# Patient Record
Sex: Male | Born: 1945 | Race: White | Hispanic: No | Marital: Married | Smoking: Never smoker
Health system: Southern US, Community
[De-identification: ages and names within clinical notes are randomized; demographics above are authoritative.]

## PROBLEM LIST (undated history)

## (undated) DIAGNOSIS — Z85828 Personal history of other malignant neoplasm of skin: Secondary | ICD-10-CM

## (undated) DIAGNOSIS — H269 Unspecified cataract: Secondary | ICD-10-CM

## (undated) DIAGNOSIS — K5730 Diverticulosis of large intestine without perforation or abscess without bleeding: Secondary | ICD-10-CM

## (undated) DIAGNOSIS — M0520 Rheumatoid vasculitis with rheumatoid arthritis of unspecified site: Secondary | ICD-10-CM

## (undated) DIAGNOSIS — L719 Rosacea, unspecified: Secondary | ICD-10-CM

## (undated) DIAGNOSIS — C61 Malignant neoplasm of prostate: Secondary | ICD-10-CM

## (undated) DIAGNOSIS — F419 Anxiety disorder, unspecified: Secondary | ICD-10-CM

## (undated) DIAGNOSIS — Z9889 Other specified postprocedural states: Secondary | ICD-10-CM

## (undated) DIAGNOSIS — E559 Vitamin D deficiency, unspecified: Secondary | ICD-10-CM

## (undated) DIAGNOSIS — K219 Gastro-esophageal reflux disease without esophagitis: Secondary | ICD-10-CM

## (undated) DIAGNOSIS — Z859 Personal history of malignant neoplasm, unspecified: Secondary | ICD-10-CM

## (undated) DIAGNOSIS — M069 Rheumatoid arthritis, unspecified: Secondary | ICD-10-CM

## (undated) DIAGNOSIS — I10 Essential (primary) hypertension: Secondary | ICD-10-CM

## (undated) DIAGNOSIS — R55 Syncope and collapse: Secondary | ICD-10-CM

## (undated) DIAGNOSIS — R351 Nocturia: Secondary | ICD-10-CM

## (undated) DIAGNOSIS — M1990 Unspecified osteoarthritis, unspecified site: Secondary | ICD-10-CM

## (undated) HISTORY — DX: Syncope and collapse: R55

## (undated) HISTORY — DX: Rosacea, unspecified: L71.9

## (undated) HISTORY — PX: MOHS SURGERY: SUR867

## (undated) HISTORY — DX: Unspecified cataract: H26.9

## (undated) HISTORY — DX: Anxiety disorder, unspecified: F41.9

## (undated) HISTORY — DX: Essential (primary) hypertension: I10

## (undated) HISTORY — DX: Rheumatoid vasculitis with rheumatoid arthritis of unspecified site: M05.20

## (undated) HISTORY — PX: TONSILLECTOMY: SUR1361

---

## 1982-11-01 HISTORY — PX: ACHILLES TENDON SURGERY: SHX542

## 1998-11-01 HISTORY — PX: VARICOSE VEIN SURGERY: SHX832

## 2005-10-13 ENCOUNTER — Ambulatory Visit: Payer: Self-pay | Admitting: Unknown Physician Specialty

## 2006-03-30 ENCOUNTER — Other Ambulatory Visit: Payer: Self-pay

## 2006-03-30 ENCOUNTER — Emergency Department: Payer: Self-pay | Admitting: Emergency Medicine

## 2006-11-01 HISTORY — PX: COLONOSCOPY: SHX174

## 2011-07-30 ENCOUNTER — Telehealth: Payer: Self-pay | Admitting: Internal Medicine

## 2011-07-30 NOTE — Telephone Encounter (Signed)
Fine to refill his Doxycycline at [facility].May have 6 refills.

## 2011-07-30 NOTE — Telephone Encounter (Signed)
Pt would like to get a refill on his med for [facility] pharmacy

## 2011-08-02 MED ORDER — DOXYCYCLINE HYCLATE 50 MG PO CAPS: 50.0000 mg | ORAL_CAPSULE | Freq: Two times a day (BID) | ORAL | Status: AC

## 2011-08-02 NOTE — Telephone Encounter (Signed)
Rx sent to pharmacy   

## 2011-08-31 ENCOUNTER — Encounter: Payer: Self-pay | Admitting: Internal Medicine

## 2011-09-09 ENCOUNTER — Other Ambulatory Visit: Payer: Self-pay | Admitting: *Deleted

## 2011-09-09 MED ORDER — FLUTICASONE PROPIONATE 50 MCG/ACT NA SUSP: 2.0000 | Freq: Every day | NASAL | Status: DC

## 2012-01-25 ENCOUNTER — Telehealth: Payer: Self-pay | Admitting: *Deleted

## 2012-01-25 NOTE — Telephone Encounter (Signed)
Pt had labs at [facility] faxed to Dr Dan Humphreys. Pt needs OV (30 min b/c he has not been seen here) set up to review labs. Nex 30 min avail is fine. THANKS

## 2012-01-25 NOTE — Telephone Encounter (Signed)
Left message on home number for pt to call office.  The cell phone is an incorrect number

## 2012-01-26 NOTE — Telephone Encounter (Signed)
Appointment 4/17 pt aware

## 2012-02-16 ENCOUNTER — Encounter: Payer: Self-pay | Admitting: Internal Medicine

## 2012-02-16 ENCOUNTER — Ambulatory Visit (INDEPENDENT_AMBULATORY_CARE_PROVIDER_SITE_OTHER): Payer: PRIVATE HEALTH INSURANCE | Admitting: Internal Medicine

## 2012-02-16 VITALS — BP 132/70 | HR 78 | Temp 98.6°F | Ht 69.0 in | Wt 205.0 lb

## 2012-02-16 DIAGNOSIS — I10 Essential (primary) hypertension: Secondary | ICD-10-CM | POA: Insufficient documentation

## 2012-02-16 DIAGNOSIS — E559 Vitamin D deficiency, unspecified: Secondary | ICD-10-CM

## 2012-02-16 DIAGNOSIS — M069 Rheumatoid arthritis, unspecified: Secondary | ICD-10-CM

## 2012-02-16 MED ORDER — METHOTREXATE POWD: Status: DC

## 2012-02-16 MED ORDER — FOLIC ACID 1 MG PO TABS: 1.0000 mg | ORAL_TABLET | Freq: Every day | ORAL | Status: DC

## 2012-02-16 MED ORDER — PREDNISONE 5 MG PO TABS: 5.0000 mg | ORAL_TABLET | Freq: Every day | ORAL | Status: AC

## 2012-02-16 MED ORDER — LOSARTAN POTASSIUM 100 MG PO TABS: 100.0000 mg | ORAL_TABLET | Freq: Every day | ORAL | Status: DC

## 2012-02-16 MED ORDER — ERGOCALCIFEROL 1.25 MG (50000 UT) PO CAPS: 50000.0000 [IU] | ORAL_CAPSULE | ORAL | Status: DC

## 2012-02-16 NOTE — Patient Instructions (Signed)
Start Vit D supplement weekly.  Repeat Vit D level in 3 months.  Follow up here in 6 months.

## 2012-02-16 NOTE — Assessment & Plan Note (Signed)
Blood pressure well-controlled today, especially considering that patient is taking prednisone. We'll continue to monitor. Patient is unsure dose of losartan, so will call with the updated medication list. Recent renal function was normal. Followup in 6 months.

## 2012-02-16 NOTE — Progress Notes (Signed)
Subjective:    Patient ID: Julian Pineda, male    DOB: 07/18/1946, 66 y.o.   MRN: [id number]  HPI  66 year old male with history of hypertension and recent diagnosis of rheumatoid arthritis presents for followup. In regards to his hypertension, he reports full compliance with his losartan. He denies any chest pain, headache, or other complaints. In regards to his recent diagnosis of rheumatoid arthritis, he notes he first developed right wrist pain and swelling after golfing. He was seen by orthopedics who referred him to rheumatology. He was noted to have markedly elevated rheumatoid factor and markers of inflammation. He was started on prednisone and methotrexate with full passive. He reports significant improvement in his symptoms with the medication changes. He also notes that recent lab work was improved with normal markers of inflammation and a reduction in rheumatoid factor. He denies any noted side effects of the medication. He notes normal energy level. He denies any new complaints today.  Outpatient Encounter Prescriptions as of 02/16/2012  Medication Sig Dispense Refill  . doxycycline (VIBRAMYCIN) 50 MG capsule Take 50 mg by mouth daily.      . fluticasone (FLONASE) 50 MCG/ACT nasal spray Place 2 sprays into the nose daily.  16 g  6  . ergocalciferol (DRISDOL) 50000 UNITS capsule Take 1 capsule (50,000 Units total) by mouth once a week.  4 capsule  12  . folic acid (FOLVITE) 1 MG tablet Take 1 tablet (1 mg total) by mouth daily.  30 tablet  3  . losartan (COZAAR) 100 MG tablet Take 1 tablet (100 mg total) by mouth daily.  90 tablet  3  . Methotrexate POWD As directed by rheumatologist (pt will call with dose) 0.99ml approx  1 g  0  . predniSONE (DELTASONE) 5 MG tablet Take 1 tablet (5 mg total) by mouth daily.  30 tablet  3     Review of Systems  Constitutional: Negative for fever, chills, activity change, appetite change, fatigue and unexpected weight change.  Eyes: Negative for  visual disturbance.  Respiratory: Negative for cough and shortness of breath.   Cardiovascular: Negative for chest pain, palpitations and leg swelling.  Gastrointestinal: Negative for abdominal pain and abdominal distention.  Genitourinary: Negative for dysuria, urgency and difficulty urinating.  Musculoskeletal: Positive for myalgias, joint swelling and arthralgias. Negative for gait problem.  Skin: Negative for color change and rash.  Hematological: Negative for adenopathy.  Psychiatric/Behavioral: Negative for sleep disturbance and dysphoric mood. The patient is not nervous/anxious.    BP 132/70  Pulse 78  Temp(Src) 98.6 F (37 C) (Oral)  Ht 5\' 9"  (1.753 m)  Wt 205 lb (92.987 kg)  BMI 30.27 kg/m2  SpO2 97%     Objective:   Physical Exam  Constitutional: He is oriented to person, place, and time. He appears well-developed and well-nourished. No distress.  HENT:  Head: Normocephalic and atraumatic.  Right Ear: External ear normal.  Left Ear: External ear normal.  Nose: Nose normal.  Mouth/Throat: Oropharynx is clear and moist. No oropharyngeal exudate.  Eyes: Conjunctivae and EOM are normal. Pupils are equal, round, and reactive to light. Right eye exhibits no discharge. Left eye exhibits no discharge. No scleral icterus.  Neck: Normal range of motion. Neck supple. No tracheal deviation present. No thyromegaly present.  Cardiovascular: Normal rate, regular rhythm and normal heart sounds.  Exam reveals no gallop and no friction rub.   No murmur heard. Pulmonary/Chest: Effort normal and breath sounds normal. No respiratory distress. He has no  wheezes. He has no rales. He exhibits no tenderness.  Abdominal: Soft. Bowel sounds are normal. He exhibits no distension. There is no tenderness.  Musculoskeletal: Normal range of motion. He exhibits no edema.  Lymphadenopathy:    He has no cervical adenopathy.  Neurological: He is alert and oriented to person, place, and time. No cranial  nerve deficit. Coordination normal.  Skin: Skin is warm and dry. No rash noted. He is not diaphoretic. No erythema. No pallor.  Psychiatric: He has a normal mood and affect. His behavior is normal. Judgment and thought content normal.          Assessment & Plan:

## 2012-02-16 NOTE — Assessment & Plan Note (Signed)
Vit D 11. Goal >20. Discussed risk of bone loss with deficiency. Will supplement Drisdol 50000IU weekly x 12 weeks, then repeat level.

## 2012-02-16 NOTE — Assessment & Plan Note (Signed)
Recent diagnosis. Followed by rheumatology. Symptoms and lab work is improved. Has followup with rheumatology next week. He will e-mail or call with update if any changes made to medications. Otherwise, continue methotrexate and folic acid. We discussed the potential risk of prednisone and it appears that he has been tapering down on this medication. Will request notes from his rheumatologist.

## 2012-02-22 ENCOUNTER — Other Ambulatory Visit: Payer: Self-pay | Admitting: *Deleted

## 2012-02-22 MED ORDER — DOXYCYCLINE HYCLATE 50 MG PO CAPS: 50.0000 mg | ORAL_CAPSULE | Freq: Every day | ORAL | Status: DC

## 2012-02-23 ENCOUNTER — Telehealth: Payer: Self-pay | Admitting: Internal Medicine

## 2012-02-23 NOTE — Telephone Encounter (Signed)
No, must have been in system incorrectly. There should be no change in medication. Please call in as previous.

## 2012-02-23 NOTE — Telephone Encounter (Signed)
We received a fax from the pharmacy stating patient has always been on 100 mg of doxycycline, but they received the refill for 50 mg tablets.  They wanted to know if the change was intentional because the patient was not aware.  Please advise.

## 2012-02-24 MED ORDER — DOXYCYCLINE HYCLATE 100 MG PO TABS: 100.0000 mg | ORAL_TABLET | Freq: Two times a day (BID) | ORAL | Status: AC

## 2012-02-24 NOTE — Telephone Encounter (Signed)
This has been changed at the pharmacy.

## 2012-04-19 ENCOUNTER — Other Ambulatory Visit: Payer: Self-pay | Admitting: Internal Medicine

## 2012-04-26 ENCOUNTER — Telehealth: Payer: Self-pay | Admitting: Internal Medicine

## 2012-04-26 NOTE — Telephone Encounter (Signed)
Patient advised as instructed via telephone. 

## 2012-04-26 NOTE — Telephone Encounter (Signed)
Labs were normal.( outside labs)

## 2012-05-26 ENCOUNTER — Encounter: Payer: Self-pay | Admitting: *Deleted

## 2012-08-18 ENCOUNTER — Telehealth: Payer: Self-pay | Admitting: *Deleted

## 2012-08-18 ENCOUNTER — Encounter: Payer: Self-pay | Admitting: Internal Medicine

## 2012-08-18 ENCOUNTER — Ambulatory Visit (INDEPENDENT_AMBULATORY_CARE_PROVIDER_SITE_OTHER): Payer: PRIVATE HEALTH INSURANCE | Admitting: Internal Medicine

## 2012-08-18 VITALS — BP 130/78 | HR 63 | Temp 98.4°F | Ht 70.0 in | Wt 203.0 lb

## 2012-08-18 DIAGNOSIS — E559 Vitamin D deficiency, unspecified: Secondary | ICD-10-CM

## 2012-08-18 DIAGNOSIS — M1712 Unilateral primary osteoarthritis, left knee: Secondary | ICD-10-CM

## 2012-08-18 DIAGNOSIS — M069 Rheumatoid arthritis, unspecified: Secondary | ICD-10-CM

## 2012-08-18 DIAGNOSIS — I10 Essential (primary) hypertension: Secondary | ICD-10-CM

## 2012-08-18 DIAGNOSIS — M1710 Unilateral primary osteoarthritis, unspecified knee: Secondary | ICD-10-CM

## 2012-08-18 NOTE — Assessment & Plan Note (Signed)
Will check Vitamin D level with labs. Follow up 6 months and prn.

## 2012-08-18 NOTE — Assessment & Plan Note (Signed)
Patient is planning to have knee replacement later this year. Will request records from orthopedic surgeon.

## 2012-08-18 NOTE — Progress Notes (Signed)
Subjective:    Patient ID: Julian Pineda, male    DOB: 1945/12/17, 66 y.o.   MRN: [id number]  HPI 66 year old male with history of rheumatoid arthritis, hypertension, vitamin D deficiency presents for followup. He reports he is doing well. He notes that his rheumatologist has increased his dose of methotrexate. He feels he is tolerating this well. He is having blood counts checked quarterly. He notes some joint stiffness in his hands and decreased grip strength in his right hand but he has been able to participate in activities he enjoys such as golfing. He denies any side effects from medication.   In regards to hypertension, he does not regularly check his blood pressure but denies any headache, palpitations, chest pain. He reports compliance with his losartan.  In regards to vitamin D deficiency, he reports he continues to take weekly high-dose vitamin D. He denies any side effects from this.  He notes that he has recently had some pain in his left knee. This was evaluated by orthopedic surgeon and he was diagnosed with osteoarthritis. Plan is for knee replacement later this year. He also notes that he has a history of torn cartilage in his right knee. Plan is for arthroscopy later this year.  Outpatient Encounter Prescriptions as of 08/18/2012  Medication Sig Dispense Refill  . doxycycline (MONODOX) 100 MG capsule TAKE ONE CAPSULE DAILY  30 capsule  11  . ergocalciferol (DRISDOL) 50000 UNITS capsule Take 1 capsule (50,000 Units total) by mouth once a week.  4 capsule  12  . fluticasone (FLONASE) 50 MCG/ACT nasal spray USE TWO PUFFS INTO EACH NOSTRIL DAILY  16 g  11  . folic acid (FOLVITE) 1 MG tablet Take 1 tablet (1 mg total) by mouth daily.  30 tablet  3  . losartan (COZAAR) 100 MG tablet Take 1 tablet (100 mg total) by mouth daily.  90 tablet  3  . Methotrexate POWD As directed by rheumatologist (pt will call with dose) 0.80ml approx  1 g  0   BP 130/78  Pulse 63  Temp 98.4 F (36.9 C)   Ht 5\' 10"  (1.778 m)  Wt 203 lb (92.08 kg)  BMI 29.13 kg/m2  SpO2 98%  Review of Systems  Constitutional: Negative for fever, chills, activity change, appetite change, fatigue and unexpected weight change.  Eyes: Negative for visual disturbance.  Respiratory: Negative for cough and shortness of breath.   Cardiovascular: Negative for chest pain, palpitations and leg swelling.  Gastrointestinal: Negative for abdominal pain and abdominal distention.  Genitourinary: Negative for dysuria, urgency and difficulty urinating.  Musculoskeletal: Positive for joint swelling and arthralgias. Negative for gait problem.  Skin: Negative for color change and rash.  Neurological: Positive for weakness.  Hematological: Negative for adenopathy.  Psychiatric/Behavioral: Negative for disturbed wake/sleep cycle and dysphoric mood. The patient is not nervous/anxious.        Objective:   Physical Exam  Constitutional: He is oriented to person, place, and time. He appears well-developed and well-nourished. No distress.  HENT:  Head: Normocephalic and atraumatic.  Right Ear: External ear normal.  Left Ear: External ear normal.  Nose: Nose normal.  Mouth/Throat: Oropharynx is clear and moist. No oropharyngeal exudate.  Eyes: Conjunctivae normal and EOM are normal. Pupils are equal, round, and reactive to light. Right eye exhibits no discharge. Left eye exhibits no discharge. No scleral icterus.  Neck: Normal range of motion. Neck supple. No tracheal deviation present. No thyromegaly present.  Cardiovascular: Normal rate, regular rhythm and normal  heart sounds.  Exam reveals no gallop and no friction rub.   No murmur heard. Pulmonary/Chest: Effort normal and breath sounds normal. No respiratory distress. He has no wheezes. He has no rales. He exhibits no tenderness.  Musculoskeletal: He exhibits no edema.       Right knee: He exhibits normal range of motion and no swelling.       Left knee: He exhibits  decreased range of motion.       Right hand: He exhibits decreased range of motion.  Lymphadenopathy:    He has no cervical adenopathy.  Neurological: He is alert and oriented to person, place, and time. No cranial nerve deficit. Coordination normal.  Skin: Skin is warm and dry. No rash noted. He is not diaphoretic. No erythema. No pallor.  Psychiatric: He has a normal mood and affect. His behavior is normal. Judgment and thought content normal.          Assessment & Plan:

## 2012-08-18 NOTE — Assessment & Plan Note (Signed)
Symptoms well controlled with methotrexate.  Will request notes from rheumatologist. Follow up here in 6 months and prn.

## 2012-08-18 NOTE — Assessment & Plan Note (Signed)
BP well controlled on losartan. Will check renal function with next labs through his employer. Follow up in 6 months and prn.

## 2012-08-18 NOTE — Telephone Encounter (Signed)
Called pt to let him no that his blood specimen had been discarded and that he can have new labs on his next visit.

## 2012-11-10 ENCOUNTER — Encounter: Payer: Self-pay | Admitting: Internal Medicine

## 2012-11-10 ENCOUNTER — Ambulatory Visit (INDEPENDENT_AMBULATORY_CARE_PROVIDER_SITE_OTHER): Payer: BC Managed Care – PPO | Admitting: Internal Medicine

## 2012-11-10 VITALS — BP 142/88 | HR 80 | Temp 99.1°F | Ht 70.0 in | Wt 208.2 lb

## 2012-11-10 DIAGNOSIS — M1710 Unilateral primary osteoarthritis, unspecified knee: Secondary | ICD-10-CM

## 2012-11-10 DIAGNOSIS — Z01818 Encounter for other preprocedural examination: Secondary | ICD-10-CM | POA: Insufficient documentation

## 2012-11-10 DIAGNOSIS — M1712 Unilateral primary osteoarthritis, left knee: Secondary | ICD-10-CM

## 2012-11-10 LAB — COMPREHENSIVE METABOLIC PANEL
ALT: 24 U/L (ref 0–53)
AST: 20 U/L (ref 0–37)
Albumin: 4.3 g/dL (ref 3.5–5.2)
BUN: 15 mg/dL (ref 6–23)
Calcium: 9.5 mg/dL (ref 8.4–10.5)
Chloride: 100 mEq/L (ref 96–112)
Potassium: 4 mEq/L (ref 3.5–5.3)
Sodium: 139 mEq/L (ref 135–145)
Total Protein: 6.1 g/dL (ref 6.0–8.3)

## 2012-11-10 LAB — CBC WITH DIFFERENTIAL/PLATELET
Basophils Absolute: 0 10*3/uL (ref 0.0–0.1)
Basophils Relative: 0 % (ref 0–1)
Eosinophils Relative: 1 % (ref 0–5)
HCT: 41.7 % (ref 39.0–52.0)
MCHC: 35.3 g/dL (ref 30.0–36.0)
MCV: 89.5 fL (ref 78.0–100.0)
Monocytes Absolute: 0.4 10*3/uL (ref 0.1–1.0)
RDW: 14.6 % (ref 11.5–15.5)

## 2012-11-10 NOTE — Assessment & Plan Note (Signed)
Low risk of perioperative cardiac events based on Modified Risk Index.  EKG normal today. Lab including CBC, CMP, coags pending. If labs normal, will recommend proceed with surgery without additional testing.

## 2012-11-10 NOTE — Progress Notes (Signed)
Subjective:    Patient ID: Julian Pineda, male    DOB: 01/29/1946, 67 y.o.   MRN: [id number]  HPI 67 year old male with history of rheumatoid arthritis and osteoarthritis as well as hypertension presents for preoperative clearance. He is scheduled to have left knee replacement later this month. He recently underwent left knee arthroscopy and tolerated this procedure well. He has never had problems with anesthesia. He denies any recent infections. He denies any recent chest pain or shortness of breath. He reports normal activity level. He has never had issues of bleeding or bruising.  Outpatient Encounter Prescriptions as of 11/10/2012  Medication Sig Dispense Refill  . doxycycline (MONODOX) 100 MG capsule TAKE ONE CAPSULE DAILY  30 capsule  11  . ergocalciferol (DRISDOL) 50000 UNITS capsule Take 1 capsule (50,000 Units total) by mouth once a week.  4 capsule  12  . fluticasone (FLONASE) 50 MCG/ACT nasal spray USE TWO PUFFS INTO EACH NOSTRIL DAILY  16 g  11  . folic acid (FOLVITE) 1 MG tablet Take 1 tablet (1 mg total) by mouth daily.  30 tablet  3  . losartan (COZAAR) 100 MG tablet Take 1 tablet (100 mg total) by mouth daily.  90 tablet  3  . Methotrexate POWD As directed by rheumatologist (pt will call with dose) 0.18ml approx  1 g  0   BP 142/88  Pulse 80  Temp 99.1 F (37.3 C) (Oral)  Ht 5\' 10"  (1.778 m)  Wt 208 lb 4 oz (94.462 kg)  BMI 29.88 kg/m2  SpO2 95%  Review of Systems  Constitutional: Negative for fever, chills, activity change, appetite change, fatigue and unexpected weight change.  Eyes: Negative for visual disturbance.  Respiratory: Negative for cough and shortness of breath.   Cardiovascular: Negative for chest pain, palpitations and leg swelling.  Gastrointestinal: Negative for abdominal pain and abdominal distention.  Genitourinary: Negative for dysuria, urgency and difficulty urinating.  Musculoskeletal: Negative for arthralgias and gait problem.  Skin: Negative for  color change and rash.  Hematological: Negative for adenopathy.  Psychiatric/Behavioral: Negative for sleep disturbance and dysphoric mood. The patient is not nervous/anxious.        Objective:   Physical Exam  Constitutional: He is oriented to person, place, and time. He appears well-developed and well-nourished. No distress.  HENT:  Head: Normocephalic and atraumatic.  Right Ear: External ear normal.  Left Ear: External ear normal.  Nose: Nose normal.  Mouth/Throat: Oropharynx is clear and moist. No oropharyngeal exudate.  Eyes: Conjunctivae normal and EOM are normal. Pupils are equal, round, and reactive to light. Right eye exhibits no discharge. Left eye exhibits no discharge. No scleral icterus.  Neck: Normal range of motion. Neck supple. No tracheal deviation present. No thyromegaly present.  Cardiovascular: Normal rate, regular rhythm and normal heart sounds.  Exam reveals no gallop and no friction rub.   No murmur heard. Pulmonary/Chest: Effort normal and breath sounds normal. No respiratory distress. He has no wheezes. He has no rales. He exhibits no tenderness.  Musculoskeletal: Normal range of motion. He exhibits no edema.  Lymphadenopathy:    He has no cervical adenopathy.  Neurological: He is alert and oriented to person, place, and time. No cranial nerve deficit. Coordination normal.  Skin: Skin is warm and dry. No rash noted. He is not diaphoretic. No erythema. No pallor.  Psychiatric: He has a normal mood and affect. His behavior is normal. Judgment and thought content normal.  Assessment & Plan:

## 2012-11-10 NOTE — Assessment & Plan Note (Signed)
OA of left knee. Plan for left knee replacement later this month.

## 2012-11-23 ENCOUNTER — Encounter (HOSPITAL_COMMUNITY): Payer: Self-pay | Admitting: Pharmacy Technician

## 2012-11-24 ENCOUNTER — Other Ambulatory Visit: Payer: Self-pay | Admitting: Orthopedic Surgery

## 2012-11-27 ENCOUNTER — Ambulatory Visit: Payer: PRIVATE HEALTH INSURANCE | Admitting: Internal Medicine

## 2012-11-29 ENCOUNTER — Ambulatory Visit (HOSPITAL_COMMUNITY)
Admission: RE | Admit: 2012-11-29 | Discharge: 2012-11-29 | Disposition: A | Discharge status: Home / Self Care | Payer: BC Managed Care – PPO | Source: Ambulatory Visit | Attending: Orthopedic Surgery | Admitting: Orthopedic Surgery

## 2012-11-29 ENCOUNTER — Encounter (HOSPITAL_COMMUNITY): Payer: Self-pay

## 2012-11-29 ENCOUNTER — Encounter (HOSPITAL_COMMUNITY)
Admission: RE | Admit: 2012-11-29 | Discharge: 2012-11-29 | Disposition: A | Discharge status: Home / Self Care | Payer: BC Managed Care – PPO | Source: Ambulatory Visit | Attending: Orthopedic Surgery | Admitting: Orthopedic Surgery

## 2012-11-29 HISTORY — DX: Gastro-esophageal reflux disease without esophagitis: K21.9

## 2012-11-29 LAB — COMPREHENSIVE METABOLIC PANEL
Alkaline Phosphatase: 57 U/L (ref 39–117)
BUN: 15 mg/dL (ref 6–23)
Chloride: 102 mEq/L (ref 96–112)
Creatinine, Ser: 0.9 mg/dL (ref 0.50–1.35)
GFR calc Af Amer: >90 mL/min (ref >90)
Glucose, Bld: 101 mg/dL — ABNORMAL HIGH (ref 70–99)
Potassium: 4.1 mEq/L (ref 3.5–5.1)
Total Bilirubin: 0.4 mg/dL (ref 0.3–1.2)

## 2012-11-29 LAB — CBC WITH DIFFERENTIAL/PLATELET
Eosinophils Absolute: 0.2 10*3/uL (ref 0.0–0.7)
HCT: 40.4 % (ref 39.0–52.0)
Hemoglobin: 13.9 g/dL (ref 13.0–17.0)
Lymphs Abs: 1 10*3/uL (ref 0.7–4.0)
MCH: 31.7 pg (ref 26.0–34.0)
MCHC: 34.4 g/dL (ref 30.0–36.0)
Monocytes Absolute: 0.4 10*3/uL (ref 0.1–1.0)
Monocytes Relative: 7 % (ref 3–12)
Neutrophils Relative %: 72 % (ref 43–77)
RBC: 4.39 MIL/uL (ref 4.22–5.81)

## 2012-11-29 LAB — URINALYSIS, ROUTINE W REFLEX MICROSCOPIC
Bilirubin Urine: NEGATIVE
Hgb urine dipstick: NEGATIVE
Ketones, ur: NEGATIVE mg/dL
Protein, ur: NEGATIVE mg/dL
Urobilinogen, UA: 0.2 mg/dL (ref 0.0–1.0)

## 2012-11-29 LAB — TYPE AND SCREEN: ABO/RH(D): A POS

## 2012-11-29 LAB — PROTIME-INR
INR: 0.94 (ref 0.00–1.49)
Prothrombin Time: 12.5 seconds (ref 11.6–15.2)

## 2012-11-29 LAB — SURGICAL PCR SCREEN
MRSA, PCR: NEGATIVE
Staphylococcus aureus: NEGATIVE

## 2012-11-29 MED ORDER — CHLORHEXIDINE GLUCONATE 4 % EX LIQD: 60.0000 mL | Freq: Once | CUTANEOUS | Status: DC

## 2012-11-29 NOTE — Pre-Procedure Instructions (Signed)
Julian Pineda  11/29/2012   Your procedure is scheduled on:  Monday December 04, 2012  Report to [facility] at 5:30 AM.  Call this number if you have problems the morning of surgery: [phone]   Remember:   Do not eat food or drink liquids after midnight.   Take these medicines the morning of surgery with A SIP OF WATER: doxycycline, flonase,    Do not wear jewelry, make-up or nail polish.  Do not wear lotions, powders, or perfumes.  Do not shave 48 hours prior to surgery. Men may shave face and neck.  Do not bring valuables to the hospital.  Contacts, dentures or bridgework may not be worn into surgery.  Leave suitcase in the car. After surgery it may be brought to your room.  For patients admitted to the hospital, checkout time is 11:00 AM the day of  discharge.   Patients discharged the day of surgery will not be allowed to drive  home.  Name and phone number of your driver: family / friend  Special Instructions: Shower using CHG 2 nights before surgery and the night before surgery.  If you shower the day of surgery use CHG.  Use special wash - you have one bottle of CHG for all showers.  You should use approximately 1/3 of the bottle for each shower.   Please read over the following fact sheets that you were given: Pain Booklet, Coughing and Deep Breathing, Blood Transfusion Information, Total Joint Packet, MRSA Information and Surgical Site Infection Prevention

## 2012-11-30 LAB — URINE CULTURE: Culture: NO GROWTH

## 2012-12-03 MED ORDER — VANCOMYCIN HCL IN DEXTROSE 1-5 GM/200ML-% IV SOLN
1000.0000 mg | INTRAVENOUS | Status: AC
  Administered 2012-12-04: 1000 mg via INTRAVENOUS
  Filled 2012-12-03: qty 200

## 2012-12-04 ENCOUNTER — Encounter (HOSPITAL_COMMUNITY): Payer: Self-pay | Admitting: Certified Registered"

## 2012-12-04 ENCOUNTER — Inpatient Hospital Stay (HOSPITAL_COMMUNITY): Payer: BC Managed Care – PPO | Admitting: Certified Registered"

## 2012-12-04 ENCOUNTER — Inpatient Hospital Stay (HOSPITAL_COMMUNITY)
Admission: RE | Admit: 2012-12-04 | Discharge: 2012-12-05 | DRG: 209 | Disposition: A | Discharge status: Home Health | Payer: BC Managed Care – PPO | Source: Ambulatory Visit | Attending: Orthopedic Surgery | Admitting: Orthopedic Surgery

## 2012-12-04 ENCOUNTER — Encounter (HOSPITAL_COMMUNITY): Payer: Self-pay

## 2012-12-04 ENCOUNTER — Encounter (HOSPITAL_COMMUNITY)
Admission: RE | Disposition: A | Discharge status: Home Health | Payer: Self-pay | Source: Ambulatory Visit | Attending: Orthopedic Surgery

## 2012-12-04 DIAGNOSIS — Z01812 Encounter for preprocedural laboratory examination: Secondary | ICD-10-CM

## 2012-12-04 DIAGNOSIS — D62 Acute posthemorrhagic anemia: Secondary | ICD-10-CM | POA: Diagnosis not present

## 2012-12-04 DIAGNOSIS — Z79899 Other long term (current) drug therapy: Secondary | ICD-10-CM

## 2012-12-04 DIAGNOSIS — K219 Gastro-esophageal reflux disease without esophagitis: Secondary | ICD-10-CM | POA: Diagnosis present

## 2012-12-04 DIAGNOSIS — E559 Vitamin D deficiency, unspecified: Secondary | ICD-10-CM | POA: Diagnosis present

## 2012-12-04 DIAGNOSIS — M1710 Unilateral primary osteoarthritis, unspecified knee: Principal | ICD-10-CM | POA: Diagnosis present

## 2012-12-04 DIAGNOSIS — I10 Essential (primary) hypertension: Secondary | ICD-10-CM | POA: Diagnosis present

## 2012-12-04 DIAGNOSIS — M069 Rheumatoid arthritis, unspecified: Secondary | ICD-10-CM | POA: Diagnosis present

## 2012-12-04 DIAGNOSIS — M1712 Unilateral primary osteoarthritis, left knee: Secondary | ICD-10-CM

## 2012-12-04 DIAGNOSIS — Z85828 Personal history of other malignant neoplasm of skin: Secondary | ICD-10-CM

## 2012-12-04 DIAGNOSIS — Z01811 Encounter for preprocedural respiratory examination: Secondary | ICD-10-CM

## 2012-12-04 HISTORY — PX: TOTAL KNEE ARTHROPLASTY: SHX125

## 2012-12-04 LAB — CBC
MCH: 31.6 pg (ref 26.0–34.0)
MCV: 92 fL (ref 78.0–100.0)
Platelets: 261 10*3/uL (ref 150–400)
RBC: 4.15 MIL/uL — ABNORMAL LOW (ref 4.22–5.81)

## 2012-12-04 SURGERY — ARTHROPLASTY, KNEE, TOTAL
Anesthesia: General | Site: Knee | Laterality: Left | Wound class: Clean

## 2012-12-04 MED ORDER — PHENOL 1.4 % MT LIQD: 1.0000 | OROMUCOSAL | Status: DC | PRN

## 2012-12-04 MED ORDER — ALUM & MAG HYDROXIDE-SIMETH 200-200-20 MG/5ML PO SUSP: 30.0000 mL | ORAL | Status: DC | PRN

## 2012-12-04 MED ORDER — ACETAMINOPHEN 10 MG/ML IV SOLN
INTRAVENOUS | Status: AC
  Filled 2012-12-04: qty 100

## 2012-12-04 MED ORDER — HYDROMORPHONE HCL PF 1 MG/ML IJ SOLN
0.2500 mg | INTRAMUSCULAR | Status: DC | PRN
  Administered 2012-12-04 (×3): 0.5 mg via INTRAVENOUS

## 2012-12-04 MED ORDER — OXYCODONE HCL 5 MG/5ML PO SOLN: 5.0000 mg | Freq: Once | ORAL | Status: DC | PRN

## 2012-12-04 MED ORDER — SODIUM CHLORIDE 0.9 % IV SOLN: INTRAVENOUS | Status: DC

## 2012-12-04 MED ORDER — DIPHENHYDRAMINE HCL 12.5 MG/5ML PO ELIX: 12.5000 mg | ORAL_SOLUTION | ORAL | Status: DC | PRN

## 2012-12-04 MED ORDER — BISACODYL 5 MG PO TBEC: 5.0000 mg | DELAYED_RELEASE_TABLET | Freq: Every day | ORAL | Status: DC | PRN

## 2012-12-04 MED ORDER — BUPIVACAINE-EPINEPHRINE 0.25% -1:200000 IJ SOLN
INTRAMUSCULAR | Status: DC | PRN
  Administered 2012-12-04: 20 mL

## 2012-12-04 MED ORDER — ACETAMINOPHEN 325 MG PO TABS: 650.0000 mg | ORAL_TABLET | Freq: Four times a day (QID) | ORAL | Status: DC | PRN

## 2012-12-04 MED ORDER — HYDROMORPHONE HCL PF 1 MG/ML IJ SOLN: 0.5000 mg | INTRAMUSCULAR | Status: DC | PRN

## 2012-12-04 MED ORDER — BUPIVACAINE 0.25 % ON-Q PUMP DUAL CATH 300 ML
300.0000 mL | INJECTION | Status: DC
  Filled 2012-12-04: qty 300

## 2012-12-04 MED ORDER — METOCLOPRAMIDE HCL 10 MG PO TABS: 5.0000 mg | ORAL_TABLET | Freq: Three times a day (TID) | ORAL | Status: DC | PRN

## 2012-12-04 MED ORDER — OXYCODONE HCL 5 MG PO TABS: 5.0000 mg | ORAL_TABLET | Freq: Once | ORAL | Status: DC | PRN

## 2012-12-04 MED ORDER — BUPIVACAINE 0.25 % ON-Q PUMP SINGLE CATH 300ML
INJECTION | Status: DC | PRN
  Administered 2012-12-04: 300 mL

## 2012-12-04 MED ORDER — ACETAMINOPHEN 10 MG/ML IV SOLN
1000.0000 mg | Freq: Four times a day (QID) | INTRAVENOUS | Status: AC
  Administered 2012-12-04 – 2012-12-05 (×4): 1000 mg via INTRAVENOUS
  Filled 2012-12-04 (×4): qty 100

## 2012-12-04 MED ORDER — CEFAZOLIN SODIUM-DEXTROSE 2-3 GM-% IV SOLR
2.0000 g | Freq: Four times a day (QID) | INTRAVENOUS | Status: AC
  Administered 2012-12-04 (×2): 2 g via INTRAVENOUS
  Filled 2012-12-04 (×3): qty 50

## 2012-12-04 MED ORDER — METOCLOPRAMIDE HCL 5 MG/ML IJ SOLN: 5.0000 mg | Freq: Three times a day (TID) | INTRAMUSCULAR | Status: DC | PRN

## 2012-12-04 MED ORDER — ONDANSETRON HCL 4 MG/2ML IJ SOLN: 4.0000 mg | Freq: Four times a day (QID) | INTRAMUSCULAR | Status: DC | PRN

## 2012-12-04 MED ORDER — DOXYCYCLINE HYCLATE 100 MG PO TABS
100.0000 mg | ORAL_TABLET | Freq: Every day | ORAL | Status: DC
  Administered 2012-12-04 – 2012-12-05 (×2): 100 mg via ORAL
  Filled 2012-12-04 (×2): qty 1

## 2012-12-04 MED ORDER — DOCUSATE SODIUM 100 MG PO CAPS
100.0000 mg | ORAL_CAPSULE | Freq: Two times a day (BID) | ORAL | Status: DC
  Administered 2012-12-04 – 2012-12-05 (×3): 100 mg via ORAL
  Filled 2012-12-04 (×4): qty 1

## 2012-12-04 MED ORDER — BUPIVACAINE HCL (PF) 0.5 % IJ SOLN
INTRAMUSCULAR | Status: DC | PRN
  Administered 2012-12-04: 150 mg

## 2012-12-04 MED ORDER — METHOCARBAMOL 500 MG PO TABS
500.0000 mg | ORAL_TABLET | Freq: Four times a day (QID) | ORAL | Status: DC | PRN
  Filled 2012-12-04: qty 1

## 2012-12-04 MED ORDER — HYDROMORPHONE HCL PF 1 MG/ML IJ SOLN
INTRAMUSCULAR | Status: AC
  Filled 2012-12-04: qty 1

## 2012-12-04 MED ORDER — METHOCARBAMOL 100 MG/ML IJ SOLN
500.0000 mg | Freq: Four times a day (QID) | INTRAVENOUS | Status: DC | PRN
  Administered 2012-12-04: 500 mg via INTRAVENOUS
  Filled 2012-12-04: qty 5

## 2012-12-04 MED ORDER — DEXAMETHASONE SODIUM PHOSPHATE 4 MG/ML IJ SOLN
INTRAMUSCULAR | Status: DC | PRN
  Administered 2012-12-04: 10 mg via INTRAVENOUS

## 2012-12-04 MED ORDER — SODIUM CHLORIDE 0.9 % IR SOLN
Status: DC | PRN
  Administered 2012-12-04: 3000 mL

## 2012-12-04 MED ORDER — PROPOFOL 10 MG/ML IV BOLUS
INTRAVENOUS | Status: DC | PRN
  Administered 2012-12-04: 200 mg via INTRAVENOUS

## 2012-12-04 MED ORDER — ACETAMINOPHEN 650 MG RE SUPP: 650.0000 mg | Freq: Four times a day (QID) | RECTAL | Status: DC | PRN

## 2012-12-04 MED ORDER — BUPIVACAINE-EPINEPHRINE PF 0.25-1:200000 % IJ SOLN
INTRAMUSCULAR | Status: AC
  Filled 2012-12-04: qty 30

## 2012-12-04 MED ORDER — CALCIUM CARBONATE ANTACID 500 MG PO CHEW
2.0000 | CHEWABLE_TABLET | Freq: Two times a day (BID) | ORAL | Status: DC | PRN
  Filled 2012-12-04: qty 2

## 2012-12-04 MED ORDER — LOSARTAN POTASSIUM 50 MG PO TABS
100.0000 mg | ORAL_TABLET | Freq: Every day | ORAL | Status: DC
  Administered 2012-12-04 – 2012-12-05 (×2): 100 mg via ORAL
  Filled 2012-12-04 (×3): qty 2

## 2012-12-04 MED ORDER — BUPIVACAINE 0.25 % ON-Q PUMP SINGLE CATH 300ML
300.0000 mL | INJECTION | Status: DC
  Filled 2012-12-04: qty 300

## 2012-12-04 MED ORDER — ONDANSETRON HCL 4 MG PO TABS: 4.0000 mg | ORAL_TABLET | Freq: Four times a day (QID) | ORAL | Status: DC | PRN

## 2012-12-04 MED ORDER — SUFENTANIL CITRATE 50 MCG/ML IV SOLN
INTRAVENOUS | Status: DC | PRN
  Administered 2012-12-04: 5 ug via INTRAVENOUS
  Administered 2012-12-04 (×2): 10 ug via INTRAVENOUS

## 2012-12-04 MED ORDER — MENTHOL 3 MG MT LOZG: 1.0000 | LOZENGE | OROMUCOSAL | Status: DC | PRN

## 2012-12-04 MED ORDER — FLUTICASONE PROPIONATE 50 MCG/ACT NA SUSP
2.0000 | Freq: Every day | NASAL | Status: DC | PRN
  Filled 2012-12-04: qty 16

## 2012-12-04 MED ORDER — LACTATED RINGERS IV SOLN
INTRAVENOUS | Status: DC | PRN
  Administered 2012-12-04 (×2): via INTRAVENOUS

## 2012-12-04 MED ORDER — PROMETHAZINE HCL 25 MG/ML IJ SOLN: 6.2500 mg | INTRAMUSCULAR | Status: DC | PRN

## 2012-12-04 MED ORDER — ACETAMINOPHEN 10 MG/ML IV SOLN
1000.0000 mg | Freq: Four times a day (QID) | INTRAVENOUS | Status: DC
  Administered 2012-12-04: 1000 mg via INTRAVENOUS
  Filled 2012-12-04 (×3): qty 100

## 2012-12-04 MED ORDER — ZOLPIDEM TARTRATE 5 MG PO TABS: 5.0000 mg | ORAL_TABLET | Freq: Every evening | ORAL | Status: DC | PRN

## 2012-12-04 MED ORDER — OXYCODONE HCL ER 10 MG PO T12A
20.0000 mg | EXTENDED_RELEASE_TABLET | Freq: Two times a day (BID) | ORAL | Status: DC
  Administered 2012-12-04 – 2012-12-05 (×3): 20 mg via ORAL
  Filled 2012-12-04 (×3): qty 2

## 2012-12-04 MED ORDER — ENOXAPARIN SODIUM 30 MG/0.3ML ~~LOC~~ SOLN
30.0000 mg | Freq: Two times a day (BID) | SUBCUTANEOUS | Status: DC
  Administered 2012-12-05: 30 mg via SUBCUTANEOUS
  Filled 2012-12-04 (×3): qty 0.3

## 2012-12-04 MED ORDER — MIDAZOLAM HCL 5 MG/5ML IJ SOLN
INTRAMUSCULAR | Status: DC | PRN
  Administered 2012-12-04: 2 mg via INTRAVENOUS

## 2012-12-04 MED ORDER — 0.9 % SODIUM CHLORIDE (POUR BTL) OPTIME
TOPICAL | Status: DC | PRN
  Administered 2012-12-04: 1000 mL

## 2012-12-04 MED ORDER — OXYCODONE HCL 5 MG PO TABS: 5.0000 mg | ORAL_TABLET | ORAL | Status: DC | PRN

## 2012-12-04 MED ORDER — CELECOXIB 200 MG PO CAPS
200.0000 mg | ORAL_CAPSULE | Freq: Two times a day (BID) | ORAL | Status: DC
  Administered 2012-12-04 – 2012-12-05 (×3): 200 mg via ORAL
  Filled 2012-12-04 (×5): qty 1

## 2012-12-04 SURGICAL SUPPLY — 60 items
BANDAGE ELASTIC 6 VELCRO ST LF (GAUZE/BANDAGES/DRESSINGS) ×2 IMPLANT
BANDAGE ESMARK 6X9 LF (GAUZE/BANDAGES/DRESSINGS) ×1 IMPLANT
BLADE SAGITTAL 13X1.27X60 (BLADE) ×2 IMPLANT
BLADE SAW SGTL 83.5X18.5 (BLADE) ×2 IMPLANT
BNDG ESMARK 6X9 LF (GAUZE/BANDAGES/DRESSINGS) ×2
BOWL SMART MIX CTS (DISPOSABLE) ×2 IMPLANT
CATH KIT ON Q 5IN SLV (PAIN MANAGEMENT) ×2 IMPLANT
CEMENT BONE SIMPLEX SPEEDSET (Cement) ×4 IMPLANT
CLOTH BEACON ORANGE TIMEOUT ST (SAFETY) ×2 IMPLANT
COVER BACK TABLE 24X17X13 BIG (DRAPES) ×2 IMPLANT
COVER SURGICAL LIGHT HANDLE (MISCELLANEOUS) ×2 IMPLANT
CUFF TOURNIQUET SINGLE 34IN LL (TOURNIQUET CUFF) ×2 IMPLANT
DRAPE EXTREMITY T 121X128X90 (DRAPE) ×2 IMPLANT
DRAPE INCISE IOBAN 66X45 STRL (DRAPES) ×4 IMPLANT
DRAPE PROXIMA HALF (DRAPES) ×2 IMPLANT
DRAPE U-SHAPE 47X51 STRL (DRAPES) ×2 IMPLANT
DRSG ADAPTIC 3X8 NADH LF (GAUZE/BANDAGES/DRESSINGS) ×2 IMPLANT
DRSG PAD ABDOMINAL 8X10 ST (GAUZE/BANDAGES/DRESSINGS) ×2 IMPLANT
DURAPREP 26ML APPLICATOR (WOUND CARE) ×4 IMPLANT
ELECT REM PT RETURN 9FT ADLT (ELECTROSURGICAL) ×2
ELECTRODE REM PT RTRN 9FT ADLT (ELECTROSURGICAL) ×1 IMPLANT
EVACUATOR 1/8 PVC DRAIN (DRAIN) ×2 IMPLANT
GLOVE BIOGEL M 7.0 STRL (GLOVE) ×2 IMPLANT
GLOVE BIOGEL PI IND STRL 7.5 (GLOVE) ×1 IMPLANT
GLOVE BIOGEL PI IND STRL 8.5 (GLOVE) IMPLANT
GLOVE BIOGEL PI INDICATOR 7.5 (GLOVE) ×1
GLOVE BIOGEL PI INDICATOR 8.5 (GLOVE)
GLOVE BIOGEL PI ORTHO PRO SZ8 (GLOVE) ×1
GLOVE PI ORTHO PRO STRL SZ8 (GLOVE) ×1 IMPLANT
GLOVE SURG ORTHO 8.0 STRL STRW (GLOVE) ×2 IMPLANT
GOWN PREVENTION PLUS XLARGE (GOWN DISPOSABLE) ×4 IMPLANT
GOWN STRL NON-REIN LRG LVL3 (GOWN DISPOSABLE) ×4 IMPLANT
HANDPIECE INTERPULSE COAX TIP (DISPOSABLE) ×1
HOOD PEEL AWAY FACE SHEILD DIS (HOOD) ×8 IMPLANT
KIT BASIN OR (CUSTOM PROCEDURE TRAY) ×2 IMPLANT
KIT ROOM TURNOVER OR (KITS) ×2 IMPLANT
MANIFOLD NEPTUNE II (INSTRUMENTS) ×2 IMPLANT
NEEDLE 22X1 1/2 (OR ONLY) (NEEDLE) IMPLANT
NS IRRIG 1000ML POUR BTL (IV SOLUTION) ×2 IMPLANT
PACK TOTAL JOINT (CUSTOM PROCEDURE TRAY) ×2 IMPLANT
PAD ARMBOARD 7.5X6 YLW CONV (MISCELLANEOUS) ×4 IMPLANT
PAD CAST 4YDX4 CTTN HI CHSV (CAST SUPPLIES) ×1 IMPLANT
PADDING CAST COTTON 4X4 STRL (CAST SUPPLIES) ×1
PADDING CAST COTTON 6X4 STRL (CAST SUPPLIES) ×2 IMPLANT
POSITIONER HEAD PRONE TRACH (MISCELLANEOUS) IMPLANT
SET HNDPC FAN SPRY TIP SCT (DISPOSABLE) ×1 IMPLANT
SPONGE GAUZE 4X4 12PLY (GAUZE/BANDAGES/DRESSINGS) ×2 IMPLANT
STAPLER VISISTAT 35W (STAPLE) ×2 IMPLANT
SUCTION FRAZIER TIP 10 FR DISP (SUCTIONS) ×2 IMPLANT
SUT BONE WAX W31G (SUTURE) ×2 IMPLANT
SUT VIC AB 0 CTB1 27 (SUTURE) ×4 IMPLANT
SUT VIC AB 1 CT1 27 (SUTURE) ×3
SUT VIC AB 1 CT1 27XBRD ANBCTR (SUTURE) ×3 IMPLANT
SUT VIC AB 2-0 CT1 27 (SUTURE) ×2
SUT VIC AB 2-0 CT1 TAPERPNT 27 (SUTURE) ×2 IMPLANT
SYR CONTROL 10ML LL (SYRINGE) IMPLANT
TOWEL OR 17X24 6PK STRL BLUE (TOWEL DISPOSABLE) ×2 IMPLANT
TOWEL OR 17X26 10 PK STRL BLUE (TOWEL DISPOSABLE) ×2 IMPLANT
TRAY FOLEY CATH 14FR (SET/KITS/TRAYS/PACK) ×2 IMPLANT
WATER STERILE IRR 1000ML POUR (IV SOLUTION) ×2 IMPLANT

## 2012-12-04 NOTE — Anesthesia Procedure Notes (Signed)
Anesthesia Regional Block:  Femoral nerve block  Pre-Anesthetic Checklist: ,, timeout performed, Correct Patient, Correct Site, Correct Laterality, Correct Procedure,, site marked, risks and benefits discussed, Surgical consent,  Pre-op evaluation,  At surgeon's request and post-op pain management  Laterality: Left  Prep: chloraprep       Needles:  Injection technique: Single-shot  Needle Type: Echogenic Stimulator Needle     Needle Length: 5cm 5 cm Needle Gauge: 22 and 22 G    Additional Needles:  Procedures: ultrasound guided (picture in chart) and nerve stimulator Femoral nerve block  Nerve Stimulator or Paresthesia:  Response: quadraceps contraction, 0.45 mA,   Additional Responses:   Narrative:  Start time: 12/04/2012 7:00 AM End time: 12/04/2012 7:10 AM Injection made incrementally with aspirations every 5 mL.  Performed by: Personally  Anesthesiologist: Halford Decamp, MD  Additional Notes: Functioning IV was confirmed and monitors were applied.  A 50mm 22ga Arrow echogenic stimulator needle was used. Sterile prep and drape,hand hygiene and sterile gloves were used. Ultrasound guidance: relevant anatomy identified, needle position confirmed, local anesthetic spread visualized around nerve(s)., vascular puncture avoided.  Image printed for medical record. Negative aspiration and negative test dose prior to incremental administration of local anesthetic. The patient tolerated the procedure well.    Femoral nerve block

## 2012-12-04 NOTE — Anesthesia Preprocedure Evaluation (Addendum)
Anesthesia Evaluation  Patient identified by MRN, date of birth, ID band Patient awake    Reviewed: Allergy & Precautions, H&P , NPO status , Patient's Chart, lab work & pertinent test results, reviewed documented beta blocker date and time   History of Anesthesia Complications Negative for: history of anesthetic complications  Airway Mallampati: II TM Distance: >3 FB Neck ROM: Full    Dental  (+) Teeth Intact and Dental Advisory Given   Pulmonary neg pulmonary ROS,    Pulmonary exam normal       Cardiovascular hypertension, Pt. on medications     Neuro/Psych negative neurological ROS  negative psych ROS   GI/Hepatic Neg liver ROS, GERD-  Medicated and Controlled,  Endo/Other  negative endocrine ROS  Renal/GU negative Renal ROS     Musculoskeletal  (+) Arthritis -, Rheumatoid disorders,    Abdominal   Peds  Hematology negative hematology ROS (+)   Anesthesia Other Findings   Reproductive/Obstetrics                         Anesthesia Physical Anesthesia Plan  ASA: II  Anesthesia Plan: General   Post-op Pain Management:    Induction: Intravenous  Airway Management Planned: LMA  Additional Equipment:   Intra-op Plan:   Post-operative Plan: Extubation in OR  Informed Consent: I have reviewed the patients History and Physical, chart, labs and discussed the procedure including the risks, benefits and alternatives for the proposed anesthesia with the patient or authorized representative who has indicated his/her understanding and acceptance.   Dental advisory given  Plan Discussed with: CRNA, Anesthesiologist and Surgeon  Anesthesia Plan Comments:         Anesthesia Quick Evaluation

## 2012-12-04 NOTE — H&P (Signed)
Julian Pineda MRN:  [id number] DOB/SEX:  Jan 08, 1946/male  CHIEF COMPLAINT:  Painful left Knee  HISTORY: Patient is a 67 y.o. male presented with a history of pain in the left knee. Onset of symptoms was gradual starting several years ago with gradually worsening course since that time. Prior procedures on the knee include meniscectomy. Patient has been treated conservatively with over-the-counter NSAIDs and activity modification. Patient currently rates pain in the knee at 9 out of 10 with activity. There is pain at night.  PAST MEDICAL HISTORY: Patient Active Problem List   Diagnosis Date Noted  . Preop examination 11/10/2012  . Osteoarthritis of left knee 08/18/2012  . Rheumatoid arthritis 02/16/2012  . Vitamin D deficiency disease 02/16/2012  . Hypertension 02/16/2012   Past Medical History  Diagnosis Date  . Prostate disease     followed by Dr. Achilles Dunk , s/p biopsy complicated by infection  . Rosacea     Dr. Jarold Motto  . 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 Hypertension     sees Dr. Danella Maiers, [place]  . GERD (gastroesophageal reflux disease)     "occas"  . Cancer     "skin Ca lesion removed:"  . Chronic back pain     hx of  . Rheumatoid arteritis    Past Surgical History  Procedure Date  . Vein surgery     Left leg, Dr. Priscille Heidelberg  . Achilles tendon surgery   . Tonsillectomy   . Wisdom extractions      MEDICATIONS:   Prescriptions prior to admission  Medication Sig Dispense Refill  . calcium carbonate (TUMS) 500 MG chewable tablet Chew 2 tablets by mouth 2 (two) times daily as needed. Indigestion      . diclofenac sodium (VOLTAREN) 1 % GEL Apply 2 g topically daily as needed. For pain      . doxycycline (VIBRAMYCIN) 100 MG capsule Take 100 mg by mouth daily.      . ergocalciferol (VITAMIN D2) 50000 UNITS capsule Take 50,000 Units by mouth once a week. On Mondays      . fluticasone (FLONASE) 50 MCG/ACT nasal spray Place 2 sprays into the nose daily  as needed. For allergies      . folic acid (FOLVITE) 1 MG tablet Take 1 mg by mouth daily.      Marland Kitchen losartan (COZAAR) 100 MG tablet Take 100 mg by mouth daily.      . methotrexate 25 MG/ML SOLN Inject 25 mg into the muscle once a week. On Mondays      . naproxen sodium (ANAPROX) 220 MG tablet Take 220 mg by mouth 2 (two) times daily as needed. For pain        ALLERGIES:   Allergies  Allergen Reactions  . Penicillins Cross Reactors Other (See Comments)    unknown    REVIEW OF SYSTEMS:  Pertinent items are noted in HPI.   FAMILY HISTORY:   Family History  Problem Relation Age of Onset  . Cancer Mother     Multiple Myeloma  . Alcohol abuse Father     SOCIAL HISTORY:   History  Substance Use Topics  . Smoking status: Never Smoker   . Smokeless tobacco: Former Neurosurgeon  . Alcohol Use: Yes     Comment: "social"     EXAMINATION:  Vital signs in last 24 hours: Temp:  [97.5 F (36.4 C)] 97.5 F (36.4 C) (02/03 0615) Pulse Rate:  [67] 67  (02/03 0615) Resp:  [16] 16  (  02/03 0615) BP: (128)/(74) 128/74 mmHg (02/03 0615) SpO2:  [97 %] 97 % (02/03 0615)  General appearance: alert, cooperative and no distress Lungs: clear to auscultation bilaterally Heart: regular rate and rhythm, S1, S2 normal, no murmur, click, rub or gallop Abdomen: soft, non-tender; bowel sounds normal; no masses,  no organomegaly Extremities: extremities normal, atraumatic, no cyanosis or edema and Homans sign is negative, no sign of DVT Pulses: 2+ and symmetric Skin: Skin color, texture, turgor normal. No rashes or lesions Neurologic: Alert and oriented X 3, normal strength and tone. Normal symmetric reflexes. Normal coordination and gait  Musculoskeletal:  ROM 0-115, Ligaments intact,  Imaging Review Plain radiographs demonstrate severe degenerative joint disease of the left knee. The overall alignment is significant varus. The bone quality appears to be good for age and reported activity  level.  Assessment/Plan: End stage arthritis, left knee   The patient history, physical examination and imaging studies are consistent with advanced degenerative joint disease of the left knee. The patient has failed conservative treatment.  The clearance notes were reviewed.  After discussion with the patient it was felt that Total Knee Replacement was indicated. The procedure,  risks, and benefits of total knee arthroplasty were presented and reviewed. The risks including but not limited to aseptic loosening, infection, blood clots, vascular injury, stiffness, patella tracking problems complications among others were discussed. The patient acknowledged the explanation, agreed to proceed with the plan.  Marten Iles 12/04/2012, 6:26 AM

## 2012-12-04 NOTE — Anesthesia Postprocedure Evaluation (Signed)
Anesthesia Post Note  Patient: Julian Pineda  Procedure(s) Performed: Procedure(s) (LRB): TOTAL KNEE ARTHROPLASTY (Left)  Anesthesia type: general  Patient location: PACU  Post pain: Pain level controlled  Post assessment: Patient's Cardiovascular Status Stable  Last Vitals:  Filed Vitals:   12/04/12 1000  BP:   Pulse: 65  Temp:   Resp: 10    Post vital signs: Reviewed and stable  Level of consciousness: sedated  Complications: No apparent anesthesia complications

## 2012-12-04 NOTE — Evaluation (Signed)
Physical Therapy Evaluation Patient Details Name: Julian Pineda MRN: [id number] DOB: 1946/08/25 Today's Date: 12/04/2012 Time: 2130-8657 PT Time Calculation (min): 16 min  PT Assessment / Plan / Recommendation Clinical Impression  Pt admitted s/p L tKA and mobilizing well. Pt limited only by knee buckling today with nerve block and able to perform HEP supine unassisted with handout provided. Anticipate quick progression for discharge. Will follow to maximize strength, function and ROM prior to discharge to increase independence and decrease burden of care.     PT Assessment  Patient needs continued PT services    Follow Up Recommendations  Home health PT    Does the patient have the potential to tolerate intense rehabilitation      Barriers to Discharge None      Equipment Recommendations  Rolling walker with 5" wheels;Other (comment) (3in1)    Recommendations for Other Services     Frequency 7X/week    Precautions / Restrictions Precautions Precautions: Knee   Pertinent Vitals/Pain No pain, just fatigue      Mobility  Bed Mobility Bed Mobility: Supine to Sit Supine to Sit: 6: Modified independent (Device/Increase time);HOB flat;With rails Details for Bed Mobility Assistance: pt able to use rail and pivot to edge unassisted Transfers Transfers: Sit to Stand;Stand to Sit;Stand Pivot Transfers Sit to Stand: 4: Min assist;From bed Stand to Sit: 4: Min assist;To chair/3-in-1;With armrests Stand Pivot Transfers: 4: Min assist Details for Transfer Assistance: cueing for hand placement, blocking LLE with standing and pivot due to knee buckling with weight bearing, cueing for sliding leg forward to sit and use of armrests. Use of RW for pivot Ambulation/Gait Ambulation/Gait Assistance: Not tested (comment) (secondary to LLE buckling) Stairs: No    Exercises General Exercises - Lower Extremity Ankle Circles/Pumps: AROM;Left;5 reps;Supine Quad Sets: AROM;Left;10  reps;Supine Heel Slides: AROM;Left;10 reps;Supine   PT Diagnosis: Difficulty walking  PT Problem List: Decreased strength;Decreased activity tolerance;Decreased mobility;Decreased knowledge of precautions;Decreased knowledge of use of DME PT Treatment Interventions: DME instruction;Gait training;Functional mobility training;Therapeutic activities;Therapeutic exercise;Patient/family education   PT Goals Acute Rehab PT Goals PT Goal Formulation: With patient/family Time For Goal Achievement: 12/11/12 Potential to Achieve Goals: Good Pt will go Sit to Stand: with modified independence PT Goal: Sit to Stand - Progress: Goal set today Pt will go Stand to Sit: with modified independence PT Goal: Stand to Sit - Progress: Goal set today Pt will Ambulate: >150 feet;with modified independence;with least restrictive assistive device PT Goal: Ambulate - Progress: Goal set today Pt will Perform Home Exercise Program: Independently PT Goal: Perform Home Exercise Program - Progress: Goal set today  Visit Information  Last PT Received On: 12/04/12 Assistance Needed: +1    Subjective Data  Subjective: I feel pretty good Patient Stated Goal: return to golf   Prior Functioning  Home Living Lives With: Spouse Available Help at Discharge: Family;Available 24 hours/day Type of Home: House Home Access: Level entry Home Layout: One level Bathroom Shower/Tub: Engineer, manufacturing systems: Standard Home Adaptive Equipment: Crutches Prior Function Level of Independence: Independent Able to Take Stairs?: Yes Driving: Yes Vocation: Part time employment 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, advertising Overall Cognitive Status: Appears within functional limits for tasks assessed/performed Arousal/Alertness: Awake/alert Orientation Level: Appears intact for tasks assessed Behavior During Session: [facility] for tasks performed     Extremity/Trunk Assessment Right Upper Extremity Assessment RUE ROM/Strength/Tone: [facility] for tasks assessed Left Upper Extremity Assessment LUE ROM/Strength/Tone: [facility] for tasks  assessed Right Lower Extremity Assessment RLE ROM/Strength/Tone: Within functional levels RLE Sensation: WFL - Light Touch Left Lower Extremity Assessment LLE ROM/Strength/Tone: Deficits LLE ROM/Strength/Tone Deficits: pt able to bend and extend knee but not able to maintain control in weight bearing due to nerve block s/p TKA LLE Sensation: WFL - Light Touch Trunk Assessment Trunk Assessment: Normal   Balance    End of Session PT - End of Session Equipment Utilized During Treatment: Gait belt Activity Tolerance: Patient tolerated treatment well Patient left: in chair;with call bell/phone within reach;with family/visitor present Nurse Communication: Mobility status CPM Left Knee CPM Left Knee: Off Left Knee Flexion (Degrees): 60  Left Knee Extension (Degrees): 0  Additional Comments: trapeze bar  GP     Toney Sang Beth 12/04/2012, 2:43 PM  Delaney Meigs, PT [phone]

## 2012-12-04 NOTE — Preoperative (Signed)
Beta Blockers   Reason not to administer Beta Blockers:Not Applicable 

## 2012-12-04 NOTE — Transfer of Care (Signed)
Immediate Anesthesia Transfer of Care Note  Patient: Julian Pineda  Procedure(s) Performed: Procedure(s) (LRB) with comments: TOTAL KNEE ARTHROPLASTY (Left) - left total knee arthroplasty  Patient Location: PACU  Anesthesia Type:General  Level of Consciousness: awake  Airway & Oxygen Therapy: Patient Spontanous Breathing and Patient connected to nasal cannula oxygen  Post-op Assessment: Report given to PACU RN, Post -op Vital signs reviewed and stable and Patient moving all extremities  Post vital signs: Reviewed and stable  Complications: No apparent anesthesia complications

## 2012-12-04 NOTE — Progress Notes (Signed)
Orthopedic Tech Progress Note Patient Details:  Julian Pineda 1946-01-28 [id number]  CPM Left Knee CPM Left Knee: On Left Knee Flexion (Degrees): 60  Left Knee Extension (Degrees): 0  Additional Comments: trapeze bar   Shawnie Pons 12/04/2012, 12:57 PM

## 2012-12-05 LAB — BASIC METABOLIC PANEL
BUN: 19 mg/dL (ref 6–23)
CO2: 25 mEq/L (ref 19–32)
Chloride: 101 mEq/L (ref 96–112)
Creatinine, Ser: 0.93 mg/dL (ref 0.50–1.35)
Glucose, Bld: 125 mg/dL — ABNORMAL HIGH (ref 70–99)

## 2012-12-05 LAB — CBC
HCT: 33.3 % — ABNORMAL LOW (ref 39.0–52.0)
Hemoglobin: 11.6 g/dL — ABNORMAL LOW (ref 13.0–17.0)
MCV: 91 fL (ref 78.0–100.0)
RBC: 3.66 MIL/uL — ABNORMAL LOW (ref 4.22–5.81)
RDW: 13.3 % (ref 11.5–15.5)
WBC: 14.5 10*3/uL — ABNORMAL HIGH (ref 4.0–10.5)

## 2012-12-05 MED ORDER — OXYCODONE HCL ER 20 MG PO T12A: 10.0000 mg | EXTENDED_RELEASE_TABLET | Freq: Two times a day (BID) | ORAL | Status: DC

## 2012-12-05 MED ORDER — OXYCODONE HCL 5 MG PO TABS: 5.0000 mg | ORAL_TABLET | ORAL | Status: DC | PRN

## 2012-12-05 MED ORDER — METHOCARBAMOL 500 MG PO TABS: 500.0000 mg | ORAL_TABLET | Freq: Four times a day (QID) | ORAL | Status: DC | PRN

## 2012-12-05 MED ORDER — CELECOXIB 200 MG PO CAPS: 200.0000 mg | ORAL_CAPSULE | Freq: Two times a day (BID) | ORAL | Status: DC

## 2012-12-05 MED ORDER — ENOXAPARIN SODIUM 40 MG/0.4ML ~~LOC~~ SOLN: 40.0000 mg | Freq: Every day | SUBCUTANEOUS | Status: DC

## 2012-12-05 NOTE — Care Management Note (Signed)
    Page 1 of 2   12/05/2012     1:40:08 PM   CARE MANAGEMENT NOTE 12/05/2012  Patient:  Julian Pineda, Julian Pineda   Account Number:  [id number]  Date Initiated:  12/04/2012  Documentation initiated by:  Anette Guarneri  Subjective/Objective Assessment:   Left TKA  plans home w/HH services  MD office pre-arranged w/[facility]  Needs DME     Action/Plan:   Home w/HH services  CPM per [employer]   Anticipated DC Date:  12/05/2012   Anticipated DC Plan:  HOME W HOME HEALTH SERVICES      DC Planning Services  CM consult      [facility] Choice  DURABLE MEDICAL EQUIPMENT   Choice offered to / List presented to:  C-1 Patient   DME arranged  3-N-1  CPM  WALKER Lavone Nian      DME agency  [facility]        Status of service:  Completed, signed off Medicare Important Message given?  NO (If response is "NO", the following Medicare IM given date fields will be blank) Date Medicare IM given:   Date Additional Medicare IM given:    Discharge Disposition:  HOME W HOME HEALTH SERVICES  Per UR Regulation:    If discussed at Long Length of Stay Meetings, dates discussed:    Comments:  12/05/12 13:37  Anette Guarneri RN/CM Per Altamese Cabal PA, CPM arranged through [facility] RW/3n1 to be delivered to room prior to discharge    12/04/12 16:23 Anette Guarneri RN/CM Spoke with patient regarding d/c planning. MD office has pre-arranged for [facility] services w/[facility], Patient will need RW/3n1 and CPM

## 2012-12-05 NOTE — Progress Notes (Signed)
Pt discharged to home accompanied by wife. Pts IV was removed. Discharge instructions and rx given and explained and pt stated understanding. Pt left unit in a stable condition via wheelchair. 

## 2012-12-05 NOTE — Progress Notes (Signed)
Physical Therapy Treatment Patient Details Name: Emannuel Vise MRN: [id number] DOB: 05-17-1946 Today's Date: 12/05/2012 Time: 4098-1191 PT Time Calculation (min): 11 min  PT Assessment / Plan / Recommendation Comments on Treatment Session  will see pt once more to review HEP.     Follow Up Recommendations  Home health PT     Does the patient have the potential to tolerate intense rehabilitation     Barriers to Discharge        Equipment Recommendations  Rolling walker with 5" wheels;Other (comment)    Recommendations for Other Services    Frequency 7X/week   Plan Discharge plan remains appropriate;Frequency remains appropriate    Precautions / Restrictions Precautions Precautions: Knee Restrictions Weight Bearing Restrictions: Yes LLE Weight Bearing: Weight bearing as tolerated   Pertinent Vitals/Pain No c/o pain.     Mobility  Bed Mobility Bed Mobility: Not assessed Supine to Sit: 6: Modified independent (Device/Increase time);With rails Sitting - Scoot to Edge of Bed: 6: Modified independent (Device/Increase time);With rail Transfers Transfers: Sit to Stand;Stand to Sit;Stand Pivot Transfers Sit to Stand: 5: Supervision;From chair/3-in-1;With upper extremity assist Stand to Sit: 5: Supervision;To chair/3-in-1 Details for Transfer Assistance: Cues for hand placement.   Ambulation/Gait Ambulation/Gait Assistance: 6: Modified independent (Device/Increase time) Ambulation Distance (Feet): 200 Feet Assistive device: Rolling walker Ambulation/Gait Assistance Details: cues to decrease deprndence on UEs Gait Pattern: Step-through pattern Gait velocity: WFL Stairs: No    Exercises     PT Diagnosis:    PT Problem List:   PT Treatment Interventions:     PT Goals Acute Rehab PT Goals PT Goal Formulation: With patient/family Time For Goal Achievement: 12/11/12 Potential to Achieve Goals: Good Pt will go Sit to Stand: with modified independence PT Goal: Sit to Stand  - Progress: Met Pt will go Stand to Sit: with modified independence PT Goal: Stand to Sit - Progress: Met Pt will Ambulate: >150 feet;with modified independence;with least restrictive assistive device PT Goal: Ambulate - Progress: Progressing toward goal Pt will Perform Home Exercise Program: Independently  Visit Information  Last PT Received On: 12/05/12 Assistance Needed: +1    Subjective Data  Subjective: I plan to home today.   Cognition  Cognition Overall Cognitive Status: Appears within functional limits for tasks assessed/performed Arousal/Alertness: Awake/alert Orientation Level: Appears intact for tasks assessed Behavior During Session: [facility] for tasks performed    Balance  Balance Balance Assessed: No  End of Session PT - End of Session Equipment Utilized During Treatment: Gait belt Activity Tolerance: Patient tolerated treatment well Patient left: in chair;with call bell/phone within reach;with family/visitor present Nurse Communication: Mobility status   GP     Lorrin Nawrot,Jjesus 12/05/2012, 12:57 PM Ustin L. Laron Angelini DPT [phone]

## 2012-12-05 NOTE — Discharge Summary (Signed)
SPORTS MEDICINE & JOINT REPLACEMENT   Georgena Spurling, MD   Altamese Cabal, PA-C [address] [place], [place], [place]  [address]                             [phone]  PATIENT ID: Julian Pineda        MRN:  [id number]          DOB/AGE: 1945-11-22 / 67 y.o.    DISCHARGE SUMMARY  ADMISSION DATE:    12/04/2012 DISCHARGE DATE:   12/05/2012   ADMISSION DIAGNOSIS: osteoarthritis left knee    DISCHARGE DIAGNOSIS:  osteoarthritis left knee    ADDITIONAL DIAGNOSIS: Active Problems:  * No active hospital problems. *   Past Medical History  Diagnosis Date  . Prostate disease     followed by Dr. Achilles Dunk , s/p biopsy complicated by infection  . Rosacea     Dr. Jarold Motto  . 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 Hypertension     sees Dr. Danella Maiers, [place]  . GERD (gastroesophageal reflux disease)     "occas"  . Cancer     "skin Ca lesion removed:"  . Chronic back pain     hx of  . Rheumatoid arteritis     PROCEDURE: Procedure(s): TOTAL KNEE ARTHROPLASTY on 12/04/2012  CONSULTS:     HISTORY:  See H&P in chart  HOSPITAL COURSE:  Jevin Camino is a 67 y.o. admitted on 12/04/2012 and found to have a diagnosis of osteoarthritis left knee.  After appropriate laboratory studies were obtained  they were taken to the operating room on 12/04/2012 and underwent Procedure(s): TOTAL KNEE ARTHROPLASTY.   They were given perioperative antibiotics:  Anti-infectives     Start     Dose/Rate Route Frequency Ordered Stop   12/04/12 1215   doxycycline (VIBRA-TABS) tablet 100 mg        100 mg Oral Daily 12/04/12 1200     12/04/12 1215   ceFAZolin (ANCEF) IVPB 2 g/50 mL premix        2 g 100 mL/hr over 30 Minutes Intravenous Every 6 hours 12/04/12 1200 12/04/12 2055   12/04/12 0600   vancomycin (VANCOCIN) IVPB 1000 mg/200 mL premix        1,000 mg 200 mL/hr over 60 Minutes Intravenous On call to O.R. 12/03/12 1220 12/04/12 0715        .  Tolerated the procedure well.  Placed  with a foley intraoperatively.  Given Ofirmev at induction and for 48 hours.    POD# 1: Vital signs were stable.  Patient denied Chest pain, shortness of breath, or calf pain.  Patient was started on Lovenox 30 mg subcutaneously twice daily at 8am.  Consults to PT, OT, and care management were made.  The patient was weight bearing as tolerated.  CPM was placed on the operative leg 0-90 degrees for 6-8 hours a day.  Incentive spirometry was taught.  Dressing was changed.  Marcaine pump and hemovac were discontinued.     Continued  PT for ambulation and exercise program.  IV saline locked.  O2 discontinued.    The remainder of the hospital course was dedicated to ambulation and strengthening.   The patient was discharged on 1 Day Post-Op in  Good condition.  Blood products given:none  DIAGNOSTIC STUDIES: Recent vital signs: Patient Vitals for the past 24 hrs:  BP Temp Temp src Pulse Resp SpO2  12/05/12 0630 109/58 mmHg  98.5 F (36.9 C) - 64  20  98 %  January 03, 2013 2101 116/70 mmHg 98.8 F (37.1 C) Oral 68  17  100 %  January 03, 2013 1600 - - - - 16  100 %       Recent laboratory studies:  Basename 12/05/12 0425 January 03, 2013 1320 11/29/12 0846  WBC 14.5* 11.5* 5.5  HGB 11.6* 13.1 13.9  HCT 33.3* 38.2* 40.4  PLT 270 261 252    Basename 12/05/12 0425 01-03-2013 1320 11/29/12 0846  NA 133* -- 139  K 4.5 -- 4.1  CL 101 -- 102  CO2 25 -- 26  BUN 19 -- 15  CREATININE 0.93 0.85 0.90  GLUCOSE 125* -- 101*  CALCIUM 8.9 -- 9.2   Lab Results  Component Value Date   INR 0.94 11/29/2012   INR 0.97 11/10/2012     Recent Radiographic Studies :  Dg Chest 2 View  11/29/2012  *RADIOLOGY REPORT*  Clinical Data: Hypertension.  CHEST - 2 VIEW  Comparison: None.  Findings: Cardiomediastinal silhouette appears normal.  No acute pulmonary disease is noted.  Bony thorax is intact.  IMPRESSION: No acute cardiopulmonary abnormality seen.   Original Report Authenticated By: Lupita Raider.,  M.D.     DISCHARGE  INSTRUCTIONS: Discharge Orders    Future Appointments: Provider: Department: Dept Phone: Center:   02/21/2013 8:30 AM Shelia Media, MD [facility] PRIMARY CARE [place] [phone] None     Future Orders Please Complete By Expires   Diet - low sodium heart healthy      Call MD / Call 911      Comments:   If you experience chest pain or shortness of breath, CALL 911 and be transported to the hospital emergency room.  If you develope a fever above 101 F, pus (white drainage) or increased drainage or redness at the wound, or calf pain, call your surgeon's office.   Constipation Prevention      Comments:   Drink plenty of fluids.  Prune juice may be helpful.  You may use a stool softener, such as Colace (over the counter) 100 mg twice a day.  Use MiraLax (over the counter) for constipation as needed.   Increase activity slowly as tolerated      Driving restrictions      Comments:   No driving for 6 weeks   Lifting restrictions      Comments:   No lifting for 6 weeks   CPM      Comments:   Continuous passive motion machine (CPM):      Use the CPM from 0 to 90 for 6-8 hours per day.      You may increase by 10 per day.  You may break it up into 2 or 3 sessions per day.      Use CPM for 2 weeks or until you are told to stop.   TED hose      Comments:   Use stockings (TED hose) for 3 weeks on both leg(s).  You may remove them at night for sleeping.   Change dressing      Comments:   Change dressing on thursday, then change the dressing daily with sterile 4 x 4 inch gauze dressing and apply TED hose.   Do not put a pillow under the knee. Place it under the heel.         DISCHARGE MEDICATIONS:     Medication List     As of 12/05/2012  1:13 PM  STOP taking these medications         diclofenac sodium 1 % Gel   Commonly known as: VOLTAREN      TAKE these medications         celecoxib 200 MG capsule   Commonly known as: CELEBREX   Take 1 capsule (200 mg total) by mouth every  12 (twelve) hours.      doxycycline 100 MG capsule   Commonly known as: VIBRAMYCIN   Take 100 mg by mouth daily.      enoxaparin 40 MG/0.4ML injection   Commonly known as: LOVENOX   Inject 0.4 mLs (40 mg total) into the skin daily.      ergocalciferol 50000 UNITS capsule   Commonly known as: VITAMIN D2   Take 50,000 Units by mouth once a week. On Mondays      fluticasone 50 MCG/ACT nasal spray   Commonly known as: FLONASE   Place 2 sprays into the nose daily as needed. For allergies      folic acid 1 MG tablet   Commonly known as: FOLVITE   Take 1 mg by mouth daily.      losartan 100 MG tablet   Commonly known as: COZAAR   Take 100 mg by mouth daily.      methocarbamol 500 MG tablet   Commonly known as: ROBAXIN   Take 1 tablet (500 mg total) by mouth every 6 (six) hours as needed.      methotrexate 25 MG/ML Soln   Inject 25 mg into the muscle once a week. On Mondays      naproxen sodium 220 MG tablet   Commonly known as: ANAPROX   Take 220 mg by mouth 2 (two) times daily as needed. For pain      oxyCODONE 5 MG immediate release tablet   Commonly known as: Oxy IR/ROXICODONE   Take 1-2 tablets (5-10 mg total) by mouth every 4 (four) hours as needed.      OxyCODONE 20 mg T12a   Commonly known as: OXYCONTIN   Take 1 tablet (20 mg total) by mouth every 12 (twelve) hours.      TUMS 500 MG chewable tablet   Generic drug: calcium carbonate   Chew 2 tablets by mouth 2 (two) times daily as needed. Indigestion        FOLLOW UP VISIT:       Follow-up Information    Follow up with Raymon Mutton, MD. Call on 12/19/2012.   Contact information:   [address] [place] [address] [phone]          DISPOSITION: HOME   CONDITION:  {Good  Gjon Letarte 12/05/2012, 1:13 PM

## 2012-12-05 NOTE — Progress Notes (Signed)
PHYSICAL THERAPY PROGRESS NOTE:    12/05/12 1300  PT Visit Information  Last PT Received On 12/05/12  Assistance Needed +1  PT Time Calculation  PT Start Time 1310  PT Stop Time 1336  PT Time Calculation (min) 26 min  Subjective Data  Subjective I plan to home today.  Patient Stated Goal return to golf  Precautions  Precautions Knee  Restrictions  Weight Bearing Restrictions Yes  LLE Weight Bearing WBAT  Cognition  Overall Cognitive Status Appears within functional limits for tasks assessed/performed  Arousal/Alertness Awake/alert  Orientation Level Appears intact for tasks assessed  Behavior During Session [facility] for tasks performed  Bed Mobility  Bed Mobility Supine to Sit;Sit to Supine  Supine to Sit 7: Independent  Sitting - Scoot to Edge of Bed 7: Independent  Transfers  Transfers Sit to Stand;Stand to Sit  Sit to Stand 6: Modified independent (Device/Increase time)  Stand to Sit 6: Modified independent (Device/Increase time)  Ambulation/Gait  Ambulation/Gait Assistance Not tested (comment)  Exercises  Exercises Total Joint  Total Joint Exercises  Ankle Circles/Pumps 10 reps;Both  Quad Sets Left;10 reps;Supine  Short Arc Quad Left;AAROM;10 reps;Supine  Heel Slides 10 reps;Left;AROM  Hip ABduction/ADduction 10 reps;Supine;AROM  Straight Leg Raises AAROM;Left;5 reps;Supine  Long Arc [place];Left;5 reps;Seated  Goniometric ROM 0-100 degrees AROM L KNEE  PT - End of Session  Equipment Utilized During Treatment Gait belt  Activity Tolerance Patient tolerated treatment well  Patient left in chair;with call bell/phone within reach;with family/visitor present  Nurse Communication Mobility status  PT - Assessment/Plan  Comments on Treatment Session Pt unable to control knee extension secondary to quad weakness.  Provided pt with HEP.    PT Plan Discharge plan remains appropriate;Frequency remains appropriate  PT Frequency 7X/week  Follow Up Recommendations Home health  PT  PT equipment Rolling walker with 5" wheels;Other (comment)  Acute Rehab PT Goals  PT Goal Formulation With patient/family  Time For Goal Achievement 12/11/12  Potential to Achieve Goals Good  Pt will go Sit to Stand with modified independence  PT Goal: Sit to Stand - Progress Met  Pt will go Stand to Sit with modified independence  PT Goal: Stand to Sit - Progress Met  Pt will Ambulate >150 feet;with modified independence;with least restrictive assistive device  PT Goal: Ambulate - Progress Met  Pt will Perform Home Exercise Program Independently  PT Goal: Perform Home Exercise Program - Progress Progressing toward goal  PT General Charges  $$ ACUTE PT VISIT 1 Procedure  PT Treatments  $Therapeutic Exercise 23-37 mins

## 2012-12-05 NOTE — Progress Notes (Signed)
SPORTS MEDICINE AND JOINT REPLACEMENT  Georgena Spurling, MD   Altamese Cabal, PA-C [address] [place], [place], [place]  [address]                             [phone]   PROGRESS NOTE  Subjective:  negative for Chest Pain  negative for Shortness of Breath  negative for Nausea/Vomiting   negative for Calf Pain  negative for Bowel Movement   Tolerating Diet: yes         Patient reports pain as 0 on 0-10 scale.    Objective: Vital signs in last 24 hours:   Patient Vitals for the past 24 hrs:  BP Temp Temp src Pulse Resp SpO2  12/05/12 0630 109/58 mmHg 98.5 F (36.9 C) - 64  20  98 %  12/04/12 2101 116/70 mmHg 98.8 F (37.1 C) Oral 68  17  100 %  12/04/12 1600 - - - - 16  100 %    @flow {1959:LAST@   Intake/Output from previous day:   02/03 0701 - 02/04 0700 In: 1340 [I.V.:1340] Out: 1850 [Urine:1175; Drains:575]   Intake/Output this shift:       Intake/Output      02/03 0701 - 02/04 0700 02/04 0701 - 02/05 0700   I.V. 1340    Total Intake 1340    Urine 1175    Drains 575    Blood 100    Total Output 1850    Net -510            LABORATORY DATA:  Basename 12/05/12 0425 12/04/12 1320 11/29/12 0846  WBC 14.5* 11.5* 5.5  HGB 11.6* 13.1 13.9  HCT 33.3* 38.2* 40.4  PLT 270 261 252    Basename 12/05/12 0425 12/04/12 1320 11/29/12 0846  NA 133* -- 139  K 4.5 -- 4.1  CL 101 -- 102  CO2 25 -- 26  BUN 19 -- 15  CREATININE 0.93 0.85 0.90  GLUCOSE 125* -- 101*  CALCIUM 8.9 -- 9.2   Lab Results  Component Value Date   INR 0.94 11/29/2012   INR 0.97 11/10/2012    Examination:  General appearance: alert, cooperative and no distress Extremities: Homans sign is negative, no sign of DVT  Wound Exam: clean, dry, intact   Drainage:  None: wound tissue dry  Motor Exam: EHL and FHL Intact  Sensory Exam: Deep Peroneal normal   Assessment:    1 Day Post-Op  Procedure(s) (LRB): TOTAL KNEE ARTHROPLASTY (Left)  ADDITIONAL DIAGNOSIS:  Active Problems:  *  No active hospital problems. *   Acute Blood Loss Anemia   Plan: Physical Therapy as ordered Weight Bearing as Tolerated (WBAT)  DVT Prophylaxis:  Lovenox  DISCHARGE PLAN: Home  DISCHARGE NEEDS: HHPT, CPM, Walker and 3-in-1 comode seat         Julian Pineda 12/05/2012, 1:02 PM

## 2012-12-05 NOTE — Evaluation (Signed)
Occupational Therapy Evaluation Patient Details Name: Julian Pineda MRN: [id number] DOB: 07-12-1946 Today's Date: 12/05/2012 Time: 4098-1191 OT Time Calculation (min): 26 min  OT Assessment / Plan / Recommendation Clinical Impression  Pt demos decline in function with ADLs, balance, safety and activity tolerance following L knee surgery. Pt would benefit from OT services to address these impairments to restore PLOF to return home safely    OT Assessment  Patient needs continued OT Services    Follow Up Recommendations  Home health OT    Barriers to Discharge None    Equipment Recommendations  3 in 1 bedside comode;Tub/shower bench    Recommendations for Other Services    Frequency  Min 2X/week    Precautions / Restrictions Precautions Precautions: Knee Restrictions Weight Bearing Restrictions: Yes LLE Weight Bearing: Weight bearing as tolerated   Pertinent Vitals/Pain     ADL  Grooming: Performed;Min guard Where Assessed - Grooming: Supported standing Upper Body Bathing: Simulated;Supervision/safety;Set up Where Assessed - Upper Body Bathing: Unsupported sitting Lower Body Bathing: Simulated;Minimal assistance Where Assessed - Lower Body Bathing: Unsupported sitting;Supported standing;Supported sit to stand Upper Body Dressing: Performed;Supervision/safety;Set up Where Assessed - Upper Body Dressing: Unsupported sitting Lower Body Dressing: Performed;Minimal assistance Where Assessed - Lower Body Dressing: Unsupported sitting;Supported standing;Supported sit to stand Toilet Transfer: Performed;Min guard Toilet Transfer Method: Other (comment) (ambulating from RW level) Acupuncturist: Raised toilet seat with arms (or 3-in-1 over toilet) Toileting - Clothing Manipulation and Hygiene: Performed;Min guard Where Assessed - Glass blower/designer Manipulation and Hygiene: Standing Equipment Used: Rolling walker;Sock aid;Gait belt;Long-handled shoe horn;Long-handled  sponge;Reacher Transfers/Ambulation Related to ADLs: pt required min verbal cues to slow down and to push walker forward instead of picking it up ADL Comments: Pt provided with education and demo of ADL A/E for home use    OT Diagnosis: Generalized weakness  OT Problem List: Decreased activity tolerance;Decreased knowledge of use of DME or AE;Impaired balance (sitting and/or standing) OT Treatment Interventions: Self-care/ADL training;Therapeutic activities;Therapeutic exercise;Neuromuscular education;DME and/or AE instruction;Patient/family education;Balance training   OT Goals Acute Rehab OT Goals OT Goal Formulation: With patient Time For Goal Achievement: 12/12/12 Potential to Achieve Goals: Good ADL Goals Pt Will Perform Grooming: with set-up;with supervision;Standing at sink;Supported ADL Goal: Grooming - Progress: Goal set today Pt Will Perform Lower Body Bathing: with set-up;with supervision;with adaptive equipment ADL Goal: Lower Body Bathing - Progress: Goal set today Pt Will Perform Lower Body Dressing: with supervision;with set-up;Supported;with adaptive equipment ADL Goal: Lower Body Dressing - Progress: Goal set today Pt Will Transfer to Toilet: with supervision;with DME;Grab bars ADL Goal: Toilet Transfer - Progress: Goal set today Pt Will Perform Toileting - Clothing Manipulation: with supervision;Standing ADL Goal: Toileting - Clothing Manipulation - Progress: Goal set today Pt Will Perform Tub/Shower Transfer: Tub transfer;with DME;Grab bars ADL Goal: Tub/Shower Transfer - Progress: Goal set today  Visit Information  Last OT Received On: 12/05/12    Subjective Data  Subjective: " I hope to go home tomorrow " Patient Stated Goal: to return home   Prior Functioning     Home Living Lives With: Spouse Available Help at Discharge: Family;Available 24 hours/day Type of Home: House Home Access: Level entry Home Layout: One level Bathroom Shower/Tub: Teacher, music: Standard Home Adaptive Equipment: Crutches Prior Function Level of Independence: Independent Able to Take Stairs?: Yes Driving: Yes Vocation: Part time employment Communication Communication: No difficulties Dominant Hand: Left         Vision/Perception Vision - History Baseline Vision: Wears glasses  only for reading Patient Visual Report: No change from baseline Vision - Assessment Eye Alignment: Within Functional Limits   Cognition  Cognition Overall Cognitive Status: Appears within functional limits for tasks assessed/performed Arousal/Alertness: Awake/alert Orientation Level: Appears intact for tasks assessed Behavior During Session: [facility] for tasks performed    Extremity/Trunk Assessment Right Upper Extremity Assessment RUE ROM/Strength/Tone: [facility] for tasks assessed Left Upper Extremity Assessment LUE ROM/Strength/Tone: [facility] for tasks assessed     Mobility Bed Mobility Bed Mobility: Supine to Sit;Sitting - Scoot to Edge of Bed Supine to Sit: 6: Modified independent (Device/Increase time);With rails Sitting - Scoot to Edge of Bed: 6: Modified independent (Device/Increase time);With rail Transfers Transfers: Sit to Stand;Stand to Sit Sit to Stand: From bed;From chair/3-in-1;4: Min guard Stand to Sit: 4: Min guard;With armrests;To bed;To chair/3-in-1     Exercise     Balance Balance Balance Assessed: No   End of Session OT - End of Session Equipment Utilized During Treatment: Gait belt;Other (comment) (RW, 3 in 1, ADL A/E) Activity Tolerance: Patient tolerated treatment well Patient left: in chair;with call bell/phone within reach CPM Left Knee CPM Left Knee: Off  GO     Margaretmary Eddy [facility] 12/05/2012, 11:39 AM

## 2012-12-05 NOTE — Plan of Care (Signed)
Problem: Phase II Progression Outcomes Goal: Discharge plan established Recommend HH OT for ADL and ADL mobility trg, tub transfer bench after acute care d/c

## 2012-12-05 NOTE — Op Note (Signed)
TOTAL KNEE REPLACEMENT OPERATIVE NOTE:  12/04/2012  5:28 PM  PATIENT:  Julian Pineda  67 y.o. male  PRE-OPERATIVE DIAGNOSIS:  osteoarthritis left knee  POST-OPERATIVE DIAGNOSIS:  osteoarthritis left knee  PROCEDURE:  Procedure(s): TOTAL KNEE ARTHROPLASTY  SURGEON:  Surgeon(s): Dannielle Huh, MD  PHYSICIAN ASSISTANT: Altamese Cabal, [facility]  ANESTHESIA:   general  DRAINS: Hemovac and On-Q Marcaine Pain Pump  SPECIMEN: None  COUNTS:  Correct  TOURNIQUET:   Total Tourniquet Time Documented: Thigh (Left) - 67 minutes  DICTATION:  Indication for procedure:    The patient is a 67 y.o. male who has failed conservative treatment for osteoarthritis left knee.  Informed consent was obtained prior to anesthesia. The risks versus benefits of the operation were explain and in a way the patient can, and did, understand.   Description of procedure:     The patient was taken to the operating room and placed under anesthesia.  The patient was positioned in the usual fashion taking care that all body parts were adequately padded and/or protected.  I foley catheter was not placed.  A tourniquet was applied and the leg prepped and draped in the usual sterile fashion.  The extremity was exsanguinated with the esmarch and tourniquet inflated to 350 mmHg.  Pre-operative range of motion was normal.  The knee was in 3 degree of mild varus.  A midline incision approximately 6-7 inches long was made with a #10 blade.  A new blade was used to make a parapatellar arthrotomy going 2-3 cm into the quadriceps tendon, over the patella, and alongside the medial aspect of the patellar tendon.  A synovectomy was then performed with the #10 blade and forceps. I then elevated the deep MCL off the medial tibial metaphysis subperiosteally around to the semimembranosus attachment.    I everted the patella and used calipers to measure patellar thickness.  I used the reamer to ream down to appropriate thickness to recreate  the native thickness.  I then removed excess bone with the rongeur and sagittal saw.  I used the appropriately sized template and drilled the three lug holes.  I then put the trial in place and measured the thickness with the calipers to ensure recreation of the native thickness.  The trial was then removed and the patella subluxed and the knee brought into flexion.  A homan retractor was place to retract and protect the patella and lateral structures.  A Z-retractor was place medially to protect the medial structures.  The extra-medullary alignment system was used to make cut the tibial articular surface perpendicular to the anamotic axis of the tibia and in 3 degrees of posterior slope.  The cut surface and alignment jig was removed.  I then used the intramedullary alignment guide to make a 6 valgus cut on the distal femur.  I then marked out the epicondylar axis on the distal femur.  The posterior condylar axis measured 3 degrees.  I then used the anterior referencing sizer and measured the femur to be a size F.  The 4-In-1 cutting block was screwed into place in external rotation matching the posterior condylar angle, making our cuts perpendicular to the epicondylar axis.  Anterior, posterior and chamfer cuts were made with the sagittal saw.  The cutting block and cut pieces were removed.  A lamina spreader was placed in 90 degrees of flexion.  The ACL, PCL, menisci, and posterior condylar osteophytes were removed.  A 10 mm spacer blocked was found to offer good flexion and  extension gap balance after minimal in degree releasing.   The scoop retractor was then placed and the femoral finishing block was pinned in place.  The small sagittal saw was used as well as the lug drill to finish the femur.  The block and cut surfaces were removed and the medullary canal hole filled with autograft bone from the cut pieces.  The tibia was delivered forward in deep flexion and external rotation.  A size 5 tray was  selected and pinned into place centered on the medial 1/3 of the tibial tubercle.  The reamer and keel was used to prepare the tibia through the tray.    I then trialed with the size F femur, size 5 tibia, a 10 mm insert and the 38 patella.  I had excellent flexion/extension gap balance, excellent patella tracking.  Flexion was full and beyond 120 degrees; extension was zero.  These components were chosen and the staff opened them to me on the back table while the knee was lavaged copiously and the cement mixed.  I cemented in the components and removed all excess cement.  The polyethylene tibial component was snapped into place and the knee placed in extension while cement was hardening.  The capsule was infilltrated with 20cc of .25% Marcaine with epinephrine.  A hemovac was place in the joint exiting superolaterally.  A pain pump was place superomedially superficial to the arthrotomy.  Once the cement was hard, the tourniquet was let down.  Hemostasis was obtained.  The arthrotomy was closed with figure-8 #1 vicryl sutures.  The deep soft tissues were closed with #0 vicryls and the subcuticular layer closed with a running #2-0 vicryl.  The skin was reapproximated and closed with skin staples.  The wound was dressed with xeroform, 4 x4's, 2 ABD sponges, a single layer of webril and a TED stocking.   The patient was then awakened, extubated, and taken to the recovery room in stable condition.  BLOOD LOSS:  300cc DRAINS: 1 hemovac, 1 pain catheter COMPLICATIONS:  None.  PLAN OF CARE: Admit to inpatient   PATIENT DISPOSITION:  PACU - hemodynamically stable.   Delay start of Pharmacological VTE agent (>24hrs) due to surgical blood loss or risk of bleeding:  not applicable  Please fax a copy of this op note to my office at [phone] (please only include page 1 and 2 of the Case Information op note)

## 2012-12-05 NOTE — Progress Notes (Signed)
Physical Therapy Treatment Patient Details Name: Trevyon Swor MRN: [id number] DOB: April 28, 1946 Today's Date: 12/05/2012 Time: 7846-9629 PT Time Calculation (min): 26 min  PT Assessment / Plan / Recommendation Comments on Treatment Session  Pt unable to control knee extension secondary to quad weakness.  Provided pt with HEP.      Follow Up Recommendations  Home health PT     Does the patient have the potential to tolerate intense rehabilitation     Barriers to Discharge        Equipment Recommendations  Rolling walker with 5" wheels;Other (comment)    Recommendations for Other Services    Frequency 7X/week   Plan Discharge plan remains appropriate;Frequency remains appropriate    Precautions / Restrictions Precautions Precautions: Knee Restrictions Weight Bearing Restrictions: Yes LLE Weight Bearing: Weight bearing as tolerated   Pertinent Vitals/Pain 3-4/10 pain in knee.  No intervention required per pt.      Mobility  Bed Mobility Bed Mobility: Supine to Sit;Sit to Supine Supine to Sit: 7: Independent Sitting - Scoot to Edge of Bed: 7: Independent Transfers Transfers: Sit to Stand;Stand to Sit Sit to Stand: 6: Modified independent (Device/Increase time) Stand to Sit: 6: Modified independent (Device/Increase time) Ambulation/Gait Ambulation/Gait Assistance: Not tested (comment)    Exercises Total Joint Exercises Ankle Circles/Pumps: 10 reps;Both Quad Sets: Left;10 reps;Supine Short Arc Quad: Left;AAROM;10 reps;Supine Heel Slides: 10 reps;Left;AROM Hip ABduction/ADduction: 10 reps;Supine;AROM Straight Leg Raises: AAROM;Left;5 reps;Supine Long Arc Quad: AAROM;Left;5 reps;Seated Goniometric ROM: 0-100 degrees AROM L KNEE   PT Diagnosis:    PT Problem List:   PT Treatment Interventions:     PT Goals Acute Rehab PT Goals PT Goal Formulation: With patient/family Time For Goal Achievement: 12/11/12 Potential to Achieve Goals: Good Pt will go Sit to Stand: with  modified independence PT Goal: Sit to Stand - Progress: Met Pt will go Stand to Sit: with modified independence PT Goal: Stand to Sit - Progress: Met Pt will Ambulate: >150 feet;with modified independence;with least restrictive assistive device PT Goal: Ambulate - Progress: Met Pt will Perform Home Exercise Program: Independently PT Goal: Perform Home Exercise Program - Progress: Progressing toward goal  Visit Information  Last PT Received On: 12/05/12 Assistance Needed: +1    Subjective Data  Subjective: I plan to home today. Patient Stated Goal: return to golf   Cognition  Cognition Overall Cognitive Status: Appears within functional limits for tasks assessed/performed Arousal/Alertness: Awake/alert Orientation Level: Appears intact for tasks assessed Behavior During Session: [facility] for tasks performed    Balance     End of Session PT - End of Session Equipment Utilized During Treatment: Gait belt Activity Tolerance: Patient tolerated treatment well Patient left: in chair;with call bell/phone within reach;with family/visitor present Nurse Communication: Mobility status   GP     Kimberli Winne,[place] 12/05/2012, 4:57 PM Curby L. Jarquavious Fentress DPT [phone]

## 2012-12-06 ENCOUNTER — Encounter (HOSPITAL_COMMUNITY): Payer: Self-pay | Admitting: Orthopedic Surgery

## 2012-12-22 ENCOUNTER — Encounter: Payer: Self-pay | Admitting: Orthopedic Surgery

## 2012-12-30 ENCOUNTER — Encounter: Payer: Self-pay | Admitting: Orthopedic Surgery

## 2013-01-16 DIAGNOSIS — Z96659 Presence of unspecified artificial knee joint: Secondary | ICD-10-CM | POA: Insufficient documentation

## 2013-01-29 ENCOUNTER — Telehealth: Payer: Self-pay | Admitting: Internal Medicine

## 2013-01-29 NOTE — Telephone Encounter (Signed)
Recent labs performed 01/15/2013 were normal.

## 2013-01-29 NOTE — Telephone Encounter (Signed)
Informed patient of this and he already has a copy of them, but thanks for calling.

## 2013-01-30 ENCOUNTER — Encounter: Payer: Self-pay | Admitting: Orthopedic Surgery

## 2013-02-21 ENCOUNTER — Encounter: Payer: PRIVATE HEALTH INSURANCE | Admitting: Internal Medicine

## 2013-02-22 ENCOUNTER — Other Ambulatory Visit: Payer: Self-pay | Admitting: Internal Medicine

## 2013-11-20 DIAGNOSIS — Z85828 Personal history of other malignant neoplasm of skin: Secondary | ICD-10-CM | POA: Insufficient documentation

## 2013-12-03 ENCOUNTER — Other Ambulatory Visit: Payer: Self-pay | Admitting: Internal Medicine

## 2013-12-11 ENCOUNTER — Ambulatory Visit (INDEPENDENT_AMBULATORY_CARE_PROVIDER_SITE_OTHER): Payer: BC Managed Care – PPO | Admitting: Internal Medicine

## 2013-12-11 ENCOUNTER — Encounter: Payer: Self-pay | Admitting: *Deleted

## 2013-12-11 ENCOUNTER — Encounter (INDEPENDENT_AMBULATORY_CARE_PROVIDER_SITE_OTHER): Payer: Self-pay

## 2013-12-11 ENCOUNTER — Encounter: Payer: Self-pay | Admitting: Internal Medicine

## 2013-12-11 VITALS — BP 126/70 | HR 75 | Temp 98.3°F | Wt 206.0 lb

## 2013-12-11 DIAGNOSIS — I10 Essential (primary) hypertension: Secondary | ICD-10-CM

## 2013-12-11 DIAGNOSIS — M069 Rheumatoid arthritis, unspecified: Secondary | ICD-10-CM

## 2013-12-11 DIAGNOSIS — N529 Male erectile dysfunction, unspecified: Secondary | ICD-10-CM

## 2013-12-11 LAB — COMPREHENSIVE METABOLIC PANEL
ALBUMIN: 3.9 g/dL (ref 3.5–5.2)
ALT: 24 U/L (ref 0–53)
AST: 26 U/L (ref 0–37)
Alkaline Phosphatase: 53 U/L (ref 39–117)
BUN: 16 mg/dL (ref 6–23)
CALCIUM: 9.2 mg/dL (ref 8.4–10.5)
CHLORIDE: 105 meq/L (ref 96–112)
CO2: 26 mEq/L (ref 19–32)
CREATININE: 1 mg/dL (ref 0.4–1.5)
GFR: 82.33 mL/min (ref >60.00)
Glucose, Bld: 97 mg/dL (ref 70–99)
POTASSIUM: 4.7 meq/L (ref 3.5–5.1)
SODIUM: 139 meq/L (ref 135–145)
Total Bilirubin: 0.9 mg/dL (ref 0.3–1.2)
Total Protein: 6.9 g/dL (ref 6.0–8.3)

## 2013-12-11 LAB — CBC WITH DIFFERENTIAL/PLATELET
BASOS ABS: 0 10*3/uL (ref 0.0–0.1)
Basophils Relative: 0.1 % (ref 0.0–3.0)
EOS ABS: 0.1 10*3/uL (ref 0.0–0.7)
Eosinophils Relative: 2.4 % (ref 0.0–5.0)
HCT: 48.1 % (ref 39.0–52.0)
HEMOGLOBIN: 15.5 g/dL (ref 13.0–17.0)
LYMPHS PCT: 13.7 % (ref 12.0–46.0)
Lymphs Abs: 0.8 10*3/uL (ref 0.7–4.0)
MCHC: 32.2 g/dL (ref 30.0–36.0)
MCV: 98.1 fl (ref 78.0–100.0)
Monocytes Absolute: 0.4 10*3/uL (ref 0.1–1.0)
Monocytes Relative: 6.6 % (ref 3.0–12.0)
NEUTROS ABS: 4.4 10*3/uL (ref 1.4–7.7)
Neutrophils Relative %: 77.2 % — ABNORMAL HIGH (ref 43.0–77.0)
Platelets: 273 10*3/uL (ref 150.0–400.0)
RBC: 4.9 Mil/uL (ref 4.22–5.81)
RDW: 14.5 % (ref 11.5–14.6)
WBC: 5.7 10*3/uL (ref 4.5–10.5)

## 2013-12-11 LAB — MICROALBUMIN / CREATININE URINE RATIO
CREATININE, U: 278.4 mg/dL
MICROALB/CREAT RATIO: 0.1 mg/g (ref 0.0–30.0)
Microalb, Ur: 0.4 mg/dL (ref 0.0–1.9)

## 2013-12-11 MED ORDER — FOLIC ACID 1 MG PO TABS: 1.0000 mg | ORAL_TABLET | Freq: Every day | ORAL | Status: DC

## 2013-12-11 MED ORDER — SILDENAFIL CITRATE 50 MG PO TABS: 50.0000 mg | ORAL_TABLET | Freq: Every day | ORAL | Status: DC | PRN

## 2013-12-11 MED ORDER — LOSARTAN POTASSIUM 100 MG PO TABS: 100.0000 mg | ORAL_TABLET | Freq: Every day | ORAL | Status: DC

## 2013-12-11 NOTE — Progress Notes (Signed)
Pre-visit discussion using our clinic review tool. No additional management support is needed unless otherwise documented below in the visit note.  

## 2013-12-11 NOTE — Assessment & Plan Note (Signed)
Discussed ED and treatment. Will start Viagra 50mg  daily as needed. Discussed potential side effects from this medication. Follow up prn.

## 2013-12-11 NOTE — Assessment & Plan Note (Addendum)
BP Readings from Last 3 Encounters:  12/11/13 126/70  12/05/12 109/58  12/05/12 109/58   BP well controlled on current medication. Will check renal function with labs today.

## 2013-12-11 NOTE — Assessment & Plan Note (Signed)
Symptoms well controlled on methotrexate. Will check CMP and CBC with labs today. Will request notes from his rheumatologist.

## 2013-12-11 NOTE — Progress Notes (Signed)
Subjective:    Patient ID: Julian Pineda, male    DOB: 11/07/1945, 68 y.o.   MRN: [id number]  HPI 68YO male with h/o HTN and RA presents for follow up. Generally feeling well. No recent joint pain. Recovered well after knee replacement last year. Active, golfing and exercising regularly by lifting weights. Compliant with medications.  Concerned about erectile dysfunction including difficulty maintaining an erection. Would like to try Viagra. Has never taken this medication in the past.  Review of Systems  Constitutional: Negative for fever, chills, activity change, appetite change, fatigue and unexpected weight change.  Eyes: Negative for visual disturbance.  Respiratory: Negative for cough and shortness of breath.   Cardiovascular: Negative for chest pain, palpitations and leg swelling.  Gastrointestinal: Negative for abdominal pain and abdominal distention.  Genitourinary: Negative for dysuria, urgency, frequency, decreased urine volume, penile swelling, scrotal swelling, difficulty urinating and testicular pain.  Musculoskeletal: Negative for arthralgias, gait problem and myalgias.  Skin: Negative for color change and rash.  Hematological: Negative for adenopathy.  Psychiatric/Behavioral: Negative for sleep disturbance and dysphoric mood. The patient is not nervous/anxious.        Objective:    BP 126/70  Pulse 75  Temp(Src) 98.3 F (36.8 C) (Oral)  Wt 206 lb (93.441 kg)  SpO2 97% Physical Exam  Constitutional: He is oriented to person, place, and time. He appears well-developed and well-nourished. No distress.  HENT:  Head: Normocephalic and atraumatic.  Right Ear: External ear normal.  Left Ear: External ear normal.  Nose: Nose normal.  Mouth/Throat: Oropharynx is clear and moist. No oropharyngeal exudate.  Eyes: Conjunctivae and EOM are normal. Pupils are equal, round, and reactive to light. Right eye exhibits no discharge. Left eye exhibits no discharge. No scleral  icterus.  Neck: Normal range of motion. Neck supple. No tracheal deviation present. No thyromegaly present.  Cardiovascular: Normal rate, regular rhythm and normal heart sounds.  Exam reveals no gallop and no friction rub.   No murmur heard. Pulmonary/Chest: Effort normal and breath sounds normal. No accessory muscle usage. Not tachypneic. No respiratory distress. He has no decreased breath sounds. He has no wheezes. He has no rhonchi. He has no rales. He exhibits no tenderness.  Abdominal: Soft. Normal appearance and bowel sounds are normal. He exhibits no distension. There is no hepatosplenomegaly. There is no tenderness.  Musculoskeletal: Normal range of motion. He exhibits no edema.  Lymphadenopathy:    He has no cervical adenopathy.  Neurological: He is alert and oriented to person, place, and time. No cranial nerve deficit. Coordination normal.  Skin: Skin is warm and dry. No rash noted. He is not diaphoretic. No erythema. No pallor.  Psychiatric: He has a normal mood and affect. His behavior is normal. Judgment and thought content normal.          Assessment & Plan:   Problem List Items Addressed This Visit   Erectile dysfunction     Discussed ED and treatment. Will start Viagra 50mg  daily as needed. Discussed potential side effects from this medication. Follow up prn.    Hypertension - Primary      BP Readings from Last 3 Encounters:  12/11/13 126/70  12/05/12 109/58  12/05/12 109/58   BP well controlled on current medication. Will check renal function with labs today.    Relevant Medications      losartan (COZAAR) tablet      sildenafil (VIAGRA) tablet   Other Relevant Orders      Microalbumin /  creatinine urine ratio      Comprehensive metabolic panel   Rheumatoid arthritis     Symptoms well controlled on methotrexate. Will check CMP and CBC with labs today. Will request notes from his rheumatologist.    Relevant Orders      CBC w/Diff       Return in about 6  months (around 06/10/2014) for Physical.

## 2013-12-12 ENCOUNTER — Telehealth: Payer: Self-pay | Admitting: Internal Medicine

## 2013-12-12 NOTE — Telephone Encounter (Signed)
Relevant patient education assigned to patient using Emmi. ° °

## 2014-01-22 ENCOUNTER — Other Ambulatory Visit: Payer: Self-pay | Admitting: Internal Medicine

## 2014-06-22 ENCOUNTER — Other Ambulatory Visit: Payer: Self-pay | Admitting: Internal Medicine

## 2014-10-24 ENCOUNTER — Encounter: Payer: Self-pay | Admitting: Internal Medicine

## 2014-12-16 ENCOUNTER — Other Ambulatory Visit: Payer: Self-pay | Admitting: Internal Medicine

## 2015-01-17 DIAGNOSIS — L570 Actinic keratosis: Secondary | ICD-10-CM | POA: Diagnosis not present

## 2015-01-17 DIAGNOSIS — D485 Neoplasm of uncertain behavior of skin: Secondary | ICD-10-CM | POA: Diagnosis not present

## 2015-01-17 DIAGNOSIS — L814 Other melanin hyperpigmentation: Secondary | ICD-10-CM | POA: Diagnosis not present

## 2015-01-17 DIAGNOSIS — L821 Other seborrheic keratosis: Secondary | ICD-10-CM | POA: Diagnosis not present

## 2015-01-17 DIAGNOSIS — Z85828 Personal history of other malignant neoplasm of skin: Secondary | ICD-10-CM | POA: Diagnosis not present

## 2015-01-17 DIAGNOSIS — X32XXXA Exposure to sunlight, initial encounter: Secondary | ICD-10-CM | POA: Diagnosis not present

## 2015-01-31 DIAGNOSIS — M0589 Other rheumatoid arthritis with rheumatoid factor of multiple sites: Secondary | ICD-10-CM | POA: Diagnosis not present

## 2015-06-24 DIAGNOSIS — M0589 Other rheumatoid arthritis with rheumatoid factor of multiple sites: Secondary | ICD-10-CM | POA: Diagnosis not present

## 2015-06-24 DIAGNOSIS — M79642 Pain in left hand: Secondary | ICD-10-CM | POA: Diagnosis not present

## 2015-06-24 DIAGNOSIS — M79641 Pain in right hand: Secondary | ICD-10-CM | POA: Diagnosis not present

## 2015-08-13 DIAGNOSIS — D485 Neoplasm of uncertain behavior of skin: Secondary | ICD-10-CM | POA: Diagnosis not present

## 2015-08-13 DIAGNOSIS — X32XXXA Exposure to sunlight, initial encounter: Secondary | ICD-10-CM | POA: Diagnosis not present

## 2015-08-13 DIAGNOSIS — L570 Actinic keratosis: Secondary | ICD-10-CM | POA: Diagnosis not present

## 2015-08-13 DIAGNOSIS — C44319 Basal cell carcinoma of skin of other parts of face: Secondary | ICD-10-CM | POA: Diagnosis not present

## 2015-08-13 DIAGNOSIS — B354 Tinea corporis: Secondary | ICD-10-CM | POA: Diagnosis not present

## 2015-08-18 DIAGNOSIS — L905 Scar conditions and fibrosis of skin: Secondary | ICD-10-CM | POA: Diagnosis not present

## 2015-08-18 DIAGNOSIS — C44319 Basal cell carcinoma of skin of other parts of face: Secondary | ICD-10-CM | POA: Diagnosis not present

## 2015-09-15 ENCOUNTER — Other Ambulatory Visit: Payer: Self-pay | Admitting: Internal Medicine

## 2015-09-15 NOTE — Telephone Encounter (Signed)
Please advise refill, patient hasn't been seen since 2015?

## 2015-09-15 NOTE — Telephone Encounter (Signed)
May refill x 1 then must have follow up visit.

## 2015-10-13 ENCOUNTER — Ambulatory Visit: Payer: Self-pay | Admitting: Internal Medicine

## 2015-10-15 ENCOUNTER — Other Ambulatory Visit: Payer: Self-pay | Admitting: Internal Medicine

## 2015-10-15 NOTE — Telephone Encounter (Signed)
Please advise as last visit with you was in February of 2015.  Had a visit but cancelled on the 12th this month.

## 2015-10-17 ENCOUNTER — Ambulatory Visit (INDEPENDENT_AMBULATORY_CARE_PROVIDER_SITE_OTHER): Payer: Medicare Other | Admitting: Internal Medicine

## 2015-10-17 ENCOUNTER — Encounter: Payer: Self-pay | Admitting: Internal Medicine

## 2015-10-17 VITALS — BP 140/82 | HR 66 | Temp 98.3°F | Ht 70.0 in | Wt 203.4 lb

## 2015-10-17 DIAGNOSIS — I10 Essential (primary) hypertension: Secondary | ICD-10-CM | POA: Diagnosis not present

## 2015-10-17 DIAGNOSIS — E559 Vitamin D deficiency, unspecified: Secondary | ICD-10-CM | POA: Diagnosis not present

## 2015-10-17 DIAGNOSIS — M069 Rheumatoid arthritis, unspecified: Secondary | ICD-10-CM | POA: Diagnosis not present

## 2015-10-17 LAB — CBC WITH DIFFERENTIAL/PLATELET
BASOS PCT: 1 % (ref 0.0–3.0)
Basophils Absolute: 0.1 10*3/uL (ref 0.0–0.1)
EOS PCT: 4.1 % (ref 0.0–5.0)
Eosinophils Absolute: 0.3 10*3/uL (ref 0.0–0.7)
HEMATOCRIT: 46.5 % (ref 39.0–52.0)
HEMOGLOBIN: 15.5 g/dL (ref 13.0–17.0)
LYMPHS PCT: 17.2 % (ref 12.0–46.0)
Lymphs Abs: 1.2 10*3/uL (ref 0.7–4.0)
MCHC: 33.2 g/dL (ref 30.0–36.0)
MCV: 96.8 fl (ref 78.0–100.0)
MONOS PCT: 5.5 % (ref 3.0–12.0)
Monocytes Absolute: 0.4 10*3/uL (ref 0.1–1.0)
Neutro Abs: 4.9 10*3/uL (ref 1.4–7.7)
Neutrophils Relative %: 72.2 % (ref 43.0–77.0)
Platelets: 248 10*3/uL (ref 150.0–400.0)
RBC: 4.8 Mil/uL (ref 4.22–5.81)
RDW: 13.7 % (ref 11.5–15.5)
WBC: 6.8 10*3/uL (ref 4.0–10.5)

## 2015-10-17 LAB — COMPREHENSIVE METABOLIC PANEL
ALBUMIN: 3.9 g/dL (ref 3.5–5.2)
ALT: 16 U/L (ref 0–53)
AST: 18 U/L (ref 0–37)
Alkaline Phosphatase: 48 U/L (ref 39–117)
BILIRUBIN TOTAL: 0.5 mg/dL (ref 0.2–1.2)
BUN: 19 mg/dL (ref 6–23)
CALCIUM: 9.5 mg/dL (ref 8.4–10.5)
CO2: 27 meq/L (ref 19–32)
CREATININE: 1.27 mg/dL (ref 0.40–1.50)
Chloride: 102 mEq/L (ref 96–112)
GFR: 59.99 mL/min — ABNORMAL LOW (ref >60.00)
Glucose, Bld: 86 mg/dL (ref 70–99)
Potassium: 4.5 mEq/L (ref 3.5–5.1)
Sodium: 139 mEq/L (ref 135–145)
Total Protein: 6.2 g/dL (ref 6.0–8.3)

## 2015-10-17 LAB — LIPID PANEL
CHOL/HDL RATIO: 3
Cholesterol: 150 mg/dL (ref 0–200)
HDL: 59.2 mg/dL (ref >39.00)
LDL CALC: 58 mg/dL (ref 0–99)
NONHDL: 90.53
TRIGLYCERIDES: 165 mg/dL — AB (ref 0.0–149.0)
VLDL: 33 mg/dL (ref 0.0–40.0)

## 2015-10-17 LAB — VITAMIN D 25 HYDROXY (VIT D DEFICIENCY, FRACTURES): VITD: 19.75 ng/mL — AB (ref 30.00–100.00)

## 2015-10-17 MED ORDER — MELOXICAM 15 MG PO TABS: 15.0000 mg | ORAL_TABLET | Freq: Every day | ORAL | Status: DC

## 2015-10-17 MED ORDER — LOSARTAN POTASSIUM 100 MG PO TABS: 100.0000 mg | ORAL_TABLET | Freq: Every day | ORAL | Status: DC

## 2015-10-17 NOTE — Progress Notes (Signed)
Subjective:    Patient ID: Julian Pineda, male    DOB: 04-17-1946, 69 y.o.   MRN: [id number]  HPI  69YO male presents for follow up. Last seen in 12/2013.   HTN - BP has been well controlled at home. Generally 062-694W systolic. Compliant with meds. No CP, HA, palpitations.  RA - Continues to follow with rheumatology. Compliant with medication. Symptoms of arthralgia well controlled with Methotrexate and Meloxicam.  Wt Readings from Last 3 Encounters:  10/17/15 203 lb 6 oz (92.25 kg)  12/11/13 206 lb (93.441 kg)  11/29/12 206 lb 14.4 oz (93.849 kg)   BP Readings from Last 3 Encounters:  10/17/15 140/82  12/11/13 126/70  12/05/12 109/58    Past Medical History  Diagnosis Date  . Prostate disease     followed by Dr. Jacqlyn Larsen , s/p biopsy complicated by infection  . Rosacea     Dr. Sharlett Iles  . 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 Hypertension     sees Dr. Jackalyn Lombard, [place]  . GERD (gastroesophageal reflux disease)     "occas"  . Chronic back pain     hx of  . Rheumatoid arteritis   . 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 Cancer Mother     Multiple Myeloma  . Alcohol abuse Father    Past Surgical History  Procedure Laterality Date  . Vein surgery      Left leg, Dr. Wende Neighbors  . Achilles tendon surgery    . Tonsillectomy    . Wisdom extractions    . Total knee arthroplasty  12/04/2012    Procedure: TOTAL KNEE ARTHROPLASTY;  Surgeon: Vickey Huger, MD;  Location: [facility];  Service: Orthopedics;  Laterality: Left;  left total knee arthroplasty   Social History   Social History  . Marital Status: Married    Spouse Name: N/A  . Number of Children: N/A  . Years of Education: N/A   Social History Main Topics  . Smoking status: Never Smoker   . Smokeless tobacco: Former Systems developer  . Alcohol Use: Yes     Comment: "social"  . Drug Use: No  . Sexual Activity: Not Asked   Other Topics  Concern  . 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 Treadmill 5 days a week   Likes to [employer]    Review of Systems  Constitutional: Negative for fever, chills, activity change, appetite change, fatigue and unexpected weight change.  Eyes: Negative for visual disturbance.  Respiratory: Negative for cough and shortness of breath.   Cardiovascular: Negative for chest pain, palpitations and leg swelling.  Gastrointestinal: Negative for nausea, vomiting, abdominal pain, diarrhea, constipation and abdominal distention.  Genitourinary: Negative for dysuria, urgency and difficulty urinating.  Musculoskeletal: Positive for arthralgias. Negative for myalgias and gait problem.  Skin: Negative for color change and rash.  Hematological: Negative for adenopathy.  Psychiatric/Behavioral: Negative for sleep disturbance and dysphoric mood. The patient is not nervous/anxious.        Objective:    BP 140/82 mmHg  Pulse 66  Temp(Src) 98.3 F (36.8 C) (Oral)  Ht '5\' 10"'  (1.778 m)  Wt 203 lb 6 oz (92.25 kg)  BMI 29.18  kg/m2  SpO2 97% Physical Exam  Constitutional: He is oriented to person, place, and time. He appears well-developed and well-nourished. No distress.  HENT:  Head: Normocephalic and atraumatic.  Right Ear: External ear normal.  Left Ear: External ear normal.  Nose: Nose normal.  Mouth/Throat: Oropharynx is clear and moist. No oropharyngeal exudate.  Eyes: Conjunctivae and EOM are normal. Pupils are equal, round, and reactive to light. Right eye exhibits no discharge. Left eye exhibits no discharge. No scleral icterus.  Neck: Normal range of motion. Neck supple. No tracheal deviation present. No thyromegaly present.  Cardiovascular: Normal rate, regular rhythm and normal heart sounds.  Exam reveals no gallop and no friction rub.   No  murmur heard. Pulmonary/Chest: Effort normal and breath sounds normal. No accessory muscle usage. No tachypnea. No respiratory distress. He has no decreased breath sounds. He has no wheezes. He has no rhonchi. He has no rales. He exhibits no tenderness.  Musculoskeletal: Normal range of motion. He exhibits no edema.  Lymphadenopathy:    He has no cervical adenopathy.  Neurological: He is alert and oriented to person, place, and time. No cranial nerve deficit. Coordination normal.  Skin: Skin is warm and dry. No rash noted. He is not diaphoretic. No erythema. No pallor.  Psychiatric: He has a normal mood and affect. His behavior is normal. Judgment and thought content normal.          Assessment & Plan:   Problem List Items Addressed This Visit      Unprioritized   Hypertension - Primary    BP Readings from Last 3 Encounters:  10/17/15 140/82  12/11/13 126/70  12/05/12 109/58   BP elevated on automated cuff but normal on manual cuff. Will have him check at his local pharmacy. Follow up here for repeat BP check in 2 weeks if BP continues to be >140/90. Renal function with labs.       Relevant Medications   losartan (COZAAR) 100 MG tablet   Other Relevant Orders   Comprehensive metabolic panel   Lipid panel   Rheumatoid arthritis ([place])    Continues to follow up with rheumatology. Continue Methotrexate.      Relevant Medications   meloxicam (MOBIC) 15 MG tablet   Other Relevant Orders   CBC with Differential/Platelet   Vitamin D deficiency disease    Will check Vit D with labs today.      Relevant Orders   VITAMIN D 25 Hydroxy (Vit-D Deficiency, Fractures)       Return in about 2 weeks (around 10/31/2015) for Recheck of Blood Pressure.

## 2015-10-17 NOTE — Patient Instructions (Signed)
Please have blood pressure checked at [facility] next week.  Follow up in 2 weeks for repeat check here.

## 2015-10-17 NOTE — Assessment & Plan Note (Signed)
BP Readings from Last 3 Encounters:  10/17/15 140/82  12/11/13 126/70  12/05/12 109/58   BP elevated on automated cuff but normal on manual cuff. Will have him check at his local pharmacy. Follow up here for repeat BP check in 2 weeks if BP continues to be >140/90. Renal function with labs.

## 2015-10-17 NOTE — Assessment & Plan Note (Signed)
Continues to follow up with rheumatology. Continue Methotrexate.

## 2015-10-17 NOTE — Progress Notes (Signed)
Pre visit review using our clinic review tool, if applicable. No additional management support is needed unless otherwise documented below in the visit note. 

## 2015-10-17 NOTE — Assessment & Plan Note (Signed)
Will check Vit D with labs today. 

## 2015-11-04 ENCOUNTER — Telehealth: Payer: Self-pay | Admitting: *Deleted

## 2015-11-04 ENCOUNTER — Other Ambulatory Visit (INDEPENDENT_AMBULATORY_CARE_PROVIDER_SITE_OTHER): Payer: PPO

## 2015-11-04 DIAGNOSIS — N179 Acute kidney failure, unspecified: Secondary | ICD-10-CM | POA: Diagnosis not present

## 2015-11-04 LAB — BASIC METABOLIC PANEL
BUN: 21 mg/dL (ref 6–23)
CALCIUM: 9.4 mg/dL (ref 8.4–10.5)
CO2: 30 mEq/L (ref 19–32)
CREATININE: 1.15 mg/dL (ref 0.40–1.50)
Chloride: 104 mEq/L (ref 96–112)
GFR: 67.26 mL/min (ref >60.00)
GLUCOSE: 91 mg/dL (ref 70–99)
Potassium: 4.6 mEq/L (ref 3.5–5.1)
Sodium: 139 mEq/L (ref 135–145)

## 2015-11-04 NOTE — Telephone Encounter (Signed)
Bmp for acute renal insufficiency

## 2015-11-04 NOTE — Telephone Encounter (Signed)
Labs and dx?  

## 2015-11-14 ENCOUNTER — Other Ambulatory Visit: Payer: Self-pay | Admitting: Internal Medicine

## 2015-12-02 DIAGNOSIS — L821 Other seborrheic keratosis: Secondary | ICD-10-CM | POA: Diagnosis not present

## 2015-12-02 DIAGNOSIS — C44619 Basal cell carcinoma of skin of left upper limb, including shoulder: Secondary | ICD-10-CM | POA: Diagnosis not present

## 2015-12-02 DIAGNOSIS — X32XXXA Exposure to sunlight, initial encounter: Secondary | ICD-10-CM | POA: Diagnosis not present

## 2015-12-02 DIAGNOSIS — D485 Neoplasm of uncertain behavior of skin: Secondary | ICD-10-CM | POA: Diagnosis not present

## 2015-12-02 DIAGNOSIS — Z85828 Personal history of other malignant neoplasm of skin: Secondary | ICD-10-CM | POA: Diagnosis not present

## 2015-12-02 DIAGNOSIS — L570 Actinic keratosis: Secondary | ICD-10-CM | POA: Diagnosis not present

## 2015-12-02 DIAGNOSIS — Z08 Encounter for follow-up examination after completed treatment for malignant neoplasm: Secondary | ICD-10-CM | POA: Diagnosis not present

## 2015-12-12 ENCOUNTER — Encounter: Payer: Self-pay | Admitting: *Deleted

## 2015-12-25 DIAGNOSIS — M0589 Other rheumatoid arthritis with rheumatoid factor of multiple sites: Secondary | ICD-10-CM | POA: Diagnosis not present

## 2015-12-25 DIAGNOSIS — Z79899 Other long term (current) drug therapy: Secondary | ICD-10-CM | POA: Diagnosis not present

## 2016-01-08 ENCOUNTER — Other Ambulatory Visit: Payer: Self-pay | Admitting: Internal Medicine

## 2016-01-09 NOTE — Telephone Encounter (Signed)
refillld 11/2015 and last seen 10/2015. Please advise?

## 2016-01-16 DIAGNOSIS — C44619 Basal cell carcinoma of skin of left upper limb, including shoulder: Secondary | ICD-10-CM | POA: Diagnosis not present

## 2016-01-16 DIAGNOSIS — L905 Scar conditions and fibrosis of skin: Secondary | ICD-10-CM | POA: Diagnosis not present

## 2016-04-15 ENCOUNTER — Other Ambulatory Visit: Payer: Self-pay | Admitting: Internal Medicine

## 2016-04-20 DIAGNOSIS — D045 Carcinoma in situ of skin of trunk: Secondary | ICD-10-CM | POA: Diagnosis not present

## 2016-04-20 DIAGNOSIS — Z85828 Personal history of other malignant neoplasm of skin: Secondary | ICD-10-CM | POA: Diagnosis not present

## 2016-04-20 DIAGNOSIS — X32XXXA Exposure to sunlight, initial encounter: Secondary | ICD-10-CM | POA: Diagnosis not present

## 2016-04-20 DIAGNOSIS — D485 Neoplasm of uncertain behavior of skin: Secondary | ICD-10-CM | POA: Diagnosis not present

## 2016-04-20 DIAGNOSIS — C44719 Basal cell carcinoma of skin of left lower limb, including hip: Secondary | ICD-10-CM | POA: Diagnosis not present

## 2016-04-20 DIAGNOSIS — L570 Actinic keratosis: Secondary | ICD-10-CM | POA: Diagnosis not present

## 2016-05-07 ENCOUNTER — Other Ambulatory Visit: Payer: Self-pay | Admitting: Internal Medicine

## 2016-06-04 DIAGNOSIS — M7541 Impingement syndrome of right shoulder: Secondary | ICD-10-CM | POA: Diagnosis not present

## 2016-06-04 DIAGNOSIS — M12812 Other specific arthropathies, not elsewhere classified, left shoulder: Secondary | ICD-10-CM | POA: Diagnosis not present

## 2016-06-17 ENCOUNTER — Emergency Department
Admission: EM | Admit: 2016-06-17 | Discharge: 2016-06-17 | Disposition: A | Discharge status: Home / Self Care | Payer: PPO | Attending: Emergency Medicine | Admitting: Emergency Medicine

## 2016-06-17 ENCOUNTER — Encounter: Payer: Self-pay | Admitting: *Deleted

## 2016-06-17 DIAGNOSIS — Z85828 Personal history of other malignant neoplasm of skin: Secondary | ICD-10-CM | POA: Insufficient documentation

## 2016-06-17 DIAGNOSIS — E860 Dehydration: Secondary | ICD-10-CM | POA: Diagnosis not present

## 2016-06-17 DIAGNOSIS — Z79899 Other long term (current) drug therapy: Secondary | ICD-10-CM | POA: Insufficient documentation

## 2016-06-17 DIAGNOSIS — R531 Weakness: Secondary | ICD-10-CM | POA: Diagnosis not present

## 2016-06-17 DIAGNOSIS — R55 Syncope and collapse: Secondary | ICD-10-CM | POA: Diagnosis not present

## 2016-06-17 DIAGNOSIS — N179 Acute kidney failure, unspecified: Secondary | ICD-10-CM | POA: Diagnosis not present

## 2016-06-17 DIAGNOSIS — I10 Essential (primary) hypertension: Secondary | ICD-10-CM | POA: Diagnosis not present

## 2016-06-17 DIAGNOSIS — T675XXA Heat exhaustion, unspecified, initial encounter: Secondary | ICD-10-CM | POA: Diagnosis not present

## 2016-06-17 DIAGNOSIS — Z87891 Personal history of nicotine dependence: Secondary | ICD-10-CM | POA: Insufficient documentation

## 2016-06-17 LAB — BASIC METABOLIC PANEL
Anion gap: 8 (ref 5–15)
BUN: 26 mg/dL — AB (ref 6–20)
CALCIUM: 9.6 mg/dL (ref 8.9–10.3)
CO2: 24 mmol/L (ref 22–32)
CREATININE: 2.33 mg/dL — AB (ref 0.61–1.24)
Chloride: 107 mmol/L (ref 101–111)
GFR calc Af Amer: 31 mL/min — ABNORMAL LOW (ref >60)
GFR, EST NON AFRICAN AMERICAN: 27 mL/min — AB (ref >60)
GLUCOSE: 144 mg/dL — AB (ref 65–99)
Potassium: 3.7 mmol/L (ref 3.5–5.1)
SODIUM: 139 mmol/L (ref 135–145)

## 2016-06-17 LAB — CBC
HCT: 41.9 % (ref 40.0–52.0)
HEMOGLOBIN: 14.2 g/dL (ref 13.0–18.0)
MCH: 33.1 pg (ref 26.0–34.0)
MCHC: 33.9 g/dL (ref 32.0–36.0)
MCV: 97.7 fL (ref 80.0–100.0)
Platelets: 221 10*3/uL (ref 150–440)
RBC: 4.29 MIL/uL — AB (ref 4.40–5.90)
RDW: 13.8 % (ref 11.5–14.5)
WBC: 10 10*3/uL (ref 3.8–10.6)

## 2016-06-17 LAB — COMPREHENSIVE METABOLIC PANEL
ALK PHOS: 36 U/L — AB (ref 38–126)
ALT: 27 U/L (ref 17–63)
AST: 25 U/L (ref 15–41)
Albumin: 3.7 g/dL (ref 3.5–5.0)
Anion gap: 6 (ref 5–15)
BILIRUBIN TOTAL: 1.1 mg/dL (ref 0.3–1.2)
BUN: 24 mg/dL — AB (ref 6–20)
CALCIUM: 9 mg/dL (ref 8.9–10.3)
CO2: 24 mmol/L (ref 22–32)
CREATININE: 1.74 mg/dL — AB (ref 0.61–1.24)
Chloride: 110 mmol/L (ref 101–111)
GFR calc Af Amer: 45 mL/min — ABNORMAL LOW (ref >60)
GFR, EST NON AFRICAN AMERICAN: 39 mL/min — AB (ref >60)
GLUCOSE: 112 mg/dL — AB (ref 65–99)
POTASSIUM: 4.6 mmol/L (ref 3.5–5.1)
Sodium: 140 mmol/L (ref 135–145)
TOTAL PROTEIN: 6.1 g/dL — AB (ref 6.5–8.1)

## 2016-06-17 MED ORDER — SODIUM CHLORIDE 0.9 % IV BOLUS (SEPSIS)
1000.0000 mL | Freq: Once | INTRAVENOUS | Status: AC
  Administered 2016-06-17: 1000 mL via INTRAVENOUS

## 2016-06-17 NOTE — ED Provider Notes (Signed)
[facility] Emergency Department Provider Note  ____________________________________________  Time seen: Approximately 4:20 PM  I have reviewed the triage vital signs and the nursing notes.   HISTORY  Chief Complaint Loss of Consciousness    HPI Julian Pineda is a 70 y.o. male who reports syncope 2 after playing 18 holes of golf today. Currently he only feels fatigued. He reports that he started playing golf at around 10 AM and continued through 2 PM. 90 and sunny outside today. About 12 holes in, he started feeling lightheaded but continued to play. He was trying to drink water and Gatorade, but continued to feel worse and worse as they continued playing. No chest pain headache shortness of breath abdominal pain or back pain. After finishing and going inside, he sat down in a chair and passed out twice. No new symptoms afterward. No vision changes numbness tingling or weakness. He has had a history of similar symptoms while playing golf, but in the past he stopped around early and did not pass out. Has a history of hypertension and rheumatoid arthritis at baseline without any changes in condition.  With some IV fluids and sitting in the air conditioning here, he is starting to feel better and symptoms are completely resolving.   Past Medical History:  Diagnosis Date  . 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 Chronic back pain    hx of  . GERD (gastroesophageal reflux disease)    "occas"  . Hypertension    sees Dr. Jackalyn Lombard, [place]  . 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 Prostate disease    followed by Dr. Jacqlyn Larsen , s/p biopsy complicated by infection  . Rheumatoid arteritis   . Rosacea    Dr. Sharlett Iles     Patient Active Problem List   Diagnosis Date Noted  . Rheumatoid arthritis ([place]) 02/16/2012  . Vitamin D deficiency disease 02/16/2012  . Hypertension 02/16/2012     Past Surgical History:  Procedure  Laterality Date  . ACHILLES TENDON SURGERY    . TONSILLECTOMY    . TOTAL KNEE ARTHROPLASTY  12/04/2012   Procedure: TOTAL KNEE ARTHROPLASTY;  Surgeon: Vickey Huger, MD;  Location: [facility];  Service: Orthopedics;  Laterality: Left;  left total knee arthroplasty  . VEIN SURGERY     Left leg, Dr. Wende Neighbors  . wisdom extractions       Prior to Admission medications   Medication Sig Start Date End Date Taking? Authorizing Provider  folic acid (FOLVITE) 1 MG tablet TAKE 1 TABLET EVERY DAY 09/15/15   Jackolyn Confer, MD  losartan (COZAAR) 100 MG tablet Take 1 tablet (100 mg total) by mouth daily. 10/17/15   Jackolyn Confer, MD  meloxicam (MOBIC) 15 MG tablet TAKE 1 TABLET EVERY DAY WITH FOOD 05/07/16   Jackolyn Confer, MD  methotrexate 50 MG/2ML injection INJECT 1ML WEEKLY 04/15/16   Jackolyn Confer, MD     Allergies Penicillins cross reactors   Family History  Problem Relation Age of Onset  . Cancer Mother     Multiple Myeloma  . Alcohol abuse Father     Social History Social History  Substance Use Topics  . Smoking status: Never Smoker  . Smokeless tobacco: Former Systems developer  . Alcohol use Yes     Comment: "social"    Review of Systems  Constitutional:   No fever or chills.  Cardiovascular:   No chest pain. Respiratory:  No dyspnea or cough. Gastrointestinal:   Negative for abdominal pain, vomiting and diarrhea.  Neurological:   Negative for headaches. Positive syncope today 10-point ROS otherwise negative.  ____________________________________________   PHYSICAL EXAM:  VITAL SIGNS: ED Triage Vitals  Enc Vitals Group     BP 06/17/16 1550 112/73     Pulse Rate 06/17/16 1550 69     Resp 06/17/16 1550 18     Temp 06/17/16 1550 97.6 F (36.4 C)     Temp Source 06/17/16 1550 Oral     SpO2 06/17/16 1550 98 %     Weight 06/17/16 1551 195 lb (88.5 kg)     Height 06/17/16 1551 _0  (1.778 m)     Head Circumference --      Peak Flow --      Pain Score --      Pain Loc  --      Pain Edu? --      Excl. in [place]? --     Vital signs reviewed, nursing assessments reviewed.   Constitutional:   Alert and oriented. Well appearing and in no distress. Eyes:   No scleral icterus. No conjunctival pallor. PERRL. EOMI.  No nystagmus. ENT   Head:   Normocephalic and atraumatic.   Nose:   No congestion/rhinnorhea. No septal hematoma   Mouth/Throat:   Dry mucous membranes, no pharyngeal erythema. No peritonsillar mass.    Neck:   No stridor. No SubQ emphysema. No meningismus. Hematological/Lymphatic/Immunilogical:   No cervical lymphadenopathy. Cardiovascular:   RRR. Symmetric bilateral radial and DP pulses.  No murmurs.  Respiratory:   Normal respiratory effort without tachypnea nor retractions. Breath sounds are clear and equal bilaterally. No wheezes/rales/rhonchi. Gastrointestinal:   Soft and nontender. Non distended. There is no CVA tenderness.  No rebound, rigidity, or guarding. Genitourinary:   deferred Musculoskeletal:   Nontender with normal range of motion in all extremities. No joint effusions.  No lower extremity tenderness.  No edema. Neurologic:   Normal speech and language.  CN 2-10 normal. Motor grossly intact. No gross focal neurologic deficits are appreciated.  Skin:    Skin is warm, dry and intact. No rash noted.  No petechiae, purpura, or bullae.  ____________________________________________    LABS (pertinent positives/negatives) (all labs ordered are listed, but only abnormal results are displayed) Labs Reviewed  BASIC METABOLIC PANEL - Abnormal; Notable for the following:       Result Value   Glucose, Bld 144 (*)    BUN 26 (*)    Creatinine, Ser 2.33 (*)    GFR calc non Af Amer 27 (*)    GFR calc Af Amer 31 (*)    All other components within normal limits  CBC - Abnormal; Notable for the following:    RBC 4.29 (*)    All other components within normal limits  COMPREHENSIVE METABOLIC PANEL - Abnormal; Notable for the  following:    Glucose, Bld 112 (*)    BUN 24 (*)    Creatinine, Ser 1.74 (*)    Total Protein 6.1 (*)    Alkaline Phosphatase 36 (*)    GFR calc non Af Amer 39 (*)    GFR calc Af Amer 45 (*)    All other components within normal limits  URINALYSIS COMPLETEWITH MICROSCOPIC ([facility] ONLY)   ____________________________________________   EKG  Interpreted by me Normal sinus rhythm rate of 68. Normal axis intervals QRS ST segments and T waves. Some slight J-point elevation in the high lateral  leads. This is completely unchanged from 11/10/2012.  ____________________________________________    RADIOLOGY    ____________________________________________   PROCEDURES Procedures  ____________________________________________   INITIAL IMPRESSION / ASSESSMENT AND PLAN / ED COURSE  Pertinent labs & imaging results that were available during my care of the patient were reviewed by me and considered in my medical decision making (see chart for details).  Patient's well-appearing no acute distress, presents with syncope highly likely due to dehydration and peripheral vasodilation due to prolonged activity in the sun and heat today. We'll check labs and IV fluids. No other concerning symptoms, no pain no exertional symptoms. Low suspicion for stroke ACS PE TAD AAA. Patient does report that he is feeling much better and feels comfortable with going home. He is happy to wait for IV fluids and lab results.     Clinical Course    ----------------------------------------- 7:55 PM on 06/17/2016 -----------------------------------------  Initial creatinine 2.3. Patient given 2 L IV saline, feels completely asymptomatic. Ambulatory. Repeat creatinine 1.7. Follow-up with primary care for further monitoring. All appears to be related  to dehydration and heat-related illness. ____________________________________________   FINAL CLINICAL IMPRESSION(S) / ED DIAGNOSES  Final diagnoses:   Syncope, unspecified syncope type  Dehydration  AKI (acute kidney injury) ([place])       Portions of this note were generated with dragon dictation software. Dictation errors may occur despite best attempts at proofreading.    Carrie Mew, MD 06/17/16 [phone]

## 2016-06-17 NOTE — ED Notes (Signed)
EDP at bedside  

## 2016-06-17 NOTE — ED Notes (Signed)
Pt discharged to home.  Family member driving.  Discharge instructions reviewed.  Verbalized understanding.  No questions or concerns at this time.  Teach back verified.  Pt in NAD.  No items left in ED.   

## 2016-06-17 NOTE — ED Notes (Signed)
Pt states that he feels much better and that he is not weak or dizzy at this time.  Pt states that he is ready to go home.  Took repeat blood work and sent to the lab and educated patient that the results need to come back before discharge.  Pt and wife verbalized understanding.  Educated to call with any needs.  Verbalized understanding.

## 2016-06-17 NOTE — ED Triage Notes (Signed)
[place] a syncopal episode after 18 holes of golf, pt states he had drank gatorade and water throught out the day, pt arrives diaphoretic and clammy, awake and alert, denies any pain but states feeling lightheaded

## 2016-06-17 NOTE — ED Notes (Signed)
Pt resting in bed, family at bedside, pt awake and alert in no acute distress 

## 2016-06-23 DIAGNOSIS — Z79899 Other long term (current) drug therapy: Secondary | ICD-10-CM | POA: Diagnosis not present

## 2016-06-23 DIAGNOSIS — M0589 Other rheumatoid arthritis with rheumatoid factor of multiple sites: Secondary | ICD-10-CM | POA: Diagnosis not present

## 2016-06-24 DIAGNOSIS — C44719 Basal cell carcinoma of skin of left lower limb, including hip: Secondary | ICD-10-CM | POA: Diagnosis not present

## 2016-06-24 DIAGNOSIS — L905 Scar conditions and fibrosis of skin: Secondary | ICD-10-CM | POA: Diagnosis not present

## 2016-07-08 DIAGNOSIS — D045 Carcinoma in situ of skin of trunk: Secondary | ICD-10-CM | POA: Diagnosis not present

## 2016-07-09 ENCOUNTER — Encounter: Payer: Self-pay | Admitting: Family Medicine

## 2016-07-09 ENCOUNTER — Ambulatory Visit (INDEPENDENT_AMBULATORY_CARE_PROVIDER_SITE_OTHER): Payer: PPO | Admitting: Family Medicine

## 2016-07-09 DIAGNOSIS — Z0000 Encounter for general adult medical examination without abnormal findings: Secondary | ICD-10-CM | POA: Diagnosis not present

## 2016-07-09 DIAGNOSIS — Z0001 Encounter for general adult medical examination with abnormal findings: Secondary | ICD-10-CM

## 2016-07-09 NOTE — Patient Instructions (Addendum)
Nice to meet you. Please continue with diet and exercise. We will have you fax your recent lab results from [facility] and then determine what other blood tests need to be ordered. If you have further issues with passing out please seek medical attention. It is important to stay well hydrated prior to doing physical activity. I would consider getting a tetanus vaccination 3 her pharmacy if he would like.

## 2016-07-09 NOTE — Progress Notes (Signed)
Pre visit review using our clinic review tool, if applicable. No additional management support is needed unless otherwise documented below in the visit note. 

## 2016-07-10 DIAGNOSIS — Z0001 Encounter for general adult medical examination with abnormal findings: Secondary | ICD-10-CM | POA: Insufficient documentation

## 2016-07-10 DIAGNOSIS — Z0000 Encounter for general adult medical examination without abnormal findings: Secondary | ICD-10-CM | POA: Insufficient documentation

## 2016-07-10 NOTE — Progress Notes (Signed)
Julian Rumps, MD Phone: [phone]  Julian Pineda is a 70 y.o. male who presents today for physical exam.  Diet consists of anything and everything. He does not eat as many fruits or vegetables as he should. Does drink some sweet tea though only with meals. Exercises by lifting weights 2 days a week. Plays golf several days a week as well. No prior hepatitis C testing. Colonoscopy is up-to-date and he is due for one 01/31/17.  Due for PSA screening as well. Has had a biopsy in the past that was negative. Declines flu shot, Zostavax, and pneumonia vaccine. He would consider tetanus vaccination though will get this at his pharmacy. No tobacco use. 14 alcoholic beverages per week. No illicit drug use. Sees the dentist twice a year. Has not seen an ophthalmologist as he has no vision issues. 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 He was evaluated in the emergency room several weeks ago after becoming dehydrated while playing golf in 90 heat and passing out. He had evaluation and it was felt as though his syncopal episode was related to dehydration and vasodilatation. No issues since then. Symptoms prior to passing out included feeling lightheaded though he had no cardiac or neurological symptoms. His symptoms improved with IV hydration in the ED.  Active Ambulatory Problems    Diagnosis Date Noted  . Rheumatoid arthritis ([facility]) 02/16/2012  . Vitamin D deficiency disease 02/16/2012  . Hypertension 02/16/2012  . Encounter for general adult medical examination with abnormal findings 07/10/2016   Resolved Ambulatory Problems    Diagnosis Date Noted  . Osteoarthritis of left knee 08/18/2012  . Preop examination 11/10/2012  . Erectile dysfunction 12/11/2013   Past Medical History:  Diagnosis Date  . Cancer ([facility])   . Chronic back pain   . GERD (gastroesophageal reflux disease)   . Hypertension   . 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 Prostate disease   . Rheumatoid arteritis   . Rosacea     Family History  Problem Relation Age of Onset  . Cancer Mother     Multiple Myeloma  . Alcohol abuse Father     Social History   Social History  . Marital status: Married    Spouse name: N/A  . Number of children: N/A  . Years of education: N/A   Occupational History  . Not on file.   Social History Main Topics  . Smoking status: Never Smoker  . Smokeless tobacco: Former Systems developer  . Alcohol use Yes     Comment: "social"  . Drug use: No  . Sexual activity: Not on file   Other Topics Concern  . 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 Treadmill 5 days a week   Likes to [employer]    ROS  General:  Negative for nexplained weight loss, fever Skin: Positive for new or changing mole (recently removed), sore that won't heal HEENT: Negative for trouble hearing, trouble seeing, ringing in ears, mouth sores, hoarseness, change in voice, dysphagia. CV:  Negative for chest pain, dyspnea, edema, palpitations Resp: Negative for cough, dyspnea, hemoptysis GI: Negative for nausea, vomiting, diarrhea, constipation, abdominal pain, melena, hematochezia. GU: Negative for dysuria, incontinence, urinary hesitance, hematuria, vaginal or  penile discharge, polyuria, sexual difficulty, lumps in testicle or breasts MSK: Negative for muscle cramps or aches, joint pain or swelling Neuro: Negative for headaches, weakness, numbness, dizziness, passing out/fainting Psych: Negative for depression, anxiety, memory problems  Objective  Physical Exam Vitals:   07/09/16 1528  BP: 118/78  Pulse: 88  Temp: 98.5 F (36.9 C)    BP Readings from Last 3 Encounters:  07/09/16 118/78  06/17/16 131/80  10/17/15 140/82   Wt Readings from Last 3 Encounters:  07/09/16 204 lb 6.4  oz (92.7 kg)  06/17/16 195 lb (88.5 kg)  10/17/15 203 lb 6 oz (92.3 kg)    Physical Exam  Constitutional: No distress.  HENT:  Head: Normocephalic and atraumatic.  Mouth/Throat: Oropharynx is clear and moist.  Eyes: Conjunctivae are normal. Pupils are equal, round, and reactive to light.  Neck: Neck supple.  Cardiovascular: Normal rate, regular rhythm and normal heart sounds.   Pulmonary/Chest: Effort normal and breath sounds normal.  Abdominal: Soft. Bowel sounds are normal. He exhibits no distension. There is no tenderness. There is no rebound and no guarding.  Genitourinary:  Genitourinary Comments: Patient deferred prostate exam  Musculoskeletal: He exhibits no edema.  Lymphadenopathy:    He has no cervical adenopathy.  Neurological: He is alert. Gait normal.  Skin: Skin is warm and dry. He is not diaphoretic.  Psychiatric: Mood and affect normal.     Assessment/Plan:   Encounter for general adult medical examination with abnormal findings Overall patient is doing quite well. Staying physically active. Recently got dehydrated and had a syncopal episode related to this. I discussed the importance of pre-hydration prior to physical activity. Advised if he developed any other symptoms or recurrence he would need further evaluation. We will obtain lab work at a future date as he reports he recently had lab work done through his rheumatologist. He will bring these labs by the office so we can determine what else needs to be collected. I encouraged vaccinations to get updated though he deferred these at this time. He will continue to follow with dermatology. He'll be due for colonoscopy next year.   Julian Rumps, MD [place]

## 2016-07-10 NOTE — Assessment & Plan Note (Signed)
Overall patient is doing quite well. Staying physically active. Recently got dehydrated and had a syncopal episode related to this. I discussed the importance of pre-hydration prior to physical activity. Advised if he developed any other symptoms or recurrence he would need further evaluation. We will obtain lab work at a future date as he reports he recently had lab work done through his rheumatologist. He will bring these labs by the office so we can determine what else needs to be collected. I encouraged vaccinations to get updated though he deferred these at this time. He will continue to follow with dermatology. He'll be due for colonoscopy next year.

## 2016-07-12 ENCOUNTER — Telehealth: Payer: Self-pay | Admitting: Family Medicine

## 2016-07-12 ENCOUNTER — Other Ambulatory Visit: Payer: Self-pay | Admitting: Family Medicine

## 2016-07-12 DIAGNOSIS — Z13220 Encounter for screening for lipoid disorders: Secondary | ICD-10-CM

## 2016-07-12 DIAGNOSIS — Z125 Encounter for screening for malignant neoplasm of prostate: Secondary | ICD-10-CM

## 2016-07-12 DIAGNOSIS — Z1159 Encounter for screening for other viral diseases: Secondary | ICD-10-CM

## 2016-07-12 NOTE — Telephone Encounter (Signed)
Pt dropped off lab results that Dr. Caryl Bis requested. Envelope is up front in colored folder.

## 2016-07-13 NOTE — Telephone Encounter (Signed)
Labs reviewed. Patient needs PSA, hep C, and lipid panel checked. Please get him set up for fasting labs. Orders have been placed. Thanks.

## 2016-07-13 NOTE — Telephone Encounter (Signed)
Labs in blue folder

## 2016-07-14 NOTE — Telephone Encounter (Signed)
Scheduled patient for labs.

## 2016-07-28 ENCOUNTER — Other Ambulatory Visit (INDEPENDENT_AMBULATORY_CARE_PROVIDER_SITE_OTHER): Payer: PPO

## 2016-07-28 DIAGNOSIS — Z1159 Encounter for screening for other viral diseases: Secondary | ICD-10-CM | POA: Diagnosis not present

## 2016-07-28 DIAGNOSIS — Z125 Encounter for screening for malignant neoplasm of prostate: Secondary | ICD-10-CM | POA: Diagnosis not present

## 2016-07-28 DIAGNOSIS — Z13220 Encounter for screening for lipoid disorders: Secondary | ICD-10-CM | POA: Diagnosis not present

## 2016-07-28 LAB — LIPID PANEL
CHOLESTEROL: 182 mg/dL (ref 0–200)
HDL: 73.9 mg/dL (ref >39.00)
LDL Cholesterol: 89 mg/dL (ref 0–99)
NonHDL: 108.18
TRIGLYCERIDES: 95 mg/dL (ref 0.0–149.0)
Total CHOL/HDL Ratio: 2
VLDL: 19 mg/dL (ref 0.0–40.0)

## 2016-07-28 LAB — PSA: PSA: 4.96 ng/mL — ABNORMAL HIGH (ref 0.10–4.00)

## 2016-07-28 LAB — HEPATITIS C ANTIBODY: HCV AB: NEGATIVE

## 2016-07-29 ENCOUNTER — Other Ambulatory Visit: Payer: Self-pay | Admitting: Family Medicine

## 2016-07-29 DIAGNOSIS — R972 Elevated prostate specific antigen [PSA]: Secondary | ICD-10-CM

## 2016-08-24 ENCOUNTER — Encounter: Payer: Self-pay | Admitting: Urology

## 2016-08-24 ENCOUNTER — Ambulatory Visit (INDEPENDENT_AMBULATORY_CARE_PROVIDER_SITE_OTHER): Payer: PPO | Admitting: Urology

## 2016-08-24 VITALS — BP 126/76 | HR 72 | Ht 70.0 in | Wt 203.4 lb

## 2016-08-24 DIAGNOSIS — R351 Nocturia: Secondary | ICD-10-CM | POA: Diagnosis not present

## 2016-08-24 DIAGNOSIS — R972 Elevated prostate specific antigen [PSA]: Secondary | ICD-10-CM | POA: Diagnosis not present

## 2016-08-24 DIAGNOSIS — C61 Malignant neoplasm of prostate: Secondary | ICD-10-CM | POA: Insufficient documentation

## 2016-08-24 DIAGNOSIS — Z8546 Personal history of malignant neoplasm of prostate: Secondary | ICD-10-CM | POA: Insufficient documentation

## 2016-08-24 NOTE — Progress Notes (Signed)
08/24/2016 10:24 AM   Julian Pineda 02-Dec-1945 [id number]  Referring provider: Leone Haven, MD [address] [place], [place] [address]  Chief Complaint  Patient presents with  . Elevated PSA    HPI: [name] is a 70yo seen today for evaluation of elevated PSA. His PSA on referral is 4.96.  PSA trend: 12/2013    3.7 12/2012    3.1 12/2011    2.8 12/2010    2.0  He has mild LUTS at baseline. He has nocturia 3x and frequency q 3-4 hours. No issues with ED.  He is not bothered by his symptoms. No hematuria. No other associated symptoms. No exacerbating/alleviating events.  He has a hx of prostate biopsy by Dr. Jacqlyn Pineda 8 years ago which was benign.  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     PMH: Past Medical History:  Diagnosis Date  . Cancer ([facility])    Squamous cell face, [facility], Dr. Lacinda Pineda  . Chronic back pain    hx of  . GERD (gastroesophageal reflux disease)    "occas"  . Hypertension    sees Dr. Jackalyn Pineda, [facility]  . 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 Prostate disease    followed by Dr. Jacqlyn Pineda , s/p biopsy complicated by infection  . Rheumatoid arteritis   . Rosacea    Dr. Sharlett Pineda    Surgical History: Past Surgical History:  Procedure Laterality Date  . ACHILLES TENDON SURGERY    . TONSILLECTOMY    . TOTAL KNEE ARTHROPLASTY  12/04/2012   Procedure: TOTAL KNEE ARTHROPLASTY;  Surgeon: Julian Huger, MD;  Location: [facility];  Service: Orthopedics;  Laterality: Left;  left total knee arthroplasty  . VEIN SURGERY     Left leg, Dr. Wende Pineda  . wisdom extractions      Home Medications:    Medication List       Accurate as of 08/24/16 10:24 AM. Always use your most recent med list.          celecoxib 200 MG capsule Commonly known as:  CELEBREX Take by mouth.   cholecalciferol 1000 units tablet Commonly known as:  VITAMIN D Take 1,000 Units by mouth daily.   clindamycin 150 MG capsule Commonly known as:  CLEOCIN Take 4 capsules  one hour prior to surgery.   folic acid 1 MG tablet Commonly known as:  FOLVITE TAKE 1 TABLET EVERY DAY   losartan 100 MG tablet Commonly known as:  COZAAR Take 1 tablet (100 mg total) by mouth daily.   meloxicam 15 MG tablet Commonly known as:  MOBIC TAKE 1 TABLET BY MOUTH DAILY WITH FOOD   methotrexate 50 MG/2ML injection INJECT 1ML WEEKLY       Allergies:  Allergies  Allergen Reactions  . Penicillins Cross Reactors Other (See Comments)    unknown    Family History: Family History  Problem Relation Age of Onset  . Cancer Mother     Multiple Myeloma  . Alcohol abuse Father   . Hematuria Neg Hx   . Prostate cancer Neg Hx   . Sickle cell anemia Neg Hx     Social History:  reports that he has never smoked. He has quit using smokeless tobacco. He reports that he drinks alcohol. He reports that he does not use drugs.  ROS: UROLOGY Frequent Urination?: Yes Hard to postpone urination?: No Burning/pain with urination?: No Get up at night to urinate?: Yes Leakage of urine?: No Urine  stream starts and stops?: No Trouble starting stream?: No Do you have to strain to urinate?: No Blood in urine?: No Urinary tract infection?: No Sexually transmitted disease?: No Injury to kidneys or bladder?: No Painful intercourse?: No Weak stream?: No Erection problems?: No Penile pain?: No  Gastrointestinal Nausea?: No Vomiting?: No Indigestion/heartburn?: No Diarrhea?: No Constipation?: No  Constitutional Fever: No Night sweats?: No Weight loss?: No Fatigue?: No  Skin Skin rash/lesions?: Yes Itching?: No  Eyes Blurred vision?: No Double vision?: No  Ears/Nose/Throat Sore throat?: No Sinus problems?: No  Hematologic/Lymphatic Swollen glands?: No Easy bruising?: No  Cardiovascular Leg swelling?: No Chest pain?: No  Respiratory Cough?: No Shortness of breath?: No  Endocrine Excessive thirst?: No  Musculoskeletal Back pain?: Yes Joint pain?:  Yes  Neurological Headaches?: No Dizziness?: No  Psychologic Depression?: No Anxiety?: No  Physical Exam: BP 126/76   Pulse 72   Ht _0  (1.778 m)   Wt 92.3 kg (203 lb 6.4 oz)   BMI 29.18 kg/m   Constitutional:  Alert and oriented, No acute distress. HEENT: [place] AT, moist mucus membranes.  Trachea midline, no masses. Cardiovascular: No clubbing, cyanosis, or edema. Respiratory: Normal respiratory effort, no increased work of breathing. GI: Abdomen is soft, nontender, nondistended, no abdominal masses GU: No CVA tenderness. Circumcised phallus, no masses/lesiosn on penis/testes/scrotum. Prostate 30g smooth, no induration, no tenderness. Normal rectal tone Skin: No rashes, bruises or suspicious lesions. Lymph: No cervical or inguinal adenopathy. Neurologic: Grossly intact, no focal deficits, moving all 4 extremities. Psychiatric: Normal mood and affect.  Laboratory Data: Lab Results  Component Value Date   WBC 10.0 06/17/2016   HGB 14.2 06/17/2016   HCT 41.9 06/17/2016   MCV 97.7 06/17/2016   PLT 221 06/17/2016    Lab Results  Component Value Date   CREATININE 1.74 (H) 06/17/2016    Lab Results  Component Value Date   PSA 4.96 (H) 07/28/2016    No results found for: TESTOSTERONE  No results found for: HGBA1C  Urinalysis    Component Value Date/Time   COLORURINE YELLOW 11/29/2012 0845   APPEARANCEUR CLEAR 11/29/2012 0845   LABSPEC 1.030 11/29/2012 0845   PHURINE 5.0 11/29/2012 0845   GLUCOSEU NEGATIVE 11/29/2012 0845   HGBUR NEGATIVE 11/29/2012 0845   BILIRUBINUR NEGATIVE 11/29/2012 0845   KETONESUR NEGATIVE 11/29/2012 0845   PROTEINUR NEGATIVE 11/29/2012 0845   UROBILINOGEN 0.2 11/29/2012 0845   NITRITE NEGATIVE 11/29/2012 0845   LEUKOCYTESUR NEGATIVE 11/29/2012 0845    Pertinent Imaging: none  Assessment & Plan:    1. Elevated PSA -free and total PSA in 3 months  2. Nocturia -fluid management  There are no diagnoses linked to this  encounter.  No Follow-up on file.  Nicolette Bang, MD  [facility] [address], [place], [place] [address] [phone]

## 2016-10-07 ENCOUNTER — Other Ambulatory Visit: Payer: Self-pay | Admitting: Family Medicine

## 2016-10-28 DIAGNOSIS — M0589 Other rheumatoid arthritis with rheumatoid factor of multiple sites: Secondary | ICD-10-CM | POA: Diagnosis not present

## 2016-11-11 DIAGNOSIS — D485 Neoplasm of uncertain behavior of skin: Secondary | ICD-10-CM | POA: Diagnosis not present

## 2016-11-11 DIAGNOSIS — Z85828 Personal history of other malignant neoplasm of skin: Secondary | ICD-10-CM | POA: Diagnosis not present

## 2016-11-11 DIAGNOSIS — B353 Tinea pedis: Secondary | ICD-10-CM | POA: Diagnosis not present

## 2016-11-11 DIAGNOSIS — L570 Actinic keratosis: Secondary | ICD-10-CM | POA: Diagnosis not present

## 2016-11-11 DIAGNOSIS — B354 Tinea corporis: Secondary | ICD-10-CM | POA: Diagnosis not present

## 2016-11-11 DIAGNOSIS — D0471 Carcinoma in situ of skin of right lower limb, including hip: Secondary | ICD-10-CM | POA: Diagnosis not present

## 2016-11-11 DIAGNOSIS — L821 Other seborrheic keratosis: Secondary | ICD-10-CM | POA: Diagnosis not present

## 2016-11-11 DIAGNOSIS — X32XXXA Exposure to sunlight, initial encounter: Secondary | ICD-10-CM | POA: Diagnosis not present

## 2016-11-24 ENCOUNTER — Ambulatory Visit: Payer: PPO

## 2016-11-29 ENCOUNTER — Encounter: Payer: Self-pay | Admitting: Urology

## 2016-11-29 ENCOUNTER — Ambulatory Visit: Payer: PPO | Admitting: Urology

## 2016-11-29 VITALS — BP 145/76 | HR 74 | Ht 69.0 in | Wt 207.3 lb

## 2016-11-29 DIAGNOSIS — R972 Elevated prostate specific antigen [PSA]: Secondary | ICD-10-CM

## 2016-11-29 LAB — URINALYSIS, COMPLETE
BILIRUBIN UA: NEGATIVE
Glucose, UA: NEGATIVE
LEUKOCYTES UA: NEGATIVE
Nitrite, UA: NEGATIVE
PROTEIN UA: NEGATIVE
SPEC GRAV UA: 1.025 (ref 1.005–1.030)
Urobilinogen, Ur: 0.2 mg/dL (ref 0.2–1.0)
pH, UA: 5 (ref 5.0–7.5)

## 2016-11-29 LAB — MICROSCOPIC EXAMINATION
BACTERIA UA: NONE SEEN
WBC, UA: NONE SEEN /hpf (ref 0–?)

## 2016-11-29 NOTE — Progress Notes (Signed)
11/29/2016 11:06 AM   Julian Pineda [birth year] [id number]  Referring provider: Leone Haven, MD [address] [place], [place] [address]  No chief complaint on file.   HPI:  Julian Pineda is a 71 yo seen today for evaluation of elevated PSA. His PSA on referral is 4.96. He has a hx of prostate biopsy by Dr. Jacqlyn Larsen 8 years ago which was benign. He "passed out" on the way home due to "infection". His brother is 67 mo older and has PCa. He is preparing for XRT and Dr. [name] put in gold seed markers recently.  PSA trend: 08/2016 4.96 -- normal DRE  12/2013    3.7 12/2012    3.1 12/2011    2.8 12/2010    2.0  He has mild LUTS at baseline. He has nocturia 3x and frequency q 3-4 hours. No issues with ED. He is not bothered by his symptoms.   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  Today, patient is seen for the above. A PSA was ordered, but not down. It was drawn today.    PMH: Past Medical History:  Diagnosis Date  . 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 Chronic back pain    hx of  . GERD (gastroesophageal reflux disease)    "occas"  . Hypertension    sees Dr. Jackalyn Lombard, [facility]  . 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 Prostate disease    followed by Dr. Jacqlyn Larsen , s/p biopsy complicated by infection  . Rheumatoid arteritis   . Rosacea    Dr. Sharlett Iles    Surgical History: Past Surgical History:  Procedure Laterality Date  . ACHILLES TENDON SURGERY    . TONSILLECTOMY    . TOTAL KNEE ARTHROPLASTY  12/04/2012   Procedure: TOTAL KNEE ARTHROPLASTY;  Surgeon: Vickey Huger, MD;  Location: [facility];  Service: Orthopedics;  Laterality: Left;  left total knee arthroplasty  . VEIN SURGERY     Left leg, Dr. Wende Neighbors  . wisdom extractions      Home Medications:  Allergies as of 11/29/2016      Reactions   Penicillins Cross Reactors Other (See Comments)   unknown      Medication List       Accurate as of 11/29/16 11:06 AM. Always use  your most recent med list.          celecoxib 200 MG capsule Commonly known as:  CELEBREX Take by mouth.   cholecalciferol 1000 units tablet Commonly known as:  VITAMIN D Take 1,000 Units by mouth daily.   clindamycin 150 MG capsule Commonly known as:  CLEOCIN Take 4 capsules one hour prior to surgery.   folic acid 1 MG tablet Commonly known as:  FOLVITE TAKE 1 TABLET EVERY DAY   losartan 100 MG tablet Commonly known as:  COZAAR Take 1 tablet (100 mg total) by mouth daily.   meloxicam 15 MG tablet Commonly known as:  MOBIC TAKE 1 TABLET BY MOUTH ONCE DAILY WITH FOOD   methotrexate 50 MG/2ML injection INJECT 1ML WEEKLY       Allergies:  Allergies  Allergen Reactions  . Penicillins Cross Reactors Other (See Comments)    unknown    Family History: Family History  Problem Relation Age of Onset  . Cancer Mother     Multiple Myeloma  . Alcohol abuse Father   . Hematuria Neg Hx   . Prostate cancer Neg Hx   .  Sickle cell anemia Neg Hx     Social History:  reports that he has never smoked. He has quit using smokeless tobacco. He reports that he drinks alcohol. He reports that he does not use drugs.  ROS:                                        Physical Exam: There were no vitals taken for this visit.  Constitutional:  Alert and oriented, No acute distress. HEENT: [place] AT, moist mucus membranes.  Trachea midline, no masses. Cardiovascular: No clubbing, cyanosis, or edema. Respiratory: Normal respiratory effort, no increased work of breathing. Skin: No rashes, bruises or suspicious lesions. Neurologic: Grossly intact, no focal deficits, moving all 4 extremities. Psychiatric: Normal mood and affect.  Laboratory Data: Lab Results  Component Value Date   WBC 10.0 06/17/2016   HGB 14.2 06/17/2016   HCT 41.9 06/17/2016   MCV 97.7 06/17/2016   PLT 221 06/17/2016    Lab Results  Component Value Date   CREATININE 1.74 (H) 06/17/2016     Lab Results  Component Value Date   PSA 4.96 (H) 07/28/2016    No results found for: TESTOSTERONE  No results found for: HGBA1C  Urinalysis    Component Value Date/Time   COLORURINE YELLOW 11/29/2012 0845   APPEARANCEUR CLEAR 11/29/2012 0845   LABSPEC 1.030 11/29/2012 0845   PHURINE 5.0 11/29/2012 0845   GLUCOSEU NEGATIVE 11/29/2012 0845   HGBUR NEGATIVE 11/29/2012 0845   BILIRUBINUR NEGATIVE 11/29/2012 0845   KETONESUR NEGATIVE 11/29/2012 0845   PROTEINUR NEGATIVE 11/29/2012 0845   UROBILINOGEN 0.2 11/29/2012 0845   NITRITE NEGATIVE 11/29/2012 0845   LEUKOCYTESUR NEGATIVE 11/29/2012 0845    Assessment & Plan:    Elevated PSA - The natural history of prostate cancer and ongoing controversy regarding screening and potential treatment outcomes of prostate cancer has been discussed with the patient. The meaning of a false positive PSA and a false negative PSA has been discussed. He indicates understanding of the limitations of this screening test. I had a long discussion with the patient on the nature of elevated PSA - benign vs malignant causes. We discussed age specific levels and that PCa can be seen on a biopsy with very low PSA levels (<=2.5). We discussed the nature risks and benefits of continued surveillance, other lab tests, imaging/MRI as well as prostate biopsy. We discussed the management of prostate cancer might include active surveillance or treatment depending on biopsy findings. All questions answered. He has a good understanding given his prior bx and he's involved with his brothers care. He also knows about Dr. Eliberto Ivory taking a "plane full" of guys to the [place] for Hi[facility]. He also asked about BPH and we discussed nature of PSAD and PSAV. If PSA rising will get MRI and consider bx.   There are no diagnoses linked to this encounter.  No Follow-up on file.  Festus Aloe, [facility] [address], [place], [place]  [address] [phone]

## 2016-11-30 ENCOUNTER — Telehealth: Payer: Self-pay

## 2016-11-30 DIAGNOSIS — R972 Elevated prostate specific antigen [PSA]: Secondary | ICD-10-CM

## 2016-11-30 LAB — PSA, TOTAL AND FREE
PSA FREE PCT: 12.3 %
PSA, Free: 0.87 ng/mL
Prostate Specific Ag, Serum: 7.1 ng/mL — ABNORMAL HIGH (ref 0.0–4.0)

## 2016-11-30 NOTE — Telephone Encounter (Signed)
Julian Aloe, MD  Lestine Box, LPN        Notify patient his PSA has risen to 7 -- schedule him for a prostate MRI as discussed to be done in next 2-3 weeks. Thanks.    Spoke with pt in reference to PSA and MRI. Pt voiced understanding. Will place MRI orders.

## 2016-12-08 ENCOUNTER — Other Ambulatory Visit: Payer: Self-pay | Admitting: Family Medicine

## 2016-12-08 NOTE — Telephone Encounter (Signed)
Last filled 10/17/15 by Dr.WAlker 90 3rf last OV 07/09/16

## 2016-12-08 NOTE — Telephone Encounter (Signed)
Refills sent to pharmacy. Patient needs his 6 month blood pressure follow-up scheduled sometime in the next month or so. Thanks.

## 2016-12-09 NOTE — Telephone Encounter (Signed)
Left message to return call 

## 2016-12-09 NOTE — Telephone Encounter (Signed)
Pt. Scheduled

## 2016-12-24 DIAGNOSIS — M19049 Primary osteoarthritis, unspecified hand: Secondary | ICD-10-CM | POA: Diagnosis not present

## 2016-12-24 DIAGNOSIS — R5382 Chronic fatigue, unspecified: Secondary | ICD-10-CM | POA: Diagnosis not present

## 2016-12-24 DIAGNOSIS — Z6829 Body mass index (BMI) 29.0-29.9, adult: Secondary | ICD-10-CM | POA: Diagnosis not present

## 2016-12-24 DIAGNOSIS — E663 Overweight: Secondary | ICD-10-CM | POA: Diagnosis not present

## 2016-12-24 DIAGNOSIS — Z79899 Other long term (current) drug therapy: Secondary | ICD-10-CM | POA: Diagnosis not present

## 2016-12-24 DIAGNOSIS — M0589 Other rheumatoid arthritis with rheumatoid factor of multiple sites: Secondary | ICD-10-CM | POA: Diagnosis not present

## 2016-12-24 LAB — CBC AND DIFFERENTIAL
HEMATOCRIT: 46 % (ref 41–53)
Hemoglobin: 15.5 g/dL (ref 13.5–17.5)
NEUTROS ABS: 3 /uL
Platelets: 259 10*3/uL (ref 150–399)
WBC: 4.7 10*3/mL

## 2016-12-24 LAB — BASIC METABOLIC PANEL
BUN: 24 mg/dL — AB (ref 4–21)
Creatinine: 1.2 mg/dL (ref 0.6–1.3)
Glucose: 66 mg/dL
Potassium: 4.2 mmol/L (ref 3.4–5.3)
Sodium: 142 mmol/L (ref 137–147)

## 2016-12-24 LAB — HEPATIC FUNCTION PANEL
ALT: 25 U/L (ref 10–40)
AST: 22 U/L (ref 14–40)
Alkaline Phosphatase: 47 U/L (ref 25–125)
BILIRUBIN, TOTAL: 0.8 mg/dL

## 2016-12-27 ENCOUNTER — Encounter: Payer: Self-pay | Admitting: Family Medicine

## 2017-01-04 DIAGNOSIS — D0471 Carcinoma in situ of skin of right lower limb, including hip: Secondary | ICD-10-CM | POA: Diagnosis not present

## 2017-01-05 ENCOUNTER — Other Ambulatory Visit: Payer: Self-pay | Admitting: Family Medicine

## 2017-01-05 NOTE — Telephone Encounter (Signed)
Last filled 10/07/16 30 2rf last OV 07/09/16

## 2017-01-07 NOTE — Telephone Encounter (Signed)
Please see if patient continues to take mobic, celebrex, and methotrexate. Thanks.

## 2017-01-11 NOTE — Telephone Encounter (Signed)
Left message to return call 

## 2017-01-19 ENCOUNTER — Ambulatory Visit (HOSPITAL_COMMUNITY)
Admission: RE | Admit: 2017-01-19 | Discharge: 2017-01-19 | Disposition: A | Discharge status: Home / Self Care | Payer: PPO | Source: Ambulatory Visit | Attending: Urology | Admitting: Urology

## 2017-01-19 DIAGNOSIS — K5730 Diverticulosis of large intestine without perforation or abscess without bleeding: Secondary | ICD-10-CM | POA: Insufficient documentation

## 2017-01-19 DIAGNOSIS — K5732 Diverticulitis of large intestine without perforation or abscess without bleeding: Secondary | ICD-10-CM | POA: Diagnosis not present

## 2017-01-19 DIAGNOSIS — R972 Elevated prostate specific antigen [PSA]: Secondary | ICD-10-CM | POA: Diagnosis not present

## 2017-01-19 DIAGNOSIS — N429 Disorder of prostate, unspecified: Secondary | ICD-10-CM | POA: Diagnosis not present

## 2017-01-19 MED ORDER — LIDOCAINE HCL 2 % EX GEL
CUTANEOUS | Status: AC
  Filled 2017-01-19: qty 30

## 2017-01-19 MED ORDER — GADOBENATE DIMEGLUMINE 529 MG/ML IV SOLN
20.0000 mL | Freq: Once | INTRAVENOUS | Status: AC | PRN
  Administered 2017-01-19: 19 mL via INTRAVENOUS

## 2017-01-19 NOTE — Telephone Encounter (Signed)
Patient states this is being managed by his rheumatologist

## 2017-01-20 ENCOUNTER — Telehealth: Payer: Self-pay

## 2017-01-20 NOTE — Telephone Encounter (Signed)
Spoke with Julian Pineda in reference to MRI results. Julian Pineda stated that he would like to speak with Dr. Junious Silk prior to making bx appt.

## 2017-01-20 NOTE — Telephone Encounter (Signed)
Festus Aloe, MD  Lestine Box, LPN        Notify patient his prostate MRI showed a few small areas suspicious for prostate cancer. I recommend we repeat a prostate biopsy. If he's ready, go ahead and set up for prostate biopsy or he can f/u with any MD in 1-3 weeks and review MRI results.    LMOM

## 2017-01-24 ENCOUNTER — Ambulatory Visit (INDEPENDENT_AMBULATORY_CARE_PROVIDER_SITE_OTHER): Payer: PPO | Admitting: Urology

## 2017-01-24 VITALS — BP 130/75 | HR 71 | Ht 70.0 in | Wt 206.0 lb

## 2017-01-24 DIAGNOSIS — R972 Elevated prostate specific antigen [PSA]: Secondary | ICD-10-CM | POA: Diagnosis not present

## 2017-01-24 NOTE — Progress Notes (Signed)
01/24/2017 8:41 AM   Julian Pineda 17-Oct-1946 [id number]  Referring provider: Leone Haven, MD [address] [place], [place] [address]  Chief Complaint  Patient presents with  . Follow-up    elevated PSA, MRI results     HPI: Mr Julian Pineda is a 71 yo seen today for evaluation of elevated PSA. His PSA on referral is 4.96. He has a hx of prostate biopsy by Dr. Jacqlyn Larsen 8 years ago which was benign. He "passed out" on the way home due to "infection". His brother is 68 mo older and has PCa. He is preparing for XRT and Dr. [name] put in gold seed markers.   PSA trend: 11/2016 PSA 7.1 -- Prostate MRI (01/19/2017) - Prostate 38.6 grams; 6 mm right mid lesion, 2.5 cm right mid to apex lesion -- PiRADs 5; capsule indistinct at the right apex possible ECE, Neg SV, Neg LN, Neg LAD, Neg bone met  08/2016 4.96 -- normal DRE  12/2013 3.7 12/2012 3.1 12/2011 2.8 12/2010 2.0  He has mild LUTS at baseline. He has nocturia 3x and frequency q 3-4 hours. No issues with ED. He is not bothered by his symptoms.   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  Today, patient is seen for the above. I reviewed his PSA from January which went up and his prostate MRI which showed suspicious lesions.   PMH: Past Medical History:  Diagnosis Date  . 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 Chronic back pain    hx of  . GERD (gastroesophageal reflux disease)    "occas"  . Hypertension    sees Dr. Jackalyn Lombard, [place]  . 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 Prostate disease    followed by Dr. Jacqlyn Larsen , s/p biopsy complicated by infection  . Rheumatoid arteritis   . Rosacea    Dr. Sharlett Iles    Surgical History: Past Surgical History:  Procedure Laterality Date  . ACHILLES TENDON SURGERY    . TONSILLECTOMY    . TOTAL KNEE ARTHROPLASTY  12/04/2012   Procedure: TOTAL KNEE ARTHROPLASTY;  Surgeon: Vickey Huger, MD;  Location: [facility];  Service: Orthopedics;   Laterality: Left;  left total knee arthroplasty  . VEIN SURGERY     Left leg, Dr. Wende Neighbors  . wisdom extractions      Home Medications:  Allergies as of 01/24/2017      Reactions   Penicillins Cross Reactors Other (See Comments)   unknown      Medication List       Accurate as of 01/24/17  8:41 AM. Always use your most recent med list.          cholecalciferol 1000 units tablet Commonly known as:  VITAMIN D Take 1,000 Units by mouth daily.   folic acid 1 MG tablet Commonly known as:  FOLVITE TAKE 1 TABLET EVERY DAY   losartan 100 MG tablet Commonly known as:  COZAAR TAKE 1 TABLET EVERY DAY   meloxicam 15 MG tablet Commonly known as:  MOBIC TAKE 1 TABLET BY MOUTH ONCE DAILY WITH FOOD   methotrexate 50 MG/2ML injection INJECT 1ML WEEKLY       Allergies:  Allergies  Allergen Reactions  . Penicillins Cross Reactors Other (See Comments)    unknown    Family History: Family History  Problem Relation Age of Onset  . Cancer Mother     Multiple Myeloma  . Alcohol abuse Father   .  Hematuria Neg Hx   . Prostate cancer Neg Hx   . Sickle cell anemia Neg Hx     Social History:  reports that he has never smoked. He has quit using smokeless tobacco. He reports that he drinks alcohol. He reports that he does not use drugs.  ROS: UROLOGY Frequent Urination?: No Hard to postpone urination?: No Burning/pain with urination?: No Get up at night to urinate?: No Leakage of urine?: No Urine stream starts and stops?: No Trouble starting stream?: No Do you have to strain to urinate?: No Blood in urine?: No Urinary tract infection?: No Sexually transmitted disease?: No Injury to kidneys or bladder?: No Painful intercourse?: No Weak stream?: No Erection problems?: No Penile pain?: No  Gastrointestinal Nausea?: No Vomiting?: No Indigestion/heartburn?: No Diarrhea?: No Constipation?: No  Constitutional Fever: No Night sweats?: No Weight loss?: No Fatigue?:  No  Skin Skin rash/lesions?: No Itching?: No  Eyes Blurred vision?: No Double vision?: No  Ears/Nose/Throat Sore throat?: No Sinus problems?: No  Hematologic/Lymphatic Swollen glands?: No Easy bruising?: No  Cardiovascular Leg swelling?: No Chest pain?: No  Respiratory Cough?: No Shortness of breath?: No  Endocrine Excessive thirst?: No  Musculoskeletal Back pain?: No Joint pain?: No  Neurological Headaches?: No Dizziness?: No  Psychologic Depression?: No Anxiety?: No  Physical Exam: BP 130/75   Pulse 71   Ht _0  (1.778 m)   Wt 93.4 kg (206 lb)   BMI 29.56 kg/m   Constitutional:  Alert and oriented, No acute distress. HEENT: [place] AT, moist mucus membranes.  Trachea midline, no masses. Cardiovascular: No clubbing, cyanosis, or edema. Respiratory: Normal respiratory effort, no increased work of breathing. Skin: No rashes, bruises or suspicious lesions. Neurologic: Grossly intact, no focal deficits, moving all 4 extremities. Psychiatric: Normal mood and affect.  Laboratory Data: Lab Results  Component Value Date   WBC 4.7 12/24/2016   HGB 15.5 12/24/2016   HCT 46 12/24/2016   MCV 97.7 06/17/2016   PLT 259 12/24/2016    Lab Results  Component Value Date   CREATININE 1.2 12/24/2016    Lab Results  Component Value Date   PSA 4.96 (H) 07/28/2016    No results found for: TESTOSTERONE  No results found for: HGBA1C  Urinalysis    Component Value Date/Time   COLORURINE YELLOW 11/29/2012 0845   APPEARANCEUR Cloudy (A) 11/29/2016 1112   LABSPEC 1.030 11/29/2012 0845   PHURINE 5.0 11/29/2012 0845   GLUCOSEU Negative 11/29/2016 1112   HGBUR NEGATIVE 11/29/2012 0845   BILIRUBINUR Negative 11/29/2016 1112   KETONESUR NEGATIVE 11/29/2012 0845   PROTEINUR Negative 11/29/2016 1112   PROTEINUR NEGATIVE 11/29/2012 0845   UROBILINOGEN 0.2 11/29/2012 0845   NITRITE Negative 11/29/2016 1112   NITRITE NEGATIVE 11/29/2012 0845   LEUKOCYTESUR  Negative 11/29/2016 1112    Pertinent Imaging: Prostate MRI  Assessment & Plan:   PSA elevation-I discussed the PSA level rising and the prostate MRI findings. I recommended a prostate biopsy. Given the size of the lesion and that he has not had a biopsy in 8 years and it's reasonable to do a cognitive fusion which will expedite the biopsy in lieu of referring for a formal fusion biopsy. I discussed the difference in the 2 techniques. We discussed the nature risk benefits and alternatives to prostate biopsy. All questions were answered. He elects to proceed with fusion here.   There are no diagnoses linked to this encounter.   No Follow-up on file.   Festus Aloe, MD  [facility] [address] [place], [place] [address] [phone]

## 2017-01-25 ENCOUNTER — Ambulatory Visit (INDEPENDENT_AMBULATORY_CARE_PROVIDER_SITE_OTHER): Payer: PPO | Admitting: Family Medicine

## 2017-01-25 ENCOUNTER — Encounter: Payer: Self-pay | Admitting: Family Medicine

## 2017-01-25 DIAGNOSIS — M069 Rheumatoid arthritis, unspecified: Secondary | ICD-10-CM

## 2017-01-25 DIAGNOSIS — G8929 Other chronic pain: Secondary | ICD-10-CM

## 2017-01-25 DIAGNOSIS — R972 Elevated prostate specific antigen [PSA]: Secondary | ICD-10-CM

## 2017-01-25 DIAGNOSIS — I10 Essential (primary) hypertension: Secondary | ICD-10-CM

## 2017-01-25 DIAGNOSIS — M545 Low back pain, unspecified: Secondary | ICD-10-CM | POA: Insufficient documentation

## 2017-01-25 NOTE — Assessment & Plan Note (Signed)
Chronic issue. Unchanged. Rare numbness in his left medial lower extremity when standing for too long. Resolves with change in position. Neurologically intact today. Discussed monitoring and if any worsening or new symptoms being reevaluated.

## 2017-01-25 NOTE — Assessment & Plan Note (Signed)
At goal. Continue losartan. Recent lab work checked.

## 2017-01-25 NOTE — Assessment & Plan Note (Signed)
Followed by urology. Planning a biopsy.

## 2017-01-25 NOTE — Patient Instructions (Signed)
Nice to see you. Please continue your blood pressure medication. Please watch your back pain and if this worsens please let us know. If you develop numbness, weakness, loss of bowel or bladder function, numbness or tingling in your legs, or any new or changing symptoms please seek medical attention.

## 2017-01-25 NOTE — Progress Notes (Signed)
  Tommi Rumps, MD Phone: [phone]  Julian Pineda is a 71 y.o. male who presents today for f/u.  HYPERTENSION  Disease Monitoring  Home BP Monitoring notes it is good Chest pain- no    Dyspnea- no Medications  Compliance-  Taking losartan.  Edema- no  Patient is followed by urology for elevated PSA. He had an MRI that had several dark spots on his prostate. They are planning on doing a biopsy next month.  Rheumatoid arthritis: Followed by rheumatology. Is currently on methotrexate. Notes this has helped significantly. No pain in his wrists. Does have some nodules in his wrist. Notes when he saw them he was having some mild low back discomfort. This is a chronic issue. No pain now. Rarely gets a numbness in his left lower medial leg close to the foot. Notes he had an MRI previously to evaluate this. He notes no weakness. No saddle anesthesia. No loss of bowel or bladder function.   PMH: nonsmoker.   ROS see history of present illness  Objective  Physical Exam Vitals:   01/25/17 0855  BP: 122/68  Pulse: 72  Temp: 98.5 F (36.9 C)    BP Readings from Last 3 Encounters:  01/25/17 122/68  01/24/17 130/75  11/29/16 (!) 145/76   Wt Readings from Last 3 Encounters:  01/25/17 207 lb (93.9 kg)  01/24/17 206 lb (93.4 kg)  11/29/16 207 lb 4.8 oz (94 kg)    Physical Exam  Constitutional: No distress.  Cardiovascular: Normal rate, regular rhythm and normal heart sounds.   Pulmonary/Chest: Effort normal and breath sounds normal.  Musculoskeletal: He exhibits no edema.  No midline spine tenderness, no midline spine step-off, no muscular back tenderness  Neurological: He is alert. Gait normal.  5 out of 5 strength bilateral quads, hamstrings, plantar flexion, and dorsiflexion, sensation to light touch intact in bilateral lower extremities  Skin: Skin is warm and dry. He is not diaphoretic.     Assessment/Plan: Please see individual problem list.  Hypertension At  goal. Continue losartan. Recent lab work checked.  Rheumatoid arthritis ([place]) Continues to follow with rheumatology. Methotrexate as been beneficial. He will continue this.  Low back pain Chronic issue. Unchanged. Rare numbness in his left medial lower extremity when standing for too long. Resolves with change in position. Neurologically intact today. Discussed monitoring and if any worsening or new symptoms being reevaluated.  Elevated PSA Followed by urology. Planning a biopsy.   Tommi Rumps, MD [facility]

## 2017-01-25 NOTE — Assessment & Plan Note (Signed)
Continues to follow with rheumatology. Methotrexate as been beneficial. He will continue this.

## 2017-02-08 ENCOUNTER — Ambulatory Visit (INDEPENDENT_AMBULATORY_CARE_PROVIDER_SITE_OTHER): Payer: PPO | Admitting: Family Medicine

## 2017-02-08 ENCOUNTER — Encounter: Payer: Self-pay | Admitting: Family Medicine

## 2017-02-08 DIAGNOSIS — H00015 Hordeolum externum left lower eyelid: Secondary | ICD-10-CM | POA: Diagnosis not present

## 2017-02-08 DIAGNOSIS — H00019 Hordeolum externum unspecified eye, unspecified eyelid: Secondary | ICD-10-CM | POA: Insufficient documentation

## 2017-02-08 NOTE — Progress Notes (Signed)
Pre visit review using our clinic review tool, if applicable. No additional management support is needed unless otherwise documented below in the visit note. 

## 2017-02-08 NOTE — Assessment & Plan Note (Signed)
Lesion consistent with stye. It appears to be coming to ahead. Vision checked and normal. Discussed warm compresses. Advised to contact his eye doctor as he does have an appointment in a couple weeks to see if they may be able to move it up and give him some eyedrops versus drain it. Discussed return precautions.

## 2017-02-08 NOTE — Patient Instructions (Signed)
Nice to see you. You have a stye. You should use warm compresses. I would suggest contacting your eye doctor to see if they may be able to move up your eye appointment to consider treatment for this. If you have eye pain, vision changes, spreading redness, swelling, or any new or change in symptoms please seek medical attention immediately.

## 2017-02-08 NOTE — Progress Notes (Signed)
  Tommi Rumps, MD Phone: [phone]  Julian Pineda is a 71 y.o. male who presents today for same-day visit.  Patient notes left lower inner eyelid with a small amount of swelling initially though that has progressively enlarged to some degree and is a nodule that is erythematous. Notes it has been 4 days of getting larger. No pain. No eye pain. No vision changes. No injury to the area. No photophobia. He notes he's been using Visine and warm compresses. Has not drained.    ROS see history of present illness  Objective  Physical Exam Vitals:   02/08/17 0957  BP: 138/78  Pulse: 62  Temp: 98.4 F (36.9 C)    BP Readings from Last 3 Encounters:  02/08/17 138/78  01/25/17 122/68  01/24/17 130/75   Wt Readings from Last 3 Encounters:  02/08/17 207 lb 6.4 oz (94.1 kg)  01/25/17 207 lb (93.9 kg)  01/24/17 206 lb (93.4 kg)    Physical Exam  Constitutional: No distress.  Eyes: Pupils are equal, round, and reactive to light.    Cardiovascular: Normal rate, regular rhythm and normal heart sounds.   Pulmonary/Chest: Effort normal and breath sounds normal.  Musculoskeletal: He exhibits no edema.  Neurological: He is alert. Gait normal.  Skin: He is not diaphoretic.     Assessment/Plan: Please see individual problem list.  Stye Lesion consistent with stye. It appears to be coming to ahead. Vision checked and normal. Discussed warm compresses. Advised to contact his eye doctor as he does have an appointment in a couple weeks to see if they may be able to move it up and give him some eyedrops versus drain it. Discussed return precautions.   Tommi Rumps, MD [facility]

## 2017-02-28 ENCOUNTER — Other Ambulatory Visit: Payer: Self-pay | Admitting: Urology

## 2017-02-28 ENCOUNTER — Ambulatory Visit (INDEPENDENT_AMBULATORY_CARE_PROVIDER_SITE_OTHER): Payer: PPO | Admitting: Urology

## 2017-02-28 ENCOUNTER — Encounter: Payer: Self-pay | Admitting: Urology

## 2017-02-28 VITALS — BP 134/78 | HR 67 | Ht 70.0 in | Wt 207.0 lb

## 2017-02-28 DIAGNOSIS — R972 Elevated prostate specific antigen [PSA]: Secondary | ICD-10-CM

## 2017-02-28 DIAGNOSIS — C61 Malignant neoplasm of prostate: Secondary | ICD-10-CM | POA: Diagnosis not present

## 2017-02-28 MED ORDER — LEVOFLOXACIN 500 MG PO TABS
500.0000 mg | ORAL_TABLET | Freq: Once | ORAL | Status: AC
  Administered 2017-02-28: 500 mg via ORAL

## 2017-02-28 MED ORDER — LIDOCAINE HCL 2 % EX GEL
1.0000 "application " | Freq: Once | CUTANEOUS | Status: AC
  Administered 2017-02-28: 1

## 2017-02-28 NOTE — Progress Notes (Signed)
HPI: Mr Bergeson is a 71 yo f/u for prostate biopsy. He has been well. No fever or dysuria. I reviewed the prostate MRI images again today. I did send a PSA today prior to bx with the caveat the pt had sex yesterday.  He has a hx of prostate biopsy by Dr. Jacqlyn Larsen 8 years ago which was benign. He "passed out" on the way home due to "infection". His brother is 17 mo older and has PCa. He is preparing for XRT and Dr. [name] put in gold seed markers in [place].    PSA trend: 11/2016 PSA 7.1 -- Prostate MRI (01/19/2017) - Prostate 38.6 grams; 6 mm right mid lesion, 2.5 cm right mid to apex lesion -- PiRADs 5; capsule indistinct at the right apex possible ECE, Neg SV, Neg LN, Neg LAD, Neg bone met  08/2016 4.96 -- normal DRE  12/2013 3.7 12/2012 3.1 12/2011 2.8 12/2010 2.0  He has mild LUTS at baseline. He has nocturia 3x and frequency q 3-4 hours. No issues with ED. He is not bothered by his symptoms.   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  Prostate Biopsy Procedure   Informed consent was obtained after discussing risks/benefits of the procedure.  A time out was performed to ensure correct patient identity.  Pre-Procedure: - Last PSA Level:  Lab Results  Component Value Date   PSA 4.96 (H) 07/28/2016   - Gentamicin given prophylactically - Levaquin 500 mg administered PO -DRE: prostate ~ 40 grams, smooth, no obvious hard area or nodule; landmarks preserved  -Transrectal Ultrasound performed revealing a 33.44 gm prostate -No significant hypoechoic or median lobe noted  Procedure: - Prostate block performed using 10 cc 1% lidocaine and biopsies taken from sextant areas, a total of 13 under ultrasound guidance. I added a biopsy at right mid lateral and made sure the lateral biopsies went anterior. This provided about 5 biopsies around the right mid and apex.   Post-Procedure: - Patient tolerated the procedure well - He was counseled to seek immediate medical attention if experiences any  severe pain, significant bleeding, or fevers - Return in one week to discuss biopsy results

## 2017-03-01 LAB — FPSA% REFLEX
% FREE PSA: 12 %
PSA, FREE: 0.84 ng/mL

## 2017-03-01 LAB — PSA TOTAL (REFLEX TO FREE): Prostate Specific Ag, Serum: 7 ng/mL — ABNORMAL HIGH (ref 0.0–4.0)

## 2017-03-04 DIAGNOSIS — H0015 Chalazion left lower eyelid: Secondary | ICD-10-CM | POA: Diagnosis not present

## 2017-03-04 LAB — PATHOLOGY REPORT

## 2017-03-07 ENCOUNTER — Other Ambulatory Visit: Payer: Self-pay | Admitting: Urology

## 2017-03-09 ENCOUNTER — Ambulatory Visit (INDEPENDENT_AMBULATORY_CARE_PROVIDER_SITE_OTHER): Payer: PPO | Admitting: Urology

## 2017-03-09 ENCOUNTER — Encounter: Payer: Self-pay | Admitting: Urology

## 2017-03-09 VITALS — BP 116/72 | HR 75 | Ht 70.0 in | Wt 203.2 lb

## 2017-03-09 DIAGNOSIS — C61 Malignant neoplasm of prostate: Secondary | ICD-10-CM

## 2017-03-09 NOTE — Progress Notes (Signed)
03/09/2017 9:37 AM   Julian Pineda 08/26/1946 [id number]  Referring provider: Leone Haven, MD [address] [place], [place] [address]  Chief Complaint  Patient presents with  . Follow-up    prostate biopsy     HPI: Low Risk Prostate Cancer - Apr 2018 PSA 7.1 T1c Prostate 33 grams Prostate MRI(01/19/2017) - Prostate 38.6 grams; 6 mm right mid lesion, 2.5 cm right mid to apex lesion -- PiRADs 5; capsule indistinct at the right apex possible ECE, Neg SV, Neg LN, Neg LAD, Neg bone met  Biopsy: Gleason 3+3 = 6, left base 2, 20-30% Gleason 3+3 = 6 left mid, 3% Gleason 3+3 = 6, right mid and right apex x 3 cognitive fusion (ROI on MRI), 66-97% 6 cores total positive   He returns and has been well.    PMH: Past Medical History:  Diagnosis Date  . 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 Chronic back pain    hx of  . GERD (gastroesophageal reflux disease)    "occas"  . Hypertension    sees Dr. Jackalyn Lombard, [place]  . 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 Prostate disease    followed by Dr. Jacqlyn Larsen , s/p biopsy complicated by infection  . Rheumatoid arteritis   . Rosacea    Dr. Sharlett Iles    Surgical History: Past Surgical History:  Procedure Laterality Date  . ACHILLES TENDON SURGERY    . TONSILLECTOMY    . TOTAL KNEE ARTHROPLASTY  12/04/2012   Procedure: TOTAL KNEE ARTHROPLASTY;  Surgeon: Vickey Huger, MD;  Location: [facility];  Service: Orthopedics;  Laterality: Left;  left total knee arthroplasty  . VEIN SURGERY     Left leg, Dr. Wende Neighbors  . wisdom extractions      Home Medications:  Allergies as of 03/09/2017      Reactions   Penicillins Cross Reactors Other (See Comments)   unknown      Medication List       Accurate as of 03/09/17  9:37 AM. Always use your most recent med list.          cholecalciferol 1000 units tablet Commonly known as:  VITAMIN D Take 1,000 Units by mouth daily.   folic acid 1 MG  tablet Commonly known as:  FOLVITE TAKE 1 TABLET EVERY DAY   losartan 100 MG tablet Commonly known as:  COZAAR TAKE 1 TABLET EVERY DAY   meloxicam 15 MG tablet Commonly known as:  MOBIC TAKE 1 TABLET BY MOUTH ONCE DAILY WITH FOOD   methotrexate 50 MG/2ML injection INJECT 1ML WEEKLY       Allergies:  Allergies  Allergen Reactions  . Penicillins Cross Reactors Other (See Comments)    unknown    Family History: Family History  Problem Relation Age of Onset  . Cancer Mother     Multiple Myeloma  . Alcohol abuse Father   . Hematuria Neg Hx   . Prostate cancer Neg Hx   . Sickle cell anemia Neg Hx     Social History:  reports that he has never smoked. He has quit using smokeless tobacco. He reports that he drinks alcohol. He reports that he does not use drugs.  ROS: UROLOGY Frequent Urination?: Yes Hard to postpone urination?: No Burning/pain with urination?: No Get up at night to urinate?: No Leakage of urine?: No Urine stream starts and stops?: No Trouble starting stream?: No Do you have to strain  to urinate?: No Blood in urine?: No Urinary tract infection?: No Sexually transmitted disease?: No Injury to kidneys or bladder?: No Painful intercourse?: No Weak stream?: No Erection problems?: No Penile pain?: No  Gastrointestinal Nausea?: No Vomiting?: No Indigestion/heartburn?: No Diarrhea?: No Constipation?: No  Constitutional Fever: No Night sweats?: No Weight loss?: No Fatigue?: No  Skin Skin rash/lesions?: No Itching?: No  Eyes Blurred vision?: No Double vision?: No  Ears/Nose/Throat Sore throat?: No Sinus problems?: No  Hematologic/Lymphatic Swollen glands?: No Easy bruising?: No  Cardiovascular Leg swelling?: No Chest pain?: No  Respiratory Cough?: No Shortness of breath?: No  Endocrine Excessive thirst?: No  Musculoskeletal Back pain?: No Joint pain?: No  Neurological Headaches?: No Dizziness?:  No  Psychologic Depression?: No Anxiety?: No  Physical Exam: BP 116/72   Pulse 75   Ht '5\' 10"'  (1.778 m)   Wt 92.2 kg (203 lb 3.2 oz)   BMI 29.16 kg/m   Constitutional:  Alert and oriented, No acute distress. HEENT: [place] AT, moist mucus membranes.  Trachea midline, no masses. Cardiovascular: No clubbing, cyanosis, or edema. Respiratory: Normal respiratory effort, no increased work of breathing. GI: Abdomen is soft, nontender, nondistended, no abdominal masses GU: No CVA tenderness. Skin: No rashes, bruises or suspicious lesions. Lymph: No cervical or inguinal adenopathy. Neurologic: Grossly intact, no focal deficits, moving all 4 extremities. Psychiatric: Normal mood and affect.  Laboratory Data: Lab Results  Component Value Date   WBC 4.7 12/24/2016   HGB 15.5 12/24/2016   HCT 46 12/24/2016   MCV 97.7 06/17/2016   PLT 259 12/24/2016    Lab Results  Component Value Date   CREATININE 1.2 12/24/2016    Lab Results  Component Value Date   PSA 4.96 (H) 07/28/2016    No results found for: TESTOSTERONE  No results found for: HGBA1C  Urinalysis    Component Value Date/Time   COLORURINE YELLOW 11/29/2012 0845   APPEARANCEUR Cloudy (A) 11/29/2016 1112   LABSPEC 1.030 11/29/2012 0845   PHURINE 5.0 11/29/2012 0845   GLUCOSEU Negative 11/29/2016 1112   HGBUR NEGATIVE 11/29/2012 0845   BILIRUBINUR Negative 11/29/2016 1112   KETONESUR NEGATIVE 11/29/2012 0845   PROTEINUR Negative 11/29/2016 1112   PROTEINUR NEGATIVE 11/29/2012 0845   UROBILINOGEN 0.2 11/29/2012 0845   NITRITE Negative 11/29/2016 1112   NITRITE NEGATIVE 11/29/2012 0845   LEUKOCYTESUR Negative 11/29/2016 1112    Pertinent Imaging: MRI  Assessment & Plan:    Low risk prostate cancer -  Using his path report and 100 questions book I had a long discussion with the patient and his wife. We went his stage, grade and prognosis and the relevant anatomy. We discussed the nature risks and benefits of  active surveillance, radical prostatectomy, IMRT (+/- brachytherapy). We discussed specifically how each treatment might affect the bowel, bladder and sexual function. We discussed how each treatment might effect salvage treatments. We discussed the role of other modalities in the treatment of prostate cancer including chemotherapy, HIFU and cryotherapy. All questions answered.   Patient with low grade prostate cancer but heavy percentages of Gleason 6 disease at the right apex. His exam was benign but  the capsule is distinct in this area on MRI. He may end up needing multimodality therapy such as surgery followed by radiation or external beam plus or minus brachytherapy. His brother is being treated by Dr. [name] and Dr. [name] in [facility] and they requested referral there. A good option would be Prostate Cancer Multidisciplinary Clinic and I'll have  this arranged.  There are no diagnoses linked to this encounter.  No Follow-up on file.  Festus Aloe, [facility] [address], [place], [place] [address] [phone]

## 2017-03-11 ENCOUNTER — Encounter: Payer: Self-pay | Admitting: Radiation Oncology

## 2017-03-11 ENCOUNTER — Telehealth: Payer: Self-pay

## 2017-03-11 NOTE — Telephone Encounter (Signed)
Julian Aloe, MD  Lestine Box, LPN        Will you let patient know the next Multidisciplinary Prostate cancer clinic in [place] is June 8 (to see [name] and Alinda Money same day). Dr Alinda Money said he could see them sooner (maybe May 15), but he'd have to see [facility] separately. What would they like to do? See both separately or wait until June 8th. I think it would be fine if they wait until June 8.    Spoke with pt and wife in reference to prostate cancer clinic and seeing providers. Pt elected to see Dr. Alinda Money on May 15 and then see Dr. [name] later on. Wife inquired about how to get all of this set up. Please advise.

## 2017-03-15 ENCOUNTER — Other Ambulatory Visit: Payer: Self-pay | Admitting: Family Medicine

## 2017-03-16 DIAGNOSIS — N401 Enlarged prostate with lower urinary tract symptoms: Secondary | ICD-10-CM | POA: Diagnosis not present

## 2017-03-16 DIAGNOSIS — C61 Malignant neoplasm of prostate: Secondary | ICD-10-CM | POA: Diagnosis not present

## 2017-03-16 DIAGNOSIS — D400 Neoplasm of uncertain behavior of prostate: Secondary | ICD-10-CM | POA: Diagnosis not present

## 2017-03-17 ENCOUNTER — Encounter: Payer: Self-pay | Admitting: Medical Oncology

## 2017-03-17 NOTE — Progress Notes (Signed)
Called [facility] and faxed a request for pathology slides to be sent to Dr. [name] at [facility] for [facility] [place].

## 2017-03-21 ENCOUNTER — Telehealth: Payer: Self-pay | Admitting: Medical Oncology

## 2017-03-21 NOTE — Progress Notes (Signed)
I called pt to introduce myself as the Prostate Nurse Navigator and the Coordinator of the Prostate [place].  1. I confirmed with the patient he is aware of his referral to the clinic 03/22/17 arriving 12:45-:00 pm.  2. I discussed the format of the clinic and the physicians he will be seeing that day.  3. I discussed where the clinic is located and how to contact me.  4. I explained that I usually mail out a packet of forms but due to scheduling him late, I will bring them to him to fill out in the clinic.   He voiced understanding of the above. I asked him to call me if he has any questions or concerns regarding his appointments or the forms he needs to complete.

## 2017-03-21 NOTE — Progress Notes (Signed)
GU Location of Tumor / Histology: Prostatic adenocarcinoma  If Prostate Cancer, Gleason Score is (3 + 3) and PSA is (7.1) on 02/28/2017.  Capsule indistinct at the right apex possible ECE.    Julian Pineda presented to primary care Dr. Josephina Gip and then referred to Dr. Junious Silk in October of 2017 with signs/symptoms of nocturia x 3, urinary frequency every 3-4 hours.  Had history of prostate biopsy by Dr. Jacqlyn Larsen 8 years ago which was benign.    Biopsies of 02/28/2017 revealed      Past/Anticipated interventions by urology, if any: Transrectal ultrasound and prostate biopsy completed by Dr. Junious Silk on 02/28/2017  Past/Anticipated interventions by medical oncology, if any: No  Weight changes, if any: No  Bowel/Bladder complaints, if any: Urinary frequency q 3-4 hours, nocturia X 3   Nausea/Vomiting, if any: No  Pain issues, if any:  No  SAFETY ISSUES:  Prior radiation? No  Pacemaker/ICD? No  Possible current pregnancy? No  Is the patient on methotrexate? Yes  Current Complaints / other details:  Mr. Julian Pineda is married and his brother who is only 38 mos older than him is also being treated by Dr. [name] for prostate cancer.

## 2017-03-22 ENCOUNTER — Ambulatory Visit
Admission: RE | Admit: 2017-03-22 | Discharge: 2017-03-22 | Disposition: A | Discharge status: Home / Self Care | Payer: PPO | Source: Ambulatory Visit | Attending: Radiation Oncology | Admitting: Radiation Oncology

## 2017-03-22 ENCOUNTER — Encounter: Payer: Self-pay | Admitting: Medical Oncology

## 2017-03-22 ENCOUNTER — Ambulatory Visit (HOSPITAL_BASED_OUTPATIENT_CLINIC_OR_DEPARTMENT_OTHER): Payer: PPO | Admitting: Oncology

## 2017-03-22 VITALS — BP 163/90 | HR 75 | Resp 18 | Ht 70.0 in | Wt 201.8 lb

## 2017-03-22 DIAGNOSIS — C61 Malignant neoplasm of prostate: Secondary | ICD-10-CM | POA: Diagnosis not present

## 2017-03-22 DIAGNOSIS — R972 Elevated prostate specific antigen [PSA]: Secondary | ICD-10-CM | POA: Diagnosis not present

## 2017-03-22 DIAGNOSIS — Z96652 Presence of left artificial knee joint: Secondary | ICD-10-CM | POA: Insufficient documentation

## 2017-03-22 DIAGNOSIS — Z9889 Other specified postprocedural states: Secondary | ICD-10-CM | POA: Diagnosis not present

## 2017-03-22 DIAGNOSIS — L719 Rosacea, unspecified: Secondary | ICD-10-CM | POA: Diagnosis not present

## 2017-03-22 DIAGNOSIS — Z79899 Other long term (current) drug therapy: Secondary | ICD-10-CM | POA: Insufficient documentation

## 2017-03-22 DIAGNOSIS — Z510 Encounter for antineoplastic radiation therapy: Secondary | ICD-10-CM | POA: Diagnosis not present

## 2017-03-22 DIAGNOSIS — Z880 Allergy status to penicillin: Secondary | ICD-10-CM | POA: Insufficient documentation

## 2017-03-22 DIAGNOSIS — K219 Gastro-esophageal reflux disease without esophagitis: Secondary | ICD-10-CM | POA: Diagnosis not present

## 2017-03-22 DIAGNOSIS — I10 Essential (primary) hypertension: Secondary | ICD-10-CM | POA: Diagnosis not present

## 2017-03-22 DIAGNOSIS — M069 Rheumatoid arthritis, unspecified: Secondary | ICD-10-CM | POA: Insufficient documentation

## 2017-03-22 DIAGNOSIS — Z809 Family history of malignant neoplasm, unspecified: Secondary | ICD-10-CM | POA: Diagnosis not present

## 2017-03-22 DIAGNOSIS — G8929 Other chronic pain: Secondary | ICD-10-CM | POA: Insufficient documentation

## 2017-03-22 DIAGNOSIS — Z811 Family history of alcohol abuse and dependence: Secondary | ICD-10-CM | POA: Insufficient documentation

## 2017-03-22 DIAGNOSIS — Z8042 Family history of malignant neoplasm of prostate: Secondary | ICD-10-CM | POA: Diagnosis not present

## 2017-03-22 HISTORY — DX: Malignant neoplasm of prostate: C61

## 2017-03-22 NOTE — Progress Notes (Signed)
Radiation Oncology         [phone] ________________________________  Multidisciplinary Prostate Cancer Clinic  Initial Radiation Oncology Consultation  Name: Julian Pineda MRN: [id number]  Date: 03/22/2017  DOB: [birth year]  VO:[id number], [name], MD  [name], MD   REFERRING PHYSICIAN: [name], MD  DIAGNOSIS: 71 y.o. gentleman with stage T1c adenocarcinoma of the prostate with a Gleason's score of 3+4 and a PSA of 7.0    ICD-9-CM ICD-10-CM   1. Malignant neoplasm of prostate ([place]) [place] ILLNESS::Julian Pineda is a 71 y.o. gentleman.  He was noted to have an elevated PSA of 4.96 by his primary care physician, Dr. Ouida Sills.  Accordingly, he was referred for evaluation in urology by Dr. Junious Silk in 08/2016,  digital rectal examination was performed at that time revealing a symmetrical gland without nodularity or induration.  Repeat PSA was performed on 11/29/16 revealing further elevation at 7.1 and subsequent PSA on 02/28/17 revealed persistent elevation at 7.0.   The patient had a history of elevated PSA with previous prostate biopsy in 2010 which was negative but unfortunately had a post-procedure acute bacterial prostatitis.   He underwent an MRI prostate on 01/19/17 which revealed a 6 mm right mid gland lesion, a 2.5 cm right mid to apex lesion with possible extracapsular extension at the right apex. No evidence of seminal vesicle involvement, lymphovascular invasion, lymphadenopathy or bony metastatic disease. Due to the suspicious appearing nodules on MRI, the patient proceeded to MRI fusion transrectal ultrasound with 12 biopsies of the prostate on 02/28/17. The prostate volume measured 38.6 cc.  Out of 12 core biopsies, 6 were positive. The maximum Gleason score was 3+3, and this was seen in the left base, left base lateral, left mid gland, right mid lateral, right apex and right apex lateral.  On further discussion and review with Dr. [name]  during prostate conference today, it is felt that there are areas on the right side where the Gleason score is likely 3+4 rather than 3+3, in at least one of the samples on the right.  Of note, the patient has a family history of prostate cancer in his brother who is currently under the care of Dr. [name] and Dr. [name] (EBRT with brachytherapy boost).   The patient is currently taking methotrexate for management of his rheumatoid arthritis.  The patient reviewed the biopsy results with his urologist and he has kindly been referred today to the multidisciplinary prostate cancer clinic for presentation of pathology and radiology studies in our conference for discussion of potential radiation treatment options and clinical evaluation.  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    PAST SURGICAL HISTORY: Past Surgical History:  Procedure Laterality Date  . ACHILLES TENDON SURGERY    . COLONOSCOPY  2008  . TONSILLECTOMY    . TOTAL KNEE ARTHROPLASTY  12/04/2012   Procedure: TOTAL KNEE ARTHROPLASTY;  Surgeon: Vickey Huger, MD;  Location: [facility];  Service: Orthopedics;  Laterality: Left;  left total knee arthroplasty  . VEIN SURGERY     Left leg, Dr. Wende Neighbors  . wisdom extractions      FAMILY HISTORY: family history includes Alcohol abuse in his father; Cancer in his brother and mother.  SOCIAL HISTORY:  reports that he has never  smoked. He has quit using smokeless tobacco. He reports that he drinks alcohol. He reports that he does not use drugs.  ALLERGIES: Penicillins cross reactors  MEDICATIONS:  Current Outpatient Prescriptions  Medication Sig Dispense Refill  . cholecalciferol (VITAMIN D) 1000 units tablet Take 1,000 Units by mouth daily.    .  folic acid (FOLVITE) 1 MG tablet TAKE 1 TABLET EVERY DAY 90 tablet 0  . losartan (COZAAR) 100 MG tablet TAKE 1 TABLET EVERY DAY 90 tablet 2  . meloxicam (MOBIC) 15 MG tablet TAKE 1 TABLET BY MOUTH ONCE DAILY WITH FOOD 30 tablet 2  . methotrexate 50 MG/2ML injection INJECT 1ML WEEKLY 4 mL 6   No current facility-administered medications for this encounter.     REVIEW OF SYSTEMS:  On review of systems, the patient reports that he is doing well overall. He denies any chest pain, shortness of breath, cough, fevers, chills, night sweats, unintended weight changes. He denies any bowel disturbances, and denies abdominal pain, nausea or vomiting. He reports a history of skin cancer and arthritis. He denies any new musculoskeletal or joint aches or pains. His IPSS was 5, indicating mild urinary symptoms. He denies erectile dysfunction and is able to complete sexual activity with all attempts. A complete review of systems is obtained and is otherwise negative.  PHYSICAL EXAM:  Wt Readings from Last 3 Encounters:  03/22/17 201 lb 12.8 oz (91.5 kg)  03/09/17 203 lb 3.2 oz (92.2 kg)  02/28/17 207 lb (93.9 kg)   Temp Readings from Last 3 Encounters:  02/08/17 98.4 F (36.9 C) (Oral)  01/25/17 98.5 F (36.9 C) (Oral)  07/09/16 98.5 F (36.9 C) (Oral)   BP Readings from Last 3 Encounters:  03/22/17 (!) 163/90  03/09/17 116/72  02/28/17 134/78   Pulse Readings from Last 3 Encounters:  03/22/17 75  03/09/17 75  02/28/17 67   Pain Assessment Pain Score: 0-No pain/10  In general this is a well appearing Caucasian male in no acute distress. He is alert and oriented x4 and appropriate throughout the examination. HEENT reveals that the patient is normocephalic, atraumatic. EOMs are intact. PERRLA. Skin is intact without any evidence of gross lesions. Cardiovascular exam reveals a regular rate and rhythm, no clicks rubs or murmurs are auscultated. Chest is clear to auscultation bilaterally. Lymphatic  assessment is performed and does not reveal any adenopathy in the cervical, supraclavicular, or axillary chains. Abdomen has active bowel sounds in all quadrants and is intact. The abdomen is soft, non tender, non distended. Lower extremities are negative for pretibial pitting edema, deep calf tenderness, cyanosis or clubbing.  KPS = 100  100 - Normal; no complaints; no evidence of disease. 90   - Able to carry on normal activity; minor signs or symptoms of disease. 80   - Normal activity with effort; some signs or symptoms of disease. 47   - Cares for self; unable to carry on normal activity or to do active work. 60   - Requires occasional assistance, but is able to care for most of his personal needs. 50   - Requires considerable assistance and frequent medical care. 14   - Disabled; requires special care and assistance. 74   - Severely disabled; hospital admission is indicated although death not imminent. 44   - Very sick; hospital admission necessary; active supportive treatment necessary. 10   - Moribund; fatal processes progressing rapidly. 0     - Dead  Karnofsky DA, Abelmann WH, Craver LS and  Burchenal JH [phone]) The use of the nitrogen mustards in the palliative treatment of carcinoma: with particular reference to bronchogenic carcinoma Cancer 1 634-56   LABORATORY DATA:  Lab Results  Component Value Date   WBC 4.7 12/24/2016   HGB 15.5 12/24/2016   HCT 46 12/24/2016   MCV 97.7 06/17/2016   PLT 259 12/24/2016   Lab Results  Component Value Date   NA 142 12/24/2016   K 4.2 12/24/2016   CL 110 06/17/2016   CO2 24 06/17/2016   Lab Results  Component Value Date   ALT 25 12/24/2016   AST 22 12/24/2016   ALKPHOS 47 12/24/2016   BILITOT 1.1 06/17/2016     RADIOGRAPHY: No results found.    IMPRESSION/PLAN: 71 y.o. gentleman with a favorable intermediate risk (FIR), stage T1c adenocarcinoma of the prostate with a PSA of 7.0 and a Gleason score of 3+4.  There is some  concern that his Gleason score is not accurately depicted on his original biopsy and in fact is most likely a 3+4 in at least one of the cores on the right.  We  discussed the patient's workup and outlines the nature of prostate cancer in this setting. The patient's T stage, Gleason's score, and PSA put him into the low-intermediate risk group. Accordingly, he is eligible for a variety of potential treatment options including brachytherapy or 8 weeks of external radiation. We discussed the available radiation techniques, and focused on the details and logistics and delivery. We discussed and outlined the risks, benefits, short and long-term effects associated with radiotherapy and compared and contrasted these with prostatectomy. We did discuss the use of SpaceOAR to reduce rectal toxicity from radiotherapy.  We also discussed the role of ADT in the treatment of intermediate risk prostate cancer and outline the anticipated side effects associated with this therapy.  At the end of the conversation the patient is leaning towards brachytherapy alone.  We will share our discussion with Dr. Junious Silk. He is also scheduled to meet with Dr. Alinda Money today to further discuss the option of prostatectomy.  If the patient ultimately decides on prostate seed implant, we will arrange for CT simulation to assess for possible pubic arch interference of the transperineal needle pathway prior to his scheduled procedure.  He will contact us with his final treatment decision in the near future and we will move forward with scheduling and coordination for this procedure.  He will need to have a prostate MRI following his post-seed CT SIM to confirm appropriate distribution of the [place].  If we do brachytherapy alone, I would not recommend additional ADT given the high dose of brachytherapy.    We spent 60 minutes face to face with the patient and more than 50% of that time was spent in counseling and/or coordination of  care.    Nicholos Johns, PA-C    Tyler Pita, MD  [facility] Direct Dial: [phone]  Fax: [phone] [email or web address]  Skype  [email or web address]  This document serves as a record of services personally performed by Freeman Caldron, PA-C and Tyler Pita, MD. It was created on their behalf by Darcus Austin, a trained medical scribe. The creation of this record is based on the scribe's personal observations and the providers' statements to them. This document has been checked and approved by the attending provider.

## 2017-03-22 NOTE — Progress Notes (Signed)
                               Care Plan Summary  Name: Julian Pineda DOB: [birth year]  Your Medical Team:   Urologist -  Dr. [name], [facility]  Radiation Oncologist - Dr. Tyler Pita, [facility]   Medical Oncologist - Dr. Zola Button, [place]  Recommendations: 1) Robotic Prostatectomy  2) Radiation  3) ? Androgen Deprivation (hormone injection)  * These recommendations are based on information available as of today's consult.      Recommendations may change depending on the results of further tests or exams.  Next Steps: 1) Consider your options and call Cira Rue, RN with your decision    When appointments need to be scheduled, you will be contacted by [facility] and/or [facility].  Questions?  Please do not hesitate to call Cira Rue, RN, BSN, OCN at [phone] any questions or concerns.  Shirlean Mylar is your Oncology Nurse Navigator and is available to assist you while you're receiving your medical care at [facility].

## 2017-03-22 NOTE — Progress Notes (Signed)
Reason for Referral: Prostate cancer.   HPI: 71 year old gentleman currently of [place] where he lived the 28 of his life. He is a gentleman reasonably good health was noted to have a PSA of 7.1. He underwent a prostate biopsy on 02/28/2017 which showed a Gleason score 3+3 = 6 with high-volume disease on the right apex of the prostate. He also had an MRI on 01/19/2017 which showed a 2.5 cm right mid to apex lesion which is suggestive of a higher grade malignancy. Clinically he is really no major symptoms other than occasional nocturia and frequency. He remains reasonably active without any complaints. He denied any abdominal pain or distention. Denied pathological fractures.  He does not report any headaches, blurry vision, syncope or seizures. He does not report any fevers or chills or sweats. He does not report any cough, wheezing or hemoptysis. He does not report any nausea, vomiting or abdominal pain. He does not report any constipation or diarrhea. He does not report any skin rashes or lesions. Remaining review of systems unremarkable.   Past Medical History:  Diagnosis Date  . 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 Chronic back pain    hx of  . GERD (gastroesophageal reflux disease)    "occas"  . Hypertension    sees Dr. Jackalyn Lombard, [place]  . 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 Prostate disease    followed by Dr. Jacqlyn Larsen , s/p biopsy complicated by infection  . Rheumatoid arteritis   . Rosacea    Dr. Sharlett Iles  :  Past Surgical History:  Procedure Laterality Date  . ACHILLES TENDON SURGERY    . TONSILLECTOMY    . TOTAL KNEE ARTHROPLASTY  12/04/2012   Procedure: TOTAL KNEE ARTHROPLASTY;  Surgeon: Vickey Huger, MD;  Location: [place];  Service: Orthopedics;  Laterality: Left;  left total knee arthroplasty  . VEIN SURGERY     Left leg, Dr. Wende Neighbors  . wisdom extractions    :   Current Outpatient Prescriptions:  .  cholecalciferol (VITAMIN  D) 1000 units tablet, Take 1,000 Units by mouth daily., Disp: , Rfl:  .  folic acid (FOLVITE) 1 MG tablet, TAKE 1 TABLET EVERY DAY, Disp: 90 tablet, Rfl: 0 .  losartan (COZAAR) 100 MG tablet, TAKE 1 TABLET EVERY DAY, Disp: 90 tablet, Rfl: 2 .  meloxicam (MOBIC) 15 MG tablet, TAKE 1 TABLET BY MOUTH ONCE DAILY WITH FOOD, Disp: 30 tablet, Rfl: 2 .  methotrexate 50 MG/2ML injection, INJECT 1ML WEEKLY, Disp: 4 mL, Rfl: 6:  Allergies  Allergen Reactions  . Penicillins Cross Reactors Other (See Comments)    unknown  :  Family History  Problem Relation Age of Onset  . Cancer Mother        Multiple Myeloma  . Alcohol abuse Father   . Hematuria Neg Hx   . Prostate cancer Neg Hx   . Sickle cell anemia Neg Hx   :  Social History   Social History  . Marital status: Married    Spouse name: N/A  . Number of children: N/A  . Years of education: N/A   Occupational History  . Not on file.   Social History Main Topics  . Smoking status: Never Smoker  . Smokeless tobacco: Former Systems developer  . Alcohol use Yes     Comment: "social"  . Drug use: No  . Sexual activity: Not on file   Other Topics Concern  . 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 Treadmill 5 days a week   Likes to [employer]  :  Pertinent items are noted in HPI.  Exam: There were no vitals taken for this visit. General appearance: alert and cooperative Throat: lips, mucosa, and tongue normal; teeth and gums normal Neck: no adenopathy Back: negative Resp: clear to auscultation bilaterally Cardio: regular rate and rhythm, S1, S2 normal, no murmur, click, rub or gallop GI: soft, non-tender; bowel sounds normal; no masses,  no organomegaly Skin: Skin color, texture, turgor normal. No rashes or lesions Lymph nodes: Cervical, supraclavicular, and axillary nodes  normal.  CBC    Component Value Date/Time   WBC 4.7 12/24/2016   WBC 10.0 06/17/2016 1552   RBC 4.29 (L) 06/17/2016 1552   HGB 15.5 12/24/2016   HCT 46 12/24/2016   PLT 259 12/24/2016   MCV 97.7 06/17/2016 1552   MCH 33.1 06/17/2016 1552   MCHC 33.9 06/17/2016 1552   RDW 13.8 06/17/2016 1552   LYMPHSABS 1.2 10/17/2015 1426   MONOABS 0.4 10/17/2015 1426   EOSABS 0.3 10/17/2015 1426   BASOSABS 0.1 10/17/2015 1426     Chemistry      Component Value Date/Time   NA 142 12/24/2016   K 4.2 12/24/2016   CL 110 06/17/2016 1908   CO2 24 06/17/2016 1908   BUN 24 (A) 12/24/2016   CREATININE 1.2 12/24/2016   CREATININE 1.74 (H) 06/17/2016 1908   CREATININE 1.03 11/10/2012 1537   GLU 66 12/24/2016      Component Value Date/Time   CALCIUM 9.0 06/17/2016 1908   ALKPHOS 47 12/24/2016   AST 22 12/24/2016   ALT 25 12/24/2016   BILITOT 1.1 06/17/2016 1908      Assessment and Plan:    71 year old with prostate cancer diagnosed in April 2018. He had a Gleason score of 3+3 = 6 with high volume disease noted to right apex. His PSA is 7.1 an MRI confirmed the presence of 2.5 cm mid-apex that could suggest high grade cancer. His case was discussed today the prostate cancer multidisciplinary clinic including radiology review of his MRI and pathology review of his biopsy.  The natural course of this disease was discussed with the patient today, options of therapy were reviewed. Primary surgical therapy versus definitive radiation therapy options are reasonable in his case. If he elects to proceed with radiation therapy short course of androgen deprivation might be also consideration given his high volume disease and put him at a potentially the intermediate risk category. Complications associated with this therapy were reviewed today which include hot flashes, weight gain and sexual dysfunction. He prefers to avoid surgery at this time and radiation may be a reasonable option for him moving  forward.  All his questions were answered today to his satisfaction.

## 2017-03-22 NOTE — Consult Note (Signed)
[facility]     03/22/2017   --------------------------------------------------------------------------------   Julian [facility]a  MRN: [id number]  PRIMARY CARE:  Tommi Rumps, MD  DOB: [birth year], 71 year old Male  REFERRING:    SSN:   PROVIDER:  Festus Aloe, M.D.    TREATING:  [name], M.D.    LOCATION:  [facility] [phone]   --------------------------------------------------------------------------------   CC/HPI: CC: Prostate Cancer   Physician requesting consult: Dr. Eda Keys  PCP: Dr. Tommi Rumps  Location of consult: [facility]   Julian Pineda is a 71 year old gentleman who was noted to have a rising PSA of 7.1 with a negative prior prostate biopsy about 8 years ago by Dr. Jacqlyn Larsen in [place]. This prompted an MRI of the prostate on 01/19/17 that demonstrated a concerning 2.5 x 2.1 cm PI RADS-5 right anterolateral apical lesion. TRUS biopsy of the prostate was performed on 02/28/17 with attention to the area of concern on Julian MRI. 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   Family history: Brother who is undergoing XRT by Dr. [name].   Imaging studies: 01/19/17- MRI - Findings as mentioned above with a question of EPE at the R apex but no LAD or SVI.   PMH: He has a history of GERD, rheumatoid arthritis, and hypertension.  PSH: No abdominal surgeries.   TNM stage: cT1c Nx Mx  PSA: 7.1  Gleason score: 3+3=6 (read as 3+4=7 in one core by Dr. [name])  Biopsy (02/28/17, read by Dr. Elayne Guerin - [facility]): 7/13 cores positive  Left: L mid (3%, 3+3=6), L lateral base (20%, 3+3=6), L base (30%, 3+3=6)  Right: R apex (97%, 3+3=6, PNI), R lateral apex (66^, 3+3=6, PNI), R lateral mid (76%, 3+3=6, 2/2 cores) - Dr. [name] read one  core in the right apex as 3+4=7  Prostate volume: 33.4 cc  PSAD: 0.21   Nomogram  OC disease: 48%  EPE: 52%  SVI: 2%  LNI: 2%  PFS (5 year, 10 year): 90%, 83%   Urinary function: IPSS is 5.  Erectile function: SHIM score is 25.     ALLERGIES: Penicillin    MEDICATIONS: Folic Acid  Losartan Potassium 100 mg tablet  Meloxicam 15 mg tablet  Methotrexate Sodium  Vitamin D3     GU PSH: None   NON-GU PSH: Knee replacement - 2015 Repair Achilles Tendon - 1983    GU PMH: None   NON-GU PMH: GERD Hypertension Rheumatoid Arthritis    FAMILY HISTORY: Prostate Cancer - Brother   SOCIAL HISTORY: Marital Status: Married Current Smoking Status: Patient has never smoked.   Tobacco Use Assessment Completed: Used Tobacco in last 30 days?    REVIEW OF SYSTEMS:    GU Review Male:   Patient denies frequent urination, hard to postpone urination, burning/ pain with urination, get up at night to urinate, leakage of urine, stream starts and stops, trouble starting your streams, and have to strain to urinate .  Gastrointestinal (Lower):   Patient denies diarrhea and constipation.  Gastrointestinal (Upper):   Patient denies nausea and vomiting.  Constitutional:   Patient denies fever, night sweats, weight loss, and fatigue.  Skin:   Patient denies skin rash/ lesion and itching.  Eyes:   Patient denies blurred vision and double vision.  Ears/ Nose/ Throat:  Patient denies sore throat and sinus problems.  Hematologic/Lymphatic:   Patient denies swollen glands and easy bruising.  Cardiovascular:   Patient denies leg swelling and chest pains.  Respiratory:   Patient denies cough and shortness of breath.  Endocrine:   Patient denies excessive thirst.  Musculoskeletal:   Patient denies back pain and joint pain.  Neurological:   Patient denies headaches and dizziness.  Psychologic:   Patient denies depression and anxiety.   VITAL SIGNS: None   GU PHYSICAL EXAMINATION:    Prostate:  Prostate about 40 grams. He is noted to have nodularity extending along the lateral aspect of the left apex. This is on the opposite side of Julian MRI finding and may possibly be related to post biopsy change.   MULTI-SYSTEM PHYSICAL EXAMINATION:    Constitutional: Well-nourished. No physical deformities. Normally developed. Good grooming.  Neck: Neck symmetrical, not swollen. Normal tracheal position.  Respiratory: No labored breathing, no use of accessory muscles. Clear bilaterally.  Cardiovascular: Normal temperature, normal extremity pulses, no swelling, no varicosities. Regular rate and rhythm.  Lymphatic: No enlargement of neck, axillae, groin.  Skin: No paleness, no jaundice, no cyanosis. No lesion, no ulcer, no rash.  Neurologic / Psychiatric: Oriented to time, oriented to place, oriented to person. No depression, no anxiety, no agitation.  Gastrointestinal: No mass, no tenderness, no rigidity, non obese abdomen.  Eyes: Normal conjunctivae. Normal eyelids.  Ears, Nose, Mouth, and Throat: Left ear no scars, no lesions, no masses. Right ear no scars, no lesions, no masses. Nose no scars, no lesions, no masses. Normal hearing. Normal lips.  Musculoskeletal: Normal gait and station of head and neck.     PAST DATA REVIEWED:  Source Of History:  Patient  Lab Test Review:   PSA  Records Review:   Pathology Reports, Previous Patient Records  X-Ray Review: MRI Prostate GSORAD: Reviewed Films.     PROCEDURES: None   ASSESSMENT:      ICD-10 Details  1 GU:   Prostate Cancer - C61    PLAN:           Document Letter(s):  Created for Patient: Clinical Summary         Notes:   1. Prostate cancer: I had a detailed discussion with Julian Pineda and Julian Pineda regarding Julian prostate cancer diagnosis. Based on Julian life expectancy and disease parameters, he was recommended to undergo therapy of curative intent. Specifically, we discussed Julian MRI findings and pathology read per Dr. [name] which suggest  that he may have at least low intermediate risk disease and relatively high volume disease.   The patient was counseled about the natural history of prostate cancer and the standard treatment options that are available for prostate cancer. It was explained to him how Julian age and life expectancy, clinical stage, Gleason score, and PSA affect Julian prognosis, the decision to proceed with additional staging studies, as well as how that information influences recommended treatment strategies. We discussed the roles for active surveillance, radiation therapy, surgical therapy, androgen deprivation, as well as ablative therapy options for the treatment of prostate cancer as appropriate to Julian individual cancer situation. We discussed the risks and benefits of these options with regard to their impact on cancer control and also in terms of potential adverse events, complications, and impact on quality of life particularly related to urinary and sexual function. The patient was encouraged to ask questions throughout the discussion today and all questions were answered to Julian stated satisfaction. In  addition, the patient was provided with and/or directed to appropriate resources and literature for further education about prostate cancer and treatment options.   We discussed surgical therapy for prostate cancer including the different available surgical approaches. We discussed, in detail, the risks and expectations of surgery with regard to cancer control, urinary control, and erectile function as well as the expected postoperative recovery process. Additional risks of surgery including but not limited to bleeding, infection, hernia formation, nerve damage, lymphocele formation, bowel/rectal injury potentially necessitating colostomy, damage to the urinary tract resulting in urine leakage, urethral stricture, and the cardiopulmonary risks such as myocardial infarction, stroke, death, venothromboembolism, etc. were  explained. The risk of open surgical conversion for robotic/laparoscopic prostatectomy was also discussed.   He is scheduled to see both Dr. Alen Blew and Dr. [name] later today. He is going to consider surgical therapy and primary radiation therapy. If he does proceed with surgical therapy, he understands that my recommendation would be to proceed with a left nerve sparing and partial right nerve sparing prostatectomy and BPLND based on Julian MRI and concern for possible extraprostatic extension. I will also likely reexamine Julian prostate at the time of surgery considering the abnormality noted at Julian apex today. He will notify me if I can be of further assistance to him.   Dr. Festus Aloe  Dr. Tommi Rumps  Dr. Tyler Pita  Dr. Zola Button

## 2017-03-23 ENCOUNTER — Ambulatory Visit: Payer: PPO | Admitting: Radiation Oncology

## 2017-03-23 ENCOUNTER — Encounter: Payer: Self-pay | Admitting: General Practice

## 2017-03-23 ENCOUNTER — Encounter: Payer: Self-pay | Admitting: Radiation Oncology

## 2017-03-23 ENCOUNTER — Ambulatory Visit: Payer: PPO

## 2017-03-23 NOTE — Progress Notes (Signed)
[facility] Psychosocial Distress Screening Spiritual Care  Shadowed by Selena Batten Cash/LPCA, [place], met with Julian P[facility]a" and his wife Julian Pineda in [facility] to introduce [facility] team/resources, reviewing distress screen per protocol.  The patient scored a [unspecified] on the Psychosocial Distress Thermometer which indicates [unspecified]                        distress. Also assessed for distress and other psychosocial needs.   ONCBCN DISTRESS SCREENING 03/23/2017  Screening Type Initial Screening  Physical Problem type Changes in urination  Referral to support programs Yes   Per pt, he enjoys golfing (esp in hot weather) as a form of recreation, fitness, and social contact. He also enjoys lifting weights and stretching to keep fit. Per spouse, she enjoys walking as exercise, meaning-making, and head-clearing. They are [place] Me[place] and take comfort in their faith. Per pt, his brother has been treated here for prostate cancer, so he has good support. Couple's daughter received tx for breast cancer here, so setting is familiar.  Normalized feelings and introduced Support Center team/resources.  Follow up needed: No. Couple has full packet of [facility] team/programming information and knows to call with any needs/questions. Please also page if immediate needs arise. Thank you.   [place], [place], [employer] Pager [phone] Voicemail [phone]

## 2017-03-31 ENCOUNTER — Other Ambulatory Visit: Payer: Self-pay | Admitting: Radiation Oncology

## 2017-03-31 ENCOUNTER — Encounter: Payer: Self-pay | Admitting: Medical Oncology

## 2017-03-31 ENCOUNTER — Telehealth: Payer: Self-pay | Admitting: Medical Oncology

## 2017-03-31 NOTE — Progress Notes (Signed)
Returned a called to Julian Pineda and spoke with wife Julian Pineda. She states that he has decided on brachytherapy for treatment of his prostate cancer. I informed her that [name]- Dr. [name] will contact him with appointment times and instructions. I explained that this has to be coordinated with his urologist, outpatient surgery and Dr. [name] so it may be a few days before he gets a call. She voiced understanding.

## 2017-03-31 NOTE — Progress Notes (Signed)
Emailed Dr. [name], Ailene Ards and [name] to notify them that patient has chosen brachytherapy for treatment of his prostate cancer.

## 2017-04-01 ENCOUNTER — Telehealth: Payer: Self-pay | Admitting: *Deleted

## 2017-04-01 NOTE — Telephone Encounter (Signed)
Called patient to ask some questions, lvm for a return call

## 2017-04-04 ENCOUNTER — Telehealth: Payer: Self-pay | Admitting: *Deleted

## 2017-04-04 ENCOUNTER — Other Ambulatory Visit: Payer: Self-pay | Admitting: Urology

## 2017-04-04 NOTE — Telephone Encounter (Signed)
CALLED PATIENT TO INFORM OF PRE-SEED APPT. ON 04-29-17 @ 2 PM, SPOKE WITH PATIENT AND HE IS AWARE OF THIS APPT.

## 2017-04-06 ENCOUNTER — Telehealth: Payer: Self-pay | Admitting: Radiation Oncology

## 2017-04-06 NOTE — Telephone Encounter (Signed)
Phoned patient's cell. No answer. Phoned patient's home. No answer. Left message on home voicemail clarify that methotrexate would need to be stopped for 6-8 weeks following radiation seed implant. Left my contact information for any additional questions.

## 2017-04-06 NOTE — Telephone Encounter (Signed)
-----   Message from Freeman Caldron, [place] sent at 04/05/2017  5:15 PM EDT ----- Regarding: RE: methotrexate No.  I'm sorry I was not more clear.  He will only need to discontinue use during the 6-8 weeks following his implant.  I only recommended that he discuss with his rheumatologist ahead of time so that he has a plan in place for alternative maintenance therapy if necessary.  Ailene Ards ----- Message ----- From: Heywood Footman, RN Sent: 04/05/2017   2:40 PM To: Freeman Caldron, PA-C Subject: FW: methotrexate                               Ashlyn.  They are asking you to be more specific on time frame. He understands that the plan of care is seeds only. Do you want him to stop methotrexate now and for eight weeks or for eight weeks after his seed implant.   Sam   ----- Message ----- From: Freeman Caldron, PA-C Sent: 04/01/2017  12:09 PM To: Heywood Footman, RN Subject: methotrexate                                   Sam, Will you contact this patient to inform him of the need to discontinue use of Methotrexate injections for 6-8 weeks during treatment, per Dr. [name].  It would be worthwhile for him to contact his rheumatologist to discuss alternative therapy during that 6-8 week period to avoid a severe RA flare.  He is being scheduled for brachytherapy for treatment of his prostate cancer so we want to avoid any potential complications associated with methotrexate in combination with radiation. Thank you! -Ashlyn

## 2017-04-26 ENCOUNTER — Ambulatory Visit: Payer: PPO

## 2017-04-28 ENCOUNTER — Telehealth: Payer: Self-pay | Admitting: *Deleted

## 2017-04-28 NOTE — Telephone Encounter (Signed)
Called patient to remind of appts. for 04-29-17, spoke with patient's wife-Janice and she is aware of these appts.

## 2017-04-29 ENCOUNTER — Ambulatory Visit
Admission: RE | Admit: 2017-04-29 | Discharge: 2017-04-29 | Disposition: A | Discharge status: Home / Self Care | Payer: PPO | Source: Ambulatory Visit | Attending: Radiation Oncology | Admitting: Radiation Oncology

## 2017-04-29 ENCOUNTER — Ambulatory Visit (HOSPITAL_COMMUNITY)
Admission: RE | Admit: 2017-04-29 | Discharge: 2017-04-29 | Disposition: A | Discharge status: Home / Self Care | Payer: PPO | Source: Ambulatory Visit | Attending: Urology | Admitting: Urology

## 2017-04-29 ENCOUNTER — Encounter: Payer: Self-pay | Admitting: Medical Oncology

## 2017-04-29 DIAGNOSIS — C61 Malignant neoplasm of prostate: Secondary | ICD-10-CM

## 2017-04-29 DIAGNOSIS — Z510 Encounter for antineoplastic radiation therapy: Secondary | ICD-10-CM | POA: Diagnosis not present

## 2017-04-29 DIAGNOSIS — J9811 Atelectasis: Secondary | ICD-10-CM | POA: Diagnosis not present

## 2017-04-29 NOTE — Progress Notes (Signed)
  Radiation Oncology         [phone] ________________________________  Name: Julian [facility]a MRN: [id number]  Date: 04/29/2017  DOB: [birth year]  SIMULATION AND TREATMENT PLANNING NOTE PUBIC ARCH STUDY  [id number], [name], MD  [name], MD  DIAGNOSIS: 71 y.o. gentleman with stage T1c adenocarcinoma of the prostate with a Gleason's score of 3+4 and a PSA of 7.0     ICD-10-CM   1. Malignant neoplasm of prostate ([place]) C61     COMPLEX SIMULATION:  The patient presented today for evaluation for possible prostate seed implant. He was brought to the radiation planning suite and placed supine on the CT couch. A 3-dimensional image study set was obtained in upload to the planning computer. There, on each axial slice, I contoured the prostate gland. Then, using three-dimensional radiation planning tools I reconstructed the prostate in view of the structures from the transperineal needle pathway to assess for possible pubic arch interference. In doing so, I did not appreciate any pubic arch interference. Also, the patient's prostate volume was estimated based on the drawn structure. The volume was 39 cc.  Given the pubic arch appearance and prostate volume, patient remains a good candidate to proceed with prostate seed implant. Today, he freely provided informed written consent to proceed.    PLAN: The patient will undergo prostate seed implant.   ________________________________  Sheral Apley. [name], M.D.

## 2017-04-29 NOTE — Progress Notes (Signed)
Mr. Hetz here for CT simulation pre-seed implant. He is doing well and ready to move forward with treatment. Seed implant scheduled for 06/17/17.

## 2017-05-09 DIAGNOSIS — C4401 Basal cell carcinoma of skin of lip: Secondary | ICD-10-CM | POA: Diagnosis not present

## 2017-05-09 DIAGNOSIS — Z85828 Personal history of other malignant neoplasm of skin: Secondary | ICD-10-CM | POA: Diagnosis not present

## 2017-05-09 DIAGNOSIS — L118 Other specified acantholytic disorders: Secondary | ICD-10-CM | POA: Diagnosis not present

## 2017-05-09 DIAGNOSIS — D485 Neoplasm of uncertain behavior of skin: Secondary | ICD-10-CM | POA: Diagnosis not present

## 2017-05-09 DIAGNOSIS — D045 Carcinoma in situ of skin of trunk: Secondary | ICD-10-CM | POA: Diagnosis not present

## 2017-05-09 DIAGNOSIS — X32XXXA Exposure to sunlight, initial encounter: Secondary | ICD-10-CM | POA: Diagnosis not present

## 2017-05-09 DIAGNOSIS — L570 Actinic keratosis: Secondary | ICD-10-CM | POA: Diagnosis not present

## 2017-05-09 DIAGNOSIS — D0439 Carcinoma in situ of skin of other parts of face: Secondary | ICD-10-CM | POA: Diagnosis not present

## 2017-05-24 DIAGNOSIS — Z85828 Personal history of other malignant neoplasm of skin: Secondary | ICD-10-CM | POA: Diagnosis not present

## 2017-05-24 DIAGNOSIS — C44119 Basal cell carcinoma of skin of left eyelid, including canthus: Secondary | ICD-10-CM | POA: Diagnosis not present

## 2017-05-27 ENCOUNTER — Telehealth: Payer: Self-pay | Admitting: Urology

## 2017-05-27 NOTE — Telephone Encounter (Signed)
Patient called and cancelled his app with [place] on 06-01-17 because he is having brachytherapy with Dr. [name] in [place] on 06-17-17. He will call and reschd if needed.   Sharyn Lull

## 2017-05-31 NOTE — Progress Notes (Signed)
06/01/2017 11:23 PM   Julian Pineda Aug 03, 1946 [id number]  Referring provider: Leone Haven, MD [address] [place], [place] [address]  Chief Complaint  Patient presents with  . Pre-op Exam    Surgery with Dr. Junious Pineda on the 17th patient states Julian Pineda    HPI: Patient is a 71 year old Caucasian male with prostate cancer who presents today to prepare for prostate seed implantation.    Background history  Low Risk Prostate Cancer - Apr 2018 PSA 7.1 T1c Prostate 33 grams Prostate MRI(01/19/2017) - Prostate 38.6 grams; 6 mm right mid lesion, 2.5 cm right mid to apex lesion -- PiRADs 5; capsule indistinct at the right apex possible ECE, Neg SV, Neg LN, Neg LAD, Neg bone met  Biopsy: Gleason 3+3 = 6, left base 2, 20-30% Gleason 3+3 = 6 left mid, 3% Gleason 3+3 = 6, right mid and right apex x 3 cognitive fusion (ROI on MRI), 66-97% 6 cores total positive   BPH WITH LUTS  (prostate and/or bladder) His IPSS score today is 6, which is mild lower urinary tract symptomatology. He is mostly satisfied with his quality life due to his urinary symptoms.   His major complaints today are frequency and nocturia.   He has had these symptoms for a few years.  He denies any dysuria, hematuria or suprapubic pain.   He also denies any recent fevers, chills, nausea or vomiting. His brother has been diagnosed with prostate cancer.       IPSS    Row Name 06/01/17 1300         International Prostate Symptom Score   How often have you had the sensation of not emptying your bladder? Less than 1 in 5     How often have you had to urinate less than every two hours? About half the time     How often have you found you stopped and started again several times when you urinated? Not at All     How often have you found it difficult to postpone urination? Not at All     How often have you had a weak urinary stream? Not at All     How often have you had to strain to start urination? Not  at All     How many times did you typically get up at night to urinate? 2 Times     Total IPSS Score 6       Quality of Life due to urinary symptoms   If you were to spend the rest of your life with your urinary condition just the way it is now how would you feel about that? Mostly Satisfied        Score:  1-7 Mild 8-19 Moderate 20-35 Severe   Erectile dysfunction His SHIM score is 25, which is no ED.   His libido is preserved.   His risk factors for ED are age, pelvic radiation, BPH, prostate cancer and HTN, He denies any painful erections or curvatures with his erections.   He is still having/no longer having spontaneous erections.      SHIM    Row Name 06/01/17 1340         SHIM: Over the last 6 months:   How do you rate your confidence that you could get and keep an erection? Very High     When you had erections with sexual stimulation, how often were your erections hard enough for penetration (entering your partner)? Almost  Always or Always     During sexual intercourse, how often were you able to maintain your erection after you had penetrated (entered) your partner? Almost Always or Always     During sexual intercourse, how difficult was it to maintain your erection to completion of intercourse? Not Difficult     When you attempted sexual intercourse, how often was it satisfactory for you? Almost Always or Always       SHIM Total Score   SHIM 25        Score: 1-7 Severe ED 8-11 Moderate ED 12-16 Mild-Moderate ED 17-21 Mild ED 22-25 No ED  PMH: Past Medical History:  Diagnosis Date  . Cancer (HCC)    Squamous cell face, MOHS, Dr. Lacinda Pineda  . Chronic back pain    hx of  . GERD (gastroesophageal reflux disease)    "occas"  . Hypertension    sees Dr. Jackalyn Pineda, [place]  . 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 Prostate cancer ([place])   . Prostate disease    followed by Dr. Jacqlyn Pineda , s/p biopsy complicated by infection  . Rheumatoid arteritis   .  Rosacea    Dr. Sharlett Pineda    Surgical History: Past Surgical History:  Procedure Laterality Date  . ACHILLES TENDON SURGERY    . COLONOSCOPY  2008  . TONSILLECTOMY    . TOTAL KNEE ARTHROPLASTY  12/04/2012   Procedure: TOTAL KNEE ARTHROPLASTY;  Surgeon: Julian Huger, MD;  Location: [facility];  Service: Orthopedics;  Laterality: Left;  left total knee arthroplasty  . VEIN SURGERY     Left leg, Dr. Wende Pineda  . wisdom extractions      Home Medications:  Allergies as of 06/01/2017      Reactions   Penicillins Cross Reactors Other (See Comments)   unknown      Medication List       Accurate as of 06/01/17 11:23 PM. Always use your most recent med list.          cholecalciferol 1000 units tablet Commonly known as:  VITAMIN D Take 1,000 Units by mouth daily.   folic acid 1 MG tablet Commonly known as:  FOLVITE TAKE 1 TABLET EVERY DAY   losartan 100 MG tablet Commonly known as:  COZAAR TAKE 1 TABLET EVERY DAY   meloxicam 15 MG tablet Commonly known as:  MOBIC TAKE 1 TABLET BY MOUTH ONCE DAILY WITH FOOD   methotrexate 50 MG/2ML injection INJECT 1ML WEEKLY       Allergies:  Allergies  Allergen Reactions  . Penicillins Cross Reactors Other (See Comments)    unknown    Family History: Family History  Problem Relation Age of Onset  . Cancer Mother        Multiple Myeloma  . Alcohol abuse Father   . Prostate cancer Brother   . Hematuria Neg Hx   . Sickle cell anemia Neg Hx   . Kidney cancer Neg Hx   . Bladder Cancer Neg Hx     Social History:  reports that he has never smoked. He has quit using smokeless tobacco. He reports that he drinks alcohol. He reports that he does not use drugs.  ROS: UROLOGY Frequent Urination?: No Hard to postpone urination?: No Burning/pain with urination?: No Get up at night to urinate?: No Leakage of urine?: No Urine stream starts and stops?: No Trouble starting stream?: No Do you have to strain to urinate?: No Blood in urine?:  No Urinary tract infection?: No  Sexually transmitted disease?: No Injury to kidneys or bladder?: No Painful intercourse?: No Weak stream?: No Erection problems?: No Penile pain?: No  Gastrointestinal Nausea?: No Vomiting?: No Indigestion/heartburn?: No Diarrhea?: No Constipation?: No  Constitutional Fever: No Night sweats?: No Weight loss?: No Fatigue?: No  Skin Skin rash/lesions?: No Itching?: No  Eyes Blurred vision?: No Double vision?: No  Ears/Nose/Throat Sore throat?: No Sinus problems?: No  Hematologic/Lymphatic Swollen glands?: No Easy bruising?: No  Cardiovascular Leg swelling?: No Chest pain?: No  Respiratory Cough?: No Shortness of breath?: No  Endocrine Excessive thirst?: No  Musculoskeletal Back pain?: No Joint pain?: No  Neurological Headaches?: No Dizziness?: No  Psychologic Depression?: No Anxiety?: No  Physical Exam: BP (!) 150/91   Pulse 72   Ht '5\' 10"'$  (1.778 m)   Wt 202 lb 8 oz (91.9 kg)   BMI 29.06 kg/m   Constitutional:  Alert and oriented, No acute distress. HEENT: [place] AT, moist mucus membranes.  Trachea midline, no masses. Cardiovascular: No clubbing, cyanosis, or edema.  S1, S2 without murmurs, rubs or gallops Respiratory: Normal respiratory effort, no increased work of breathing.  CTAB GI: Abdomen is soft, nontender, nondistended, no abdominal masses GU: No CVA tenderness. Skin: No rashes, bruises or suspicious lesions. Lymph: No cervical or inguinal adenopathy. Neurologic: Grossly intact, no focal deficits, moving all 4 extremities. Psychiatric: Normal mood and affect.  Laboratory Data: Lab Results  Component Value Date   WBC 4.7 12/24/2016   HGB 15.5 12/24/2016   HCT 46 12/24/2016   MCV 97.7 06/17/2016   PLT 259 12/24/2016    Lab Results  Component Value Date   CREATININE 1.2 12/24/2016    Lab Results  Component Value Date   PSA 4.96 (H) 07/28/2016    Urinalysis Unremarkable.  See EPIC. I  have reviewed the labs  Pertinent Imaging: CLINICAL DATA:  Elevated PSA levels.  EXAM: MR PROSTATE WITHOUT AND WITH CONTRAST  TECHNIQUE: Multiplanar multisequence MRI images were obtained of the pelvis centered about the prostate. Pre and post contrast images were obtained.  CONTRAST:  61m MULTIHANCE GADOBENATE DIMEGLUMINE 529 MG/ML IV SOLN  COMPARISON:  None.  FINDINGS: Prostate: Prostate volume 38.6 cc.  In the right lateral and anterior peripheral zone at the apex, there is a 1.48 CC lesion measuring 2.5 by 1.2 cm in plane, with low T2 and low ADC map activity, early enhancement and drop off of enhancement. PI-RADS category 5, very high suspicion for prostate cancer. Some images suggest the possibility of faint localize trans capsular spread.  Volume: 38.6 cc  Transcapsular spread: Absent, although part of the capsular border along the right apical lesion is indistinct.  Seminal vesicle involvement: Absent  Neurovascular bundle involvement: Absent  Pelvic adenopathy: Absent  Bone metastasis: Absent  Other findings: Sigmoid colon diverticulosis.  IMPRESSION: 1. Right lateral and anterior peripheral zone apical lesion, 1.5 cubic cm, PI-RADS category 5, high suspicion for prostate cancer. No definite trans capsular spread. No adenopathy or bony lesions observed. Prostate gland volume 38.6 cc. 2. Sigmoid colon diverticulosis   Electronically Signed   By: WVan ClinesM.D.   On: 01/19/2017 14:43  I have independently reviewed the fims  Assessment & Plan:    Low risk prostate cancer -  Using his path report and 100 questions book Dr. EJunious Silkhad a long discussion with the patient and his wife. We went his stage, grade and prognosis and the relevant anatomy. We discussed the nature risks and benefits of active surveillance, radical prostatectomy,  IMRT (+/- brachytherapy). We discussed specifically how each treatment might affect the bowel,  bladder and sexual function. We discussed how each treatment might effect salvage treatments. We discussed the role of other modalities in the treatment of prostate cancer including chemotherapy, HIFU and cryotherapy. All questions answered.   Patient with low grade prostate cancer but heavy percentages of Gleason 6 disease at the right apex. His exam was benign but  the capsule is distinct in this area on MRI. He may end up needing multimodality therapy such as surgery followed by radiation or external beam plus or minus brachytherapy. His brother is being treated by Dr. [name] and Dr. [name] in [facility] and they requested referral there. A good option would be Pro[facility] Cancer [facility] and I'll have this arranged.  He has decided to undergo prostate seed implant.  His questions are answered and he is ready to proceed.  He will discontinue his Mobic ten days prior to the seed implant.  He will discontinue methotrexate during the 6 to 8 weeks following his implant.  He has been in touch with his rheumatologist and they are aware of this issue.      Return for seed implant.  Zara Council, [facility] [address] [place], [place] [address] [phone]

## 2017-06-01 ENCOUNTER — Ambulatory Visit (INDEPENDENT_AMBULATORY_CARE_PROVIDER_SITE_OTHER): Payer: PPO | Admitting: Urology

## 2017-06-01 ENCOUNTER — Ambulatory Visit: Payer: PPO

## 2017-06-01 ENCOUNTER — Encounter: Payer: Self-pay | Admitting: Urology

## 2017-06-01 VITALS — BP 150/91 | HR 72 | Ht 70.0 in | Wt 202.5 lb

## 2017-06-01 DIAGNOSIS — Z01818 Encounter for other preprocedural examination: Secondary | ICD-10-CM

## 2017-06-01 DIAGNOSIS — C61 Malignant neoplasm of prostate: Secondary | ICD-10-CM

## 2017-06-01 LAB — MICROSCOPIC EXAMINATION
BACTERIA UA: NONE SEEN
RBC, UA: NONE SEEN /hpf (ref 0–?)
WBC, UA: NONE SEEN /hpf (ref 0–?)

## 2017-06-01 LAB — URINALYSIS, COMPLETE
BILIRUBIN UA: NEGATIVE
Glucose, UA: NEGATIVE
Ketones, UA: NEGATIVE
Leukocytes, UA: NEGATIVE
NITRITE UA: NEGATIVE
PH UA: 6 (ref 5.0–7.5)
Protein, UA: NEGATIVE
RBC, UA: NEGATIVE
Specific Gravity, UA: 1.02 (ref 1.005–1.030)
Urobilinogen, Ur: 0.2 mg/dL (ref 0.2–1.0)

## 2017-06-03 ENCOUNTER — Telehealth: Payer: Self-pay | Admitting: *Deleted

## 2017-06-03 NOTE — Telephone Encounter (Signed)
CALLED PATIENT TO REMIND OF LABS FOR IMPLANT ON 06-10-17, SPOKE WITH PATIENT AND HE IS AWARE OF THIS APPT., HE WILL ARRIVE @ [place], PT. VERIFIED UNDERSTANDING THIS

## 2017-06-05 LAB — CULTURE, URINE COMPREHENSIVE

## 2017-06-06 ENCOUNTER — Encounter (HOSPITAL_BASED_OUTPATIENT_CLINIC_OR_DEPARTMENT_OTHER): Payer: Self-pay | Admitting: *Deleted

## 2017-06-06 NOTE — Progress Notes (Signed)
NPO AFTER MN.  ARRIVE AT 0600.  GETTING LAB WORK DONE Friday 06-10-2017 (CBC, CMET, PT/INR, PTT).  CURRENT EKG AND CXR IN CHART AND EPIC.  WILL TAKE LOSARTAN AM DOS W/ SIPS OF WATER AND DO FLEET ENEMA.

## 2017-06-10 DIAGNOSIS — Z880 Allergy status to penicillin: Secondary | ICD-10-CM | POA: Diagnosis not present

## 2017-06-10 DIAGNOSIS — K219 Gastro-esophageal reflux disease without esophagitis: Secondary | ICD-10-CM | POA: Diagnosis not present

## 2017-06-10 DIAGNOSIS — Z79899 Other long term (current) drug therapy: Secondary | ICD-10-CM | POA: Diagnosis not present

## 2017-06-10 DIAGNOSIS — Z85828 Personal history of other malignant neoplasm of skin: Secondary | ICD-10-CM | POA: Diagnosis not present

## 2017-06-10 DIAGNOSIS — M069 Rheumatoid arthritis, unspecified: Secondary | ICD-10-CM | POA: Diagnosis not present

## 2017-06-10 DIAGNOSIS — L719 Rosacea, unspecified: Secondary | ICD-10-CM | POA: Diagnosis not present

## 2017-06-10 DIAGNOSIS — E559 Vitamin D deficiency, unspecified: Secondary | ICD-10-CM | POA: Diagnosis not present

## 2017-06-10 DIAGNOSIS — Z87891 Personal history of nicotine dependence: Secondary | ICD-10-CM | POA: Diagnosis not present

## 2017-06-10 DIAGNOSIS — I10 Essential (primary) hypertension: Secondary | ICD-10-CM | POA: Diagnosis not present

## 2017-06-10 DIAGNOSIS — C61 Malignant neoplasm of prostate: Secondary | ICD-10-CM | POA: Diagnosis not present

## 2017-06-10 LAB — CBC
HCT: 43.2 % (ref 39.0–52.0)
Hemoglobin: 15 g/dL (ref 13.0–17.0)
MCH: 33.6 pg (ref 26.0–34.0)
MCHC: 34.7 g/dL (ref 30.0–36.0)
MCV: 96.6 fL (ref 78.0–100.0)
Platelets: 222 10*3/uL (ref 150–400)
RBC: 4.47 MIL/uL (ref 4.22–5.81)
RDW: 13 % (ref 11.5–15.5)
WBC: 5.6 10*3/uL (ref 4.0–10.5)

## 2017-06-10 LAB — COMPREHENSIVE METABOLIC PANEL
ALBUMIN: 4 g/dL (ref 3.5–5.0)
ALT: 33 U/L (ref 17–63)
ANION GAP: 5 (ref 5–15)
AST: 25 U/L (ref 15–41)
Alkaline Phosphatase: 43 U/L (ref 38–126)
BUN: 22 mg/dL — ABNORMAL HIGH (ref 6–20)
CHLORIDE: 104 mmol/L (ref 101–111)
CO2: 29 mmol/L (ref 22–32)
Calcium: 9.2 mg/dL (ref 8.9–10.3)
Creatinine, Ser: 1.25 mg/dL — ABNORMAL HIGH (ref 0.61–1.24)
GFR calc Af Amer: >60 mL/min (ref >60)
GFR calc non Af Amer: 57 mL/min — ABNORMAL LOW (ref >60)
GLUCOSE: 80 mg/dL (ref 65–99)
POTASSIUM: 4.2 mmol/L (ref 3.5–5.1)
SODIUM: 138 mmol/L (ref 135–145)
Total Bilirubin: 1.1 mg/dL (ref 0.3–1.2)
Total Protein: 6.9 g/dL (ref 6.5–8.1)

## 2017-06-10 LAB — PROTIME-INR
INR: 0.99
Prothrombin Time: 13.1 seconds (ref 11.4–15.2)

## 2017-06-10 LAB — APTT: APTT: 27 s (ref 24–36)

## 2017-06-13 DIAGNOSIS — L578 Other skin changes due to chronic exposure to nonionizing radiation: Secondary | ICD-10-CM | POA: Diagnosis not present

## 2017-06-13 DIAGNOSIS — C44111 Basal cell carcinoma of skin of unspecified eyelid, including canthus: Secondary | ICD-10-CM | POA: Diagnosis not present

## 2017-06-16 ENCOUNTER — Telehealth: Payer: Self-pay | Admitting: Family Medicine

## 2017-06-16 ENCOUNTER — Telehealth: Payer: Self-pay | Admitting: *Deleted

## 2017-06-16 DIAGNOSIS — C61 Malignant neoplasm of prostate: Secondary | ICD-10-CM | POA: Diagnosis not present

## 2017-06-16 MED ORDER — CIPROFLOXACIN HCL 500 MG PO TABS: 500.0000 mg | ORAL_TABLET | Freq: Two times a day (BID) | ORAL | 0 refills | Status: DC

## 2017-06-16 NOTE — Telephone Encounter (Signed)
Called patient to remind of implant for 06-17-17, spoke with patient and he is aware of this procedure.

## 2017-06-16 NOTE — Telephone Encounter (Signed)
-----   Message from Festus Aloe, MD sent at 06/14/2017  5:54 PM EDT ----- Please send in: Ciprofloxacin 500 mg One by mouth twice a day Dispense # 10 No refill Instructions-begin 3 days prior to brachytherapy  Thank you   ----- Message ----- From: Nori Riis, PA-C Sent: 06/13/2017   8:30 AM To: Festus Aloe, MD  Just an FYI since he has an upcoming seed implant.  Most likely a skin contaminate.    ----- Message ----- From: Interface, [facility] Lab Results In Sent: 06/01/2017   4:39 PM To: Nori Riis, PA-C

## 2017-06-16 NOTE — Telephone Encounter (Signed)
Patient notified and will take medication.

## 2017-06-17 ENCOUNTER — Ambulatory Visit (HOSPITAL_BASED_OUTPATIENT_CLINIC_OR_DEPARTMENT_OTHER): Payer: PPO | Admitting: Anesthesiology

## 2017-06-17 ENCOUNTER — Ambulatory Visit (HOSPITAL_BASED_OUTPATIENT_CLINIC_OR_DEPARTMENT_OTHER)
Admission: RE | Admit: 2017-06-17 | Discharge: 2017-06-17 | Disposition: A | Discharge status: Home / Self Care | Payer: PPO | Source: Ambulatory Visit | Attending: Urology | Admitting: Urology

## 2017-06-17 ENCOUNTER — Encounter (HOSPITAL_BASED_OUTPATIENT_CLINIC_OR_DEPARTMENT_OTHER): Payer: Self-pay

## 2017-06-17 ENCOUNTER — Encounter (HOSPITAL_BASED_OUTPATIENT_CLINIC_OR_DEPARTMENT_OTHER)
Admission: RE | Disposition: A | Discharge status: Home / Self Care | Payer: Self-pay | Source: Ambulatory Visit | Attending: Urology

## 2017-06-17 ENCOUNTER — Ambulatory Visit (HOSPITAL_COMMUNITY): Payer: PPO

## 2017-06-17 DIAGNOSIS — L719 Rosacea, unspecified: Secondary | ICD-10-CM | POA: Diagnosis not present

## 2017-06-17 DIAGNOSIS — Z79899 Other long term (current) drug therapy: Secondary | ICD-10-CM | POA: Diagnosis not present

## 2017-06-17 DIAGNOSIS — C61 Malignant neoplasm of prostate: Secondary | ICD-10-CM | POA: Insufficient documentation

## 2017-06-17 DIAGNOSIS — Z87891 Personal history of nicotine dependence: Secondary | ICD-10-CM | POA: Diagnosis not present

## 2017-06-17 DIAGNOSIS — I10 Essential (primary) hypertension: Secondary | ICD-10-CM | POA: Diagnosis not present

## 2017-06-17 DIAGNOSIS — Z880 Allergy status to penicillin: Secondary | ICD-10-CM | POA: Diagnosis not present

## 2017-06-17 DIAGNOSIS — E559 Vitamin D deficiency, unspecified: Secondary | ICD-10-CM | POA: Insufficient documentation

## 2017-06-17 DIAGNOSIS — Z85828 Personal history of other malignant neoplasm of skin: Secondary | ICD-10-CM | POA: Diagnosis not present

## 2017-06-17 DIAGNOSIS — M545 Low back pain: Secondary | ICD-10-CM | POA: Diagnosis not present

## 2017-06-17 DIAGNOSIS — K219 Gastro-esophageal reflux disease without esophagitis: Secondary | ICD-10-CM | POA: Insufficient documentation

## 2017-06-17 DIAGNOSIS — M069 Rheumatoid arthritis, unspecified: Secondary | ICD-10-CM | POA: Diagnosis not present

## 2017-06-17 HISTORY — DX: Personal history of malignant neoplasm, unspecified: Z85.9

## 2017-06-17 HISTORY — DX: Personal history of other malignant neoplasm of skin: Z85.828

## 2017-06-17 HISTORY — DX: Other specified postprocedural states: Z98.890

## 2017-06-17 HISTORY — DX: Unspecified osteoarthritis, unspecified site: M19.90

## 2017-06-17 HISTORY — DX: Diverticulosis of large intestine without perforation or abscess without bleeding: K57.30

## 2017-06-17 HISTORY — PX: RADIOACTIVE SEED IMPLANT: SHX5150

## 2017-06-17 HISTORY — DX: Nocturia: R35.1

## 2017-06-17 HISTORY — DX: Vitamin D deficiency, unspecified: E55.9

## 2017-06-17 SURGERY — INSERTION, RADIATION SOURCE, PROSTATE
Anesthesia: General | Site: Prostate

## 2017-06-17 MED ORDER — CIPROFLOXACIN IN D5W 400 MG/200ML IV SOLN
400.0000 mg | INTRAVENOUS | Status: DC
  Filled 2017-06-17: qty 200

## 2017-06-17 MED ORDER — LACTATED RINGERS IV SOLN
INTRAVENOUS | Status: DC
  Administered 2017-06-17 (×2): via INTRAVENOUS
  Filled 2017-06-17: qty 1000

## 2017-06-17 MED ORDER — DEXAMETHASONE SODIUM PHOSPHATE 4 MG/ML IJ SOLN
INTRAMUSCULAR | Status: DC | PRN
  Administered 2017-06-17 (×2): 10 mg via INTRAVENOUS

## 2017-06-17 MED ORDER — OXYCODONE HCL 5 MG PO TABS
5.0000 mg | ORAL_TABLET | Freq: Once | ORAL | Status: DC | PRN
  Filled 2017-06-17: qty 1

## 2017-06-17 MED ORDER — ONDANSETRON HCL 4 MG/2ML IJ SOLN
INTRAMUSCULAR | Status: DC | PRN
  Administered 2017-06-17: 4 mg via INTRAVENOUS

## 2017-06-17 MED ORDER — FENTANYL CITRATE (PF) 100 MCG/2ML IJ SOLN
25.0000 ug | INTRAMUSCULAR | Status: DC | PRN
  Filled 2017-06-17: qty 1

## 2017-06-17 MED ORDER — IOHEXOL 300 MG/ML  SOLN
INTRAMUSCULAR | Status: DC | PRN
  Administered 2017-06-17: 7 mL

## 2017-06-17 MED ORDER — CIPROFLOXACIN IN D5W 400 MG/200ML IV SOLN
INTRAVENOUS | Status: AC
  Filled 2017-06-17: qty 200

## 2017-06-17 MED ORDER — STERILE WATER FOR IRRIGATION IR SOLN
Status: DC | PRN
  Administered 2017-06-17: 500 mL

## 2017-06-17 MED ORDER — FENTANYL CITRATE (PF) 100 MCG/2ML IJ SOLN
INTRAMUSCULAR | Status: AC
  Filled 2017-06-17: qty 2

## 2017-06-17 MED ORDER — EPHEDRINE SULFATE-NACL 50-0.9 MG/10ML-% IV SOSY
PREFILLED_SYRINGE | INTRAVENOUS | Status: DC | PRN
  Administered 2017-06-17: 10 mg via INTRAVENOUS

## 2017-06-17 MED ORDER — CEFAZOLIN SODIUM-DEXTROSE 2-4 GM/100ML-% IV SOLN
INTRAVENOUS | Status: AC
  Filled 2017-06-17: qty 100

## 2017-06-17 MED ORDER — DEXAMETHASONE SODIUM PHOSPHATE 10 MG/ML IJ SOLN
INTRAMUSCULAR | Status: AC
  Filled 2017-06-17: qty 1

## 2017-06-17 MED ORDER — FLEET ENEMA 7-19 GM/118ML RE ENEM
1.0000 | ENEMA | Freq: Once | RECTAL | Status: AC
  Administered 2017-06-17: 1 via RECTAL
  Filled 2017-06-17: qty 1

## 2017-06-17 MED ORDER — PROPOFOL 10 MG/ML IV BOLUS
INTRAVENOUS | Status: AC
  Filled 2017-06-17: qty 20

## 2017-06-17 MED ORDER — BELLADONNA ALKALOIDS-OPIUM 16.2-60 MG RE SUPP
RECTAL | Status: AC
  Filled 2017-06-17: qty 1

## 2017-06-17 MED ORDER — SODIUM CHLORIDE 0.9 % IV SOLN
INTRAVENOUS | Status: AC | PRN
  Administered 2017-06-17: 1000 mL

## 2017-06-17 MED ORDER — EPHEDRINE 5 MG/ML INJ
INTRAVENOUS | Status: AC
  Filled 2017-06-17: qty 10

## 2017-06-17 MED ORDER — FENTANYL CITRATE (PF) 100 MCG/2ML IJ SOLN
INTRAMUSCULAR | Status: DC | PRN
  Administered 2017-06-17 (×6): 25 ug via INTRAVENOUS
  Administered 2017-06-17: 50 ug via INTRAVENOUS

## 2017-06-17 MED ORDER — ONDANSETRON HCL 4 MG/2ML IJ SOLN
INTRAMUSCULAR | Status: AC
  Filled 2017-06-17: qty 2

## 2017-06-17 MED ORDER — PROPOFOL 10 MG/ML IV BOLUS
INTRAVENOUS | Status: DC | PRN
  Administered 2017-06-17: 170 mg via INTRAVENOUS

## 2017-06-17 MED ORDER — MIDAZOLAM HCL 5 MG/5ML IJ SOLN
INTRAMUSCULAR | Status: DC | PRN
  Administered 2017-06-17: 2 mg via INTRAVENOUS

## 2017-06-17 MED ORDER — MIDAZOLAM HCL 2 MG/2ML IJ SOLN
INTRAMUSCULAR | Status: AC
  Filled 2017-06-17: qty 2

## 2017-06-17 MED ORDER — CEFAZOLIN (ANCEF) 1 G IV SOLR
2.0000 g | INTRAVENOUS | Status: AC
  Administered 2017-06-17: 2 g
  Filled 2017-06-17: qty 2

## 2017-06-17 MED ORDER — TAMSULOSIN HCL 0.4 MG PO CAPS: 0.4000 mg | ORAL_CAPSULE | Freq: Every day | ORAL | 0 refills | Status: DC

## 2017-06-17 MED ORDER — OXYCODONE HCL 5 MG/5ML PO SOLN
5.0000 mg | Freq: Once | ORAL | Status: DC | PRN
  Filled 2017-06-17: qty 5

## 2017-06-17 MED ORDER — LIDOCAINE 2% (20 MG/ML) 5 ML SYRINGE
INTRAMUSCULAR | Status: DC | PRN
  Administered 2017-06-17: 60 mg via INTRAVENOUS

## 2017-06-17 MED ORDER — ONDANSETRON HCL 4 MG/2ML IJ SOLN
4.0000 mg | Freq: Four times a day (QID) | INTRAMUSCULAR | Status: DC | PRN
  Filled 2017-06-17: qty 2

## 2017-06-17 SURGICAL SUPPLY — 30 items
BAG URINE DRAINAGE (UROLOGICAL SUPPLIES) ×3 IMPLANT
BLADE CLIPPER SURG (BLADE) ×3 IMPLANT
CATH FOLEY 2WAY SLVR  5CC 16FR (CATHETERS) ×4
CATH FOLEY 2WAY SLVR 5CC 16FR (CATHETERS) ×2 IMPLANT
CATH ROBINSON RED A/P 20FR (CATHETERS) ×3 IMPLANT
CLOTH BEACON ORANGE TIMEOUT ST (SAFETY) ×3 IMPLANT
COVER BACK TABLE 60X90IN (DRAPES) ×3 IMPLANT
COVER MAYO STAND STRL (DRAPES) ×3 IMPLANT
DRSG TEGADERM 4X4.75 (GAUZE/BANDAGES/DRESSINGS) ×3 IMPLANT
DRSG TEGADERM 8X12 (GAUZE/BANDAGES/DRESSINGS) ×3 IMPLANT
GAUZE SPONGE 4X4 12PLY STRL LF (GAUZE/BANDAGES/DRESSINGS) ×3 IMPLANT
GLOVE BIO SURGEON STRL SZ7.5 (GLOVE) ×6 IMPLANT
GLOVE ECLIPSE 8.0 STRL XLNG CF (GLOVE) ×12 IMPLANT
GOWN STRL REUS W/ TWL LRG LVL3 (GOWN DISPOSABLE) ×1 IMPLANT
GOWN STRL REUS W/ TWL XL LVL3 (GOWN DISPOSABLE) ×1 IMPLANT
GOWN STRL REUS W/TWL LRG LVL3 (GOWN DISPOSABLE) ×2
GOWN STRL REUS W/TWL XL LVL3 (GOWN DISPOSABLE) ×2
HOLDER FOLEY CATH W/STRAP (MISCELLANEOUS) ×3 IMPLANT
IMPL SPACEOAR SYSTEM 10ML (MISCELLANEOUS) IMPLANT
IMPLANT SPACEOAR SYSTEM 10ML (MISCELLANEOUS)
IV NS 1000ML (IV SOLUTION) ×4
IV NS 1000ML BAXH (IV SOLUTION) ×2 IMPLANT
KIT RM TURNOVER CYSTO AR (KITS) ×3 IMPLANT
PACK CYSTO (CUSTOM PROCEDURE TRAY) ×3 IMPLANT
Radioactive seed ×219 IMPLANT
SYRINGE 10CC LL (SYRINGE) ×3 IMPLANT
TUBE CONNECTING 12'X1/4 (SUCTIONS)
TUBE CONNECTING 12X1/4 (SUCTIONS) IMPLANT
UNDERPAD 30X30 INCONTINENT (UNDERPADS AND DIAPERS) ×6 IMPLANT
WATER STERILE IRR 500ML POUR (IV SOLUTION) ×3 IMPLANT

## 2017-06-17 NOTE — Anesthesia Procedure Notes (Signed)
Procedure Name: LMA Insertion Date/Time: 06/17/2017 7:43 AM Performed by: Denna Haggard D Pre-anesthesia Checklist: Patient identified, Emergency Drugs available, Suction available and Patient being monitored Patient Re-evaluated:Patient Re-evaluated prior to induction Oxygen Delivery Method: Circle system utilized Preoxygenation: Pre-oxygenation with 100% oxygen Induction Type: IV induction Ventilation: Mask ventilation without difficulty LMA: LMA inserted LMA Size: 4.0 Number of attempts: 1 Airway Equipment and Method: Bite block Placement Confirmation: positive ETCO2 Tube secured with: Tape Dental Injury: Teeth and Oropharynx as per pre-operative assessment

## 2017-06-17 NOTE — H&P (Signed)
Urology Admission H&P  Chief Complaint: Prostate cancer   History of Present Illness:   71 yo with Prostate Ca. He presents for brachytherapy. He was noted to have a rising PSA of 7.1 with a negative prior prostate biopsy about 8 years ago by Dr. Jacqlyn Larsen in [place]. This prompted an MRI of the prostate on 01/19/17 that demonstrated a concerning 2.5 x 2.1 cm PI RADS-5 right anterolateral apical lesion. TRUS biopsy of the prostate was performed on 02/28/17 with attention to the area of concern on his MRI. 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   Family history: Brother who is undergoing XRT by Dr. [name].   Imaging studies: 01/19/17- MRI - Findings as mentioned above with a question of EPE at the R apex but no LAD or SVI.   PMH: He has a history of GERD, rheumatoid arthritis, and hypertension.  PSH: No abdominal surgeries.   TNM stage: cT1c Nx Mx  PSA: 7.1  Gleason score: 3+3=6 (read as 3+4=7 in one core by Dr. [name])  Biopsy (02/28/17, read by Dr. Elayne Guerin - [facility]): 7/13 cores positive  Left: L mid (3%, 3+3=6), L lateral base (20%, 3+3=6), L base (30%, 3+3=6)  Right: R apex (97%, 3+3=6, PNI), R lateral apex (66^, 3+3=6, PNI), R lateral mid (76%, 3+3=6, 2/2 cores) - Dr. [name] read one core in the right apex as 3+4=7  Prostate volume: 33.4 cc  PSAD: 0.21   Nomogram  OC disease: 48%  EPE: 52%  SVI: 2%  LNI: 2%  PFS (5 year, 10 year): 90%, 83%   Urinary function: IPSS is 5.  Erectile function: SHIM score is 25.     ALLERGIES: Penicillin -- per pt an "I don't know" -- was a reaction as a child. No h/o SOB, swelling or other serious reaction.   He's well today with no dysuria or gross hematuria.   Past Medical History:  Diagnosis Date  . Diverticulosis of colon   . GERD (gastroesophageal reflux disease)    "occas"  .  History of basal cell carcinoma (BCC) excision    09-25-2014  right lower eyelid s/p moh's dx /  left lower eyelid scheduled removal 07-05-2017  . History of squamous cell carcinoma excision    01/ 2015  left lateral cheek  s/p  moh's sx  . Hypertension    sees Dr. Jackalyn Lombard, [place]  . Nocturia   . OA (osteoarthritis)   . Prostate cancer [facility]) urologist-  dr eskidge/  oncologist- dr [name]   dx 04/ 2018-- Stage T1c, Gleason 3+3,  PSA 7.1,  vol 38.6cc  . Rheumatoid arteritis    rheumotologist-   dr Elpidio Galea  . Rosacea    Dr. Sharlett Iles  . Vitamin D deficiency    Past Surgical History:  Procedure Laterality Date  . ACHILLES TENDON SURGERY Right 1984  . COLONOSCOPY  2008  . MOHS SURGERY  09-25-2014;  11-27-2013   [facility]   09-25-2014 right lower eyelid /  11-27-2013  left lateral cheek  . TONSILLECTOMY  child  . TOTAL KNEE ARTHROPLASTY  12/04/2012   Procedure: TOTAL KNEE ARTHROPLASTY;  Surgeon: Vickey Huger, MD;  Location: [facility];  Service: Orthopedics;  Laterality: Left;  left total knee arthroplasty  . VARICOSE VEIN SURGERY  2000   left leg    Home Medications:  Prescriptions Prior to Admission  Medication Sig  Dispense Refill Last Dose  . cholecalciferol (VITAMIN D) 1000 units tablet Take 1,000 Units by mouth every morning.    06/16/2017 at Unknown time  . ciprofloxacin (CIPRO) 500 MG tablet Take 1 tablet (500 mg total) by mouth 2 (two) times daily. Instructions- begin 3 days prior to Brachytherapy 10 tablet 0 06/17/2017 at 0500  . folic acid (FOLVITE) 1 MG tablet TAKE 1 TABLET EVERY DAY (Patient taking differently: TAKE 1 TABLET EVERY DAY--- takes in am) 90 tablet 0 06/16/2017 at Unknown time  . losartan (COZAAR) 100 MG tablet TAKE 1 TABLET EVERY DAY (Patient taking differently: TAKE 1 TABLET EVERY DAY--- takes in am) 90 tablet 2 06/17/2017 at 0500  . meloxicam (MOBIC) 15 MG tablet TAKE 1 TABLET BY MOUTH ONCE DAILY WITH FOOD (Patient taking differently: TAKE 1 TABLET BY MOUTH ONCE  DAILY WITH FOOD--- takes in am) 30 tablet 2 06/13/2017  . methotrexate 50 MG/2ML injection INJECT 1ML WEEKLY (Patient taking differently: INJECT 1ML WEEKLY--- Monday evening's) 4 mL 6 06/06/2017 at Unknown time   Allergies:  Allergies  Allergen Reactions  . Penicillins Other (See Comments)    Unknown childhood reaction    Family History  Problem Relation Age of Onset  . Cancer Mother        Multiple Myeloma  . Alcohol abuse Father   . Prostate cancer Brother   . Hematuria Neg Hx   . Sickle cell anemia Neg Hx   . Kidney cancer Neg Hx   . Bladder Cancer Neg Hx    Social History:  reports that he has never smoked. He quit smokeless tobacco use about a year ago. His smokeless tobacco use included Chew. He reports that he drinks about 8.4 oz of alcohol per week . He reports that he does not use drugs.  Review of Systems  Musculoskeletal: Positive for joint pain.  Skin: Positive for rash.  All other systems reviewed and are negative. Rt shoulder pain - hurtplaying golf Left forearm purpura near watchband   Physical Exam:  Vital signs in last 24 hours: Temp:  [98.4 F (36.9 C)] 98.4 F (36.9 C) (08/17 0625) Pulse Rate:  [75] 75 (08/17 0625) Resp:  [18] 18 (08/17 0625) BP: (141)/(87) 141/87 (08/17 0625) SpO2:  [98 %] 98 % (08/17 0625) Weight:  [90.9 kg (200 lb 8 oz)] 90.9 kg (200 lb 8 oz) (08/17 2563) Physical Exam NAD CV - RRR Lungs - regular effort and depth  Abd - soft, NT  Laboratory Data:  No results found for this or any previous visit (from the past 24 hour(s)). No results found for this or any previous visit (from the past 240 hour(s)). Creatinine:  Recent Labs  06/10/17 0900  CREATININE 1.25*     Impression/Assessment:  Prostate Cancer   Plan:  I discussed with the patient the nature, potential benefits, risks and alternatives to Prostate seed implantation and cystoscopy, including side effects of the proposed treatment, the likelihood of the patient  achieving the goals of the procedure, and any potential problems that might occur during the procedure or recuperation. Discussed risks such as incomplete treatment of the prostate cancer, bleeding, scarring, lower urinary tract symptoms and retention among others. We also discussed cross reactivity with penicillin and cefazolin but on review he has not had any serious reactions to penicillin that he recalls. Will monitor. All questions answered. Patient elects to proceed.    Areeba Sulser 06/17/2017, 7:31 AM

## 2017-06-17 NOTE — Discharge Instructions (Signed)
°Post Anesthesia Home Care Instructions ° °Activity: °Get plenty of rest for the remainder of the day. A responsible individual must stay with you for 24 hours following the procedure.  °For the next 24 hours, DO NOT: °-Drive a car °-Operate machinery °-Drink alcoholic beverages °-Take any medication unless instructed by your physician °-Make any legal decisions or sign important papers. ° °Meals: °Start with liquid foods such as gelatin or soup. Progress to regular foods as tolerated. Avoid greasy, spicy, heavy foods. If nausea and/or vomiting occur, drink only clear liquids until the nausea and/or vomiting subsides. Call your physician if vomiting continues. ° °Special Instructions/Symptoms: °Your throat may feel dry or sore from the anesthesia or the breathing tube placed in your throat during surgery. If this causes discomfort, gargle with warm salt water. The discomfort should disappear within 24 hours. ° °If you had a scopolamine patch placed behind your ear for the management of post- operative nausea and/or vomiting: ° °1. The medication in the patch is effective for 72 hours, after which it should be removed.  Wrap patch in a tissue and discard in the trash. Wash hands thoroughly with soap and water. °2. You may remove the patch earlier than 72 hours if you experience unpleasant side effects which may include dry mouth, dizziness or visual disturbances. °3. Avoid touching the patch. Wash your hands with soap and water after contact with the patch. °  ° ° ° ° °Cystoscopy, Care After °Refer to this sheet in the next few weeks. These instructions provide you with information about caring for yourself after your procedure. Your health care provider may also give you more specific instructions. Your treatment has been planned according to current medical practices, but problems sometimes occur. Call your health care provider if you have any problems or questions after your procedure. °What can I expect after  the procedure? °After the procedure, it is common to have: °· Mild pain when you urinate. Pain should stop within a few minutes after you urinate. This may last for up to 1 week. °· A small amount of blood in your urine for several days. °· Feeling like you need to urinate but producing only a small amount of urine. ° °Follow these instructions at home: ° °Medicines °· Take over-the-counter and prescription medicines only as told by your health care provider. °· If you were prescribed an antibiotic medicine, take it as told by your health care provider. Do not stop taking the antibiotic even if you start to feel better. °General instructions ° °· Return to your normal activities as told by your health care provider. Ask your health care provider what activities are safe for you. °· Do not drive for 24 hours if you received a sedative. °· Watch for any blood in your urine. If the amount of blood in your urine increases, call your health care provider. °· Follow instructions from your health care provider about eating or drinking restrictions. °· If a tissue sample was removed for testing (biopsy) during your procedure, it is your responsibility to get your test results. Ask your health care provider or the department performing the test when your results will be ready. °· Drink enough fluid to keep your urine clear or pale yellow. °· Keep all follow-up visits as told by your health care provider. This is important. °Contact a health care provider if: °· You have pain that gets worse or does not get better with medicine, especially pain when you urinate. °· You have   right away if:  You have more blood in your urine.  You have blood clots in your urine.  You have abdominal pain.  You have a fever or chills.  You are unable to urinate. This information is not intended to replace advice given to you by your health care provider. Make sure you discuss any questions you have with your  health care provider. Document Released: 05/07/2005 Document Revised: 03/25/2016 Document Reviewed: 09/04/2015 Elsevier Interactive Patient Education  2017 [employer] for Prostate Cancer Brachytherapy for prostate cancer is radiation treatment that is placed inside of the prostate (prostate gland). There are several types of brachytherapy:  Low-dose rate (LDR) therapy. This may involve temporary implants or permanent radioactive seed or pellet implants. The radiation does not travel far from the prostate, which means that healthy, noncancerous tissues around the prostate receive only a small dose of radiation. This helps to protect those tissues from injury. This type of treatment may be followed by a course of external beam radiation. ? You have -- Permanent low-dose implants (seeds or pellets) are injected into the prostate, and they work for up to one year after they are inserted. They are left in place and are not removed.    Brachytherapy for prostate cancer is radiation treatment placed inside of the prostate (prostate gland).  Permanent low-dose implants are injected into the prostate and left in place. They work for up to one year after they are inserted.  This information is not intended to replace advice given to you by your health care provider. Make sure you discuss any questions you have with your health care provider. Document Released: 03/28/2006 Document Revised: 10/27/2016 Document Reviewed: 10/27/2016 Elsevier Interactive Patient Education  2017 [employer].

## 2017-06-17 NOTE — Anesthesia Postprocedure Evaluation (Signed)
Anesthesia Post Note  Patient: Julian Pineda  Procedure(s) Performed: Procedure(s) (LRB): RADIOACTIVE SEED IMPLANT/BRACHYTHERAPY IMPLANT (N/A)     Patient location during evaluation: PACU Anesthesia Type: General Level of consciousness: awake and alert and patient cooperative Pain management: pain level controlled Vital Signs Assessment: post-procedure vital signs reviewed and stable Respiratory status: spontaneous breathing and respiratory function stable Cardiovascular status: stable Anesthetic complications: no    Last Vitals:  Vitals:   06/17/17 0945 06/17/17 1024  BP: 120/62 125/67  Pulse: 73 71  Resp: 13 12  Temp:  36.4 C  SpO2: 96% 96%    Last Pain:  Vitals:   06/17/17 0625  TempSrc: Oral                 Grazia Taffe S

## 2017-06-17 NOTE — Op Note (Addendum)
Preoperative diagnosis: Prostate cancer Postoperative diagnosis: Prostate cancer  Procedure: Prostate permanent radioactive seed implantation; cystoscopy  Surgeon: Junious Silk  Radiation Oncologist: [name]  Indication for procedure: 71 year old white male with localized prostate cancer who elected to proceed with prostate brachytherapy.  Findings: On cystoscopy the urethra, prostatic urethra and bladder appeared normal. Trigone and ureteral orifices appeared normal. There were no seeds, foreign bodies or stones in the bladder. The bladder and urethral mucosa appeared normal. Prostate was of a normal size and nonobstructive.  Dose: 145 Gy   Description of procedure: After consent was obtained patient brought to the operating room. After adequate anesthesia he was placed in lithotomy position and prepped and draped in the usual sterile fashion. A timeout was performed to confirm the patient and procedure. A Foley catheter was placed. Dr. [name] placed the ultrasound probe can set up the prostate per the template. Per the template under ultrasound guidance, 21 catheters were used to place 73 active permanent radioactive seeds for total prescribed dose of 145 Gy. Post placement fluoroscopic image was obtained showing accurate prostate coverage. The Foley catheter was removed and I performed flexible cystoscopy. This was unremarkable or normal. The bladder was drained and the scope removed. The patient was awakened and taken to the recovery room in stable condition.  Complications: None Blood loss: Minimal Specimens: None Drains: None Disposition: Patient stable to PACU

## 2017-06-17 NOTE — Transfer of Care (Signed)
Immediate Anesthesia Transfer of Care Note  Patient: Julian Pineda  Procedure(s) Performed: Procedure(s) (LRB): RADIOACTIVE SEED IMPLANT/BRACHYTHERAPY IMPLANT (N/A)  Patient Location: PACU  Anesthesia Type: General  Level of Consciousness: awake, oriented, sedated and patient cooperative  Airway & Oxygen Therapy: Patient Spontanous Breathing and Patient connected to face mask oxygen  Post-op Assessment: Report given to PACU RN and Post -op Vital signs reviewed and stable  Post vital signs: Reviewed and stable  Complications: No apparent anesthesia complications  Last Vitals:  Vitals:   06/17/17 0625 06/17/17 0918  BP: (!) 141/87 114/62  Pulse: 75 76  Resp: 18 10  Temp: 36.9 C 36.6 C  SpO2: 98% 97%    Last Pain:  Vitals:   06/17/17 0625  TempSrc: Oral

## 2017-06-17 NOTE — Progress Notes (Signed)
  Radiation Oncology         [phone] ________________________________  Name: Julian Pineda MRN: [id number]  Date: 06/17/2017  DOB: [birth year]       Prostate Seed Implant  VO:HKGOVPCHEK, [name], MD  No ref. provider found  DIAGNOSIS: 71 y.o. gentleman with stage T1c adenocarcinoma of the prostate with a Gleason's score of 3+4 and a PSA of 7.0    ICD-10-CM   1. Prostate cancer ([place]) C61 DG Chest 2 View    DG Chest 2 View    Discharge patient    PROCEDURE: Insertion of radioactive I-125 seeds into the prostate gland.  RADIATION DOSE: 145 Gy, definitive therapy.  TECHNIQUE: Julian Pineda was brought to the operating room with the urologist. He was placed in the dorsolithotomy position. He was catheterized and a rectal tube was inserted. The perineum was shaved, prepped and draped. The ultrasound probe was then introduced into the rectum to see the prostate gland.  TREATMENT DEVICE: A needle grid was attached to the ultrasound probe stand and anchor needles were placed.  3D PLANNING: The prostate was imaged in 3D using a sagittal sweep of the prostate probe. These images were transferred to the planning computer. There, the prostate, urethra and rectum were defined on each axial reconstructed image. Then, the software created an optimized 3D plan and a few seed positions were adjusted. The quality of the plan was reviewed using [facility] information for the target and the following two organs at risk:  Urethra and Rectum.  Then the accepted plan was uploaded to the seed Select[employer] afterloading unit.  PROSTATE VOLUME STUDY:  Using transrectal ultrasound the volume of the prostate was verified to be 43.9 cc.  SPECIAL TREATMENT PROCEDURE/SUPERVISION AND HANDLING: The Nucletron FIRST system was used to place the needles under sagittal guidance. A total of 21 needles were used to deposit 73 seeds in the prostate gland. The individual seed activity was 0.459 mCi.  SpaceOAR:  No  COMPLEX  SIMULATION: At the end of the procedure, an anterior radiograph of the pelvis was obtained to document seed positioning and count. Cystoscopy was performed to check the urethra and bladder.  MICRODOSIMETRY: At the end of the procedure, the patient was emitting 0.08 mR/hr at 1 meter. Accordingly, he was considered safe for hospital discharge.  PLAN: The patient will return to the radiation oncology clinic for post implant CT dosimetry in three weeks.   ________________________________  Sheral Apley [name], M.D.

## 2017-06-17 NOTE — Anesthesia Preprocedure Evaluation (Signed)
Anesthesia Evaluation  Patient identified by MRN, date of birth, ID band Patient awake    Reviewed: Allergy & Precautions, H&P , NPO status , Patient's Chart, lab work & pertinent test results  Airway Mallampati: II   Neck ROM: full    Dental   Pulmonary neg pulmonary ROS,    breath sounds clear to auscultation       Cardiovascular hypertension,  Rhythm:regular Rate:Normal     Neuro/Psych    GI/Hepatic GERD  ,  Endo/Other    Renal/GU    Prostate CA    Musculoskeletal  (+) Arthritis ,   Abdominal   Peds  Hematology   Anesthesia Other Findings   Reproductive/Obstetrics                             Anesthesia Physical Anesthesia Plan  ASA: II  Anesthesia Plan: General   Post-op Pain Management:    Induction: Intravenous  PONV Risk Score and Plan: 2 and Ondansetron, Dexamethasone, Treatment may vary due to age or medical condition and Midazolam  Airway Management Planned: LMA  Additional Equipment:   Intra-op Plan:   Post-operative Plan:   Informed Consent: I have reviewed the patients History and Physical, chart, labs and discussed the procedure including the risks, benefits and alternatives for the proposed anesthesia with the patient or authorized representative who has indicated his/her understanding and acceptance.     Plan Discussed with: CRNA, Anesthesiologist and Surgeon  Anesthesia Plan Comments:         Anesthesia Quick Evaluation

## 2017-06-20 ENCOUNTER — Encounter (HOSPITAL_BASED_OUTPATIENT_CLINIC_OR_DEPARTMENT_OTHER): Payer: Self-pay | Admitting: Urology

## 2017-06-22 DIAGNOSIS — M25512 Pain in left shoulder: Secondary | ICD-10-CM | POA: Diagnosis not present

## 2017-06-22 DIAGNOSIS — M7541 Impingement syndrome of right shoulder: Secondary | ICD-10-CM | POA: Diagnosis not present

## 2017-06-24 DIAGNOSIS — M0589 Other rheumatoid arthritis with rheumatoid factor of multiple sites: Secondary | ICD-10-CM | POA: Diagnosis not present

## 2017-06-24 DIAGNOSIS — M19049 Primary osteoarthritis, unspecified hand: Secondary | ICD-10-CM | POA: Diagnosis not present

## 2017-06-24 DIAGNOSIS — E663 Overweight: Secondary | ICD-10-CM | POA: Diagnosis not present

## 2017-06-24 DIAGNOSIS — R5382 Chronic fatigue, unspecified: Secondary | ICD-10-CM | POA: Diagnosis not present

## 2017-06-24 DIAGNOSIS — Z79899 Other long term (current) drug therapy: Secondary | ICD-10-CM | POA: Diagnosis not present

## 2017-06-24 DIAGNOSIS — Z6829 Body mass index (BMI) 29.0-29.9, adult: Secondary | ICD-10-CM | POA: Diagnosis not present

## 2017-06-27 DIAGNOSIS — M67912 Unspecified disorder of synovium and tendon, left shoulder: Secondary | ICD-10-CM | POA: Diagnosis not present

## 2017-06-27 DIAGNOSIS — M25512 Pain in left shoulder: Secondary | ICD-10-CM | POA: Diagnosis not present

## 2017-06-27 DIAGNOSIS — M75122 Complete rotator cuff tear or rupture of left shoulder, not specified as traumatic: Secondary | ICD-10-CM | POA: Diagnosis not present

## 2017-06-27 DIAGNOSIS — M24812 Other specific joint derangements of left shoulder, not elsewhere classified: Secondary | ICD-10-CM | POA: Diagnosis not present

## 2017-06-27 DIAGNOSIS — M19012 Primary osteoarthritis, left shoulder: Secondary | ICD-10-CM | POA: Diagnosis not present

## 2017-06-29 NOTE — Progress Notes (Signed)
06/30/2017 1:47 PM   Julian Pineda [birth year] [id number]  Referring provider: Leone Haven, MD [address] [place], [place] [address]  Chief Complaint  Patient presents with  . Post-op Follow-up    HPI: Patient is a 71 year old Caucasian male with prostate cancer who presents today for follow up after undergoing prostate seed implantation on 06/17/2017 with Dr. Junious Silk.  Background history Low Risk Prostate Cancer - Apr 2018 PSA 7.1 T1c Prostate 33 grams Prostate MRI(01/19/2017) - Prostate 38.6 grams; 6 mm right mid lesion, 2.5 cm right mid to apex lesion -- PiRADs 5; capsule indistinct at the right apex possible ECE, Neg SV, Neg LN, Neg LAD, Neg bone met  Biopsy: Gleason 3+3 = 6, left base 2, 20-30% Gleason 3+3 = 6 left mid, 3% Gleason 3+3 = 6, right mid and right apex x 3 cognitive fusion (ROI on MRI), 66-97% 6 cores total positive   BPH WITH LUTS  (prostate and/or bladder) His IPSS score was 6, which is mild lower urinary tract symptomatology. He is mostly satisfied with his quality life due to his urinary symptoms.   His major complaints today are frequency and nocturia.   He has had these symptoms for a few years.  He denies any dysuria, hematuria or suprapubic pain.   He also denies any recent fevers, chills, nausea or vomiting. His brother has been diagnosed with prostate cancer.    Today, he is experiencing frequency and nocturia.  He denies any gross hematuria, suprapubic pain or dysuria. He's not had any recent fevers, chills, nausea or vomiting. He is not having difficulty with bowel movements or blood in the stool.     IPSS    Row Name 06/01/17 1300         International Prostate Symptom Score   How often have you had the sensation of not emptying your bladder? Less than 1 in 5     How often have you had to urinate less than every two hours? About half the time     How often have you found you stopped and started again several times when  you urinated? Not at All     How often have you found it difficult to postpone urination? Not at All     How often have you had a weak urinary stream? Not at All     How often have you had to strain to start urination? Not at All     How many times did you typically get up at night to urinate? 2 Times     Total IPSS Score 6       Quality of Life due to urinary symptoms   If you were to spend the rest of your life with your urinary condition just the way it is now how would you feel about that? Mostly Satisfied        Score:  1-7 Mild 8-19 Moderate 20-35 Severe   Erectile dysfunction His SHIM score was 25, which is no ED.   His libido is preserved.   His risk factors for ED are age, pelvic radiation, BPH, prostate cancer and HTN, He denies any painful erections or curvatures with his erections.   He is still having/no longer having spontaneous erections.      SHIM    Row Name 06/01/17 1340         SHIM: Over the last 6 months:   How do you rate your confidence that you could  get and keep an erection? Very High     When you had erections with sexual stimulation, how often were your erections hard enough for penetration (entering your partner)? Almost Always or Always     During sexual intercourse, how often were you able to maintain your erection after you had penetrated (entered) your partner? Almost Always or Always     During sexual intercourse, how difficult was it to maintain your erection to completion of intercourse? Not Difficult     When you attempted sexual intercourse, how often was it satisfactory for you? Almost Always or Always       SHIM Total Score   SHIM 25        Score: 1-7 Severe ED 8-11 Moderate ED 12-16 Mild-Moderate ED 17-21 Mild ED 22-25 No ED  PMH: Past Medical History:  Diagnosis Date  . Diverticulosis of colon   . GERD (gastroesophageal reflux disease)    "occas"  . History of basal cell carcinoma (BCC) excision    09-25-2014  right lower  eyelid s/p moh's dx /  left lower eyelid scheduled removal 07-05-2017  . History of squamous cell carcinoma excision    01/ 2015  left lateral cheek  s/p  moh's sx  . Hypertension    sees Dr. Jackalyn Lombard, [place]  . Nocturia   . OA (osteoarthritis)   . Prostate cancer [facility]) urologist-  dr eskidge/  oncologist- dr [name]   dx 04/ 2018-- Stage T1c, Gleason 3+3,  PSA 7.1,  vol 38.6cc  . Rheumatoid arteritis    rheumotologist-   dr Elpidio Galea  . Rosacea    Dr. Sharlett Iles  . Vitamin D deficiency     Surgical History: Past Surgical History:  Procedure Laterality Date  . ACHILLES TENDON SURGERY Right 1984  . COLONOSCOPY  2008  . MOHS SURGERY  09-25-2014;  11-27-2013   [facility]   09-25-2014 right lower eyelid /  11-27-2013  left lateral cheek  . RADIOACTIVE SEED IMPLANT N/A 06/17/2017   Procedure: RADIOACTIVE SEED IMPLANT/BRACHYTHERAPY IMPLANT;  Surgeon: Festus Aloe, MD;  Location: [facility];  Service: Urology;  Laterality: N/A;  . TONSILLECTOMY  child  . TOTAL KNEE ARTHROPLASTY  12/04/2012   Procedure: TOTAL KNEE ARTHROPLASTY;  Surgeon: Vickey Huger, MD;  Location: [place];  Service: Orthopedics;  Laterality: Left;  left total knee arthroplasty  . VARICOSE VEIN SURGERY  2000   left leg    Home Medications:  Allergies as of 06/30/2017      Reactions   Penicillins Other (See Comments)   Unknown childhood reaction      Medication List       Accurate as of 06/30/17  1:47 PM. Always use your most recent med list.          cholecalciferol 1000 units tablet Commonly known as:  VITAMIN D Take 1,000 Units by mouth every morning.   folic acid 1 MG tablet Commonly known as:  FOLVITE TAKE 1 TABLET EVERY DAY   losartan 100 MG tablet Commonly known as:  COZAAR TAKE 1 TABLET EVERY DAY   meloxicam 15 MG tablet Commonly known as:  MOBIC TAKE 1 TABLET BY MOUTH ONCE DAILY WITH FOOD   methotrexate 50 MG/2ML injection INJECT 1ML WEEKLY   tamsulosin 0.4 MG Caps  capsule Commonly known as:  FLOMAX Take 1 capsule (0.4 mg total) by mouth daily after supper.            Discharge Care Instructions  Start     Ordered   06/30/17 0000  PSA     06/30/17 1346      Allergies:  Allergies  Allergen Reactions  . Penicillins Other (See Comments)    Unknown childhood reaction    Family History: Family History  Problem Relation Age of Onset  . Cancer Mother        Multiple Myeloma  . Alcohol abuse Father   . Prostate cancer Brother   . Hematuria Neg Hx   . Sickle cell anemia Neg Hx   . Kidney cancer Neg Hx   . Bladder Cancer Neg Hx     Social History:  reports that he has never smoked. He quit smokeless tobacco use about 12 months ago. His smokeless tobacco use included Chew. He reports that he drinks about 8.4 oz of alcohol per week . He reports that he does not use drugs.  ROS: UROLOGY Frequent Urination?: Yes Hard to postpone urination?: No Burning/pain with urination?: No Get up at night to urinate?: Yes Leakage of urine?: No Urine stream starts and stops?: No Trouble starting stream?: No Do you have to strain to urinate?: No Blood in urine?: No Urinary tract infection?: No Sexually transmitted disease?: No Injury to kidneys or bladder?: No Painful intercourse?: No Weak stream?: No Erection problems?: No Penile pain?: No  Gastrointestinal Nausea?: No Vomiting?: No Indigestion/heartburn?: No Diarrhea?: No Constipation?: No  Constitutional Fever: No Night sweats?: No Weight loss?: No Fatigue?: No  Skin Skin rash/lesions?: No Itching?: No  Eyes Blurred vision?: No Double vision?: No  Ears/Nose/Throat Sore throat?: No Sinus problems?: No  Hematologic/Lymphatic Swollen glands?: No Easy bruising?: No  Cardiovascular Leg swelling?: No Chest pain?: No  Respiratory Cough?: No Shortness of breath?: No  Endocrine Excessive thirst?: No  Musculoskeletal Back pain?: No Joint pain?:  No  Neurological Headaches?: No Dizziness?: No  Psychologic Depression?: No Anxiety?: No  Physical Exam: There were no vitals taken for this visit.  Constitutional:  Alert and oriented, No acute distress. HEENT: [place] AT, moist mucus membranes.  Trachea midline, no masses. Cardiovascular: No clubbing, cyanosis, or edema.  S1, S2 without murmurs, rubs or gallops Respiratory: Normal respiratory effort, no increased work of breathing.  CTAB GI: Abdomen is soft, nontender, nondistended, no abdominal masses GU: No CVA tenderness. Skin: No rashes, bruises or suspicious lesions. Lymph: No cervical or inguinal adenopathy. Neurologic: Grossly intact, no focal deficits, moving all 4 extremities. Psychiatric: Normal mood and affect.  Laboratory Data: Lab Results  Component Value Date   WBC 5.6 06/10/2017   HGB 15.0 06/10/2017   HCT 43.2 06/10/2017   MCV 96.6 06/10/2017   PLT 222 06/10/2017    Lab Results  Component Value Date   CREATININE 1.25 (H) 06/10/2017    Lab Results  Component Value Date   PSA 4.96 (H) 07/28/2016   I have reviewed the labs   Assessment & Plan:    1. Low risk prostate cancer -  S/p prostate seed implantation on 06/17/2017             - doing well             - PSA in 3 months  Return in about 3 months (around 09/30/2017) for PSA only.  Zara Council, [facility] [address], [place], [place] [address] [phone]

## 2017-06-30 ENCOUNTER — Ambulatory Visit: Payer: PPO | Admitting: Urology

## 2017-06-30 DIAGNOSIS — M25511 Pain in right shoulder: Secondary | ICD-10-CM | POA: Diagnosis not present

## 2017-06-30 DIAGNOSIS — C61 Malignant neoplasm of prostate: Secondary | ICD-10-CM

## 2017-07-04 NOTE — Progress Notes (Signed)
  Radiation Oncology         [phone]) [phone] ________________________________  Name: Julian Pineda MRN: [id number]  Date: 07/08/2017  DOB: [birth year]  COMPLEX SIMULATION NOTE  NARRATIVE:  The patient was brought to the [facility] today following prostate seed implantation approximately one month ago.  Identity was confirmed.  All relevant records and images related to the planned course of therapy were reviewed.  Then, the patient was set-up supine.  CT images were obtained.  The CT images were loaded into the planning software.  Then the prostate and rectum were contoured.  Treatment planning then occurred.  The implanted iodine 125 seeds were identified by the physics staff for projection of radiation distribution  I have requested : 3D Simulation  I have requested a DVH of the following structures: Prostate and rectum.    ________________________________  Sheral Apley [name], M.D.

## 2017-07-05 DIAGNOSIS — Z85828 Personal history of other malignant neoplasm of skin: Secondary | ICD-10-CM | POA: Diagnosis not present

## 2017-07-05 DIAGNOSIS — C44119 Basal cell carcinoma of skin of left eyelid, including canthus: Secondary | ICD-10-CM | POA: Diagnosis not present

## 2017-07-07 ENCOUNTER — Telehealth: Payer: Self-pay | Admitting: *Deleted

## 2017-07-07 NOTE — Telephone Encounter (Signed)
Called patient to remind of appts. for 07-08-17, lvm for a return call

## 2017-07-08 ENCOUNTER — Encounter: Payer: Self-pay | Admitting: Medical Oncology

## 2017-07-08 ENCOUNTER — Ambulatory Visit
Admission: RE | Admit: 2017-07-08 | Discharge: 2017-07-08 | Disposition: A | Discharge status: Home / Self Care | Payer: PPO | Source: Ambulatory Visit | Attending: Radiation Oncology | Admitting: Radiation Oncology

## 2017-07-08 VITALS — BP 155/82 | HR 78 | Resp 16 | Wt 204.6 lb

## 2017-07-08 DIAGNOSIS — Z809 Family history of malignant neoplasm, unspecified: Secondary | ICD-10-CM | POA: Insufficient documentation

## 2017-07-08 DIAGNOSIS — Z79899 Other long term (current) drug therapy: Secondary | ICD-10-CM | POA: Insufficient documentation

## 2017-07-08 DIAGNOSIS — Z9889 Other specified postprocedural states: Secondary | ICD-10-CM | POA: Diagnosis not present

## 2017-07-08 DIAGNOSIS — M069 Rheumatoid arthritis, unspecified: Secondary | ICD-10-CM | POA: Diagnosis not present

## 2017-07-08 DIAGNOSIS — L719 Rosacea, unspecified: Secondary | ICD-10-CM | POA: Insufficient documentation

## 2017-07-08 DIAGNOSIS — C61 Malignant neoplasm of prostate: Secondary | ICD-10-CM

## 2017-07-08 DIAGNOSIS — I10 Essential (primary) hypertension: Secondary | ICD-10-CM | POA: Diagnosis not present

## 2017-07-08 DIAGNOSIS — Z510 Encounter for antineoplastic radiation therapy: Secondary | ICD-10-CM | POA: Diagnosis not present

## 2017-07-08 DIAGNOSIS — Z96652 Presence of left artificial knee joint: Secondary | ICD-10-CM | POA: Insufficient documentation

## 2017-07-08 DIAGNOSIS — Z811 Family history of alcohol abuse and dependence: Secondary | ICD-10-CM | POA: Insufficient documentation

## 2017-07-08 DIAGNOSIS — Z880 Allergy status to penicillin: Secondary | ICD-10-CM | POA: Insufficient documentation

## 2017-07-08 DIAGNOSIS — K219 Gastro-esophageal reflux disease without esophagitis: Secondary | ICD-10-CM | POA: Insufficient documentation

## 2017-07-08 DIAGNOSIS — G8929 Other chronic pain: Secondary | ICD-10-CM | POA: Insufficient documentation

## 2017-07-08 NOTE — Progress Notes (Signed)
Radiation Oncology         [phone] ________________________________  Name: Julian Pineda MRN: [id number]  Date: 07/08/2017  DOB: [birth year]  Follow-Up Visit Note  CC: Julian Haven, MD  [name], MD  Diagnosis:   71 y.o. gentleman with stage T1c adenocarcinoma of the prostate with a Gleason's score of 3+4 and a PSA of 7.0     ICD-10-CM   1. Malignant neoplasm of prostate ([facility]) C61     Interval Since Last Radiation:  3 weeks  Narrative:  The patient returns today for routine follow-up.  He is complaining of increased urinary frequency, urgency and mild urinary hesitation symptoms. He filled out a questionnaire regarding urinary function today providing and overall IPSS score of 10 characterizing his symptoms as moderate.  His pre-implant score was 5. He denies any bowel symptoms.  He denies any pelvic or abdominal pain and reports that the initial dysuria that he experienced resolved within about 24 hours of the procedure. He denies gross hematuria, urinary leakage or incontinence. He is scheduled to follow up with Dr. Junious Silk on November 14th and will have his PSA drawn on November 4th.  ALLERGIES:  is allergic to penicillins.  Meds: Current Outpatient Prescriptions  Medication Sig Dispense Refill  . cholecalciferol (VITAMIN D) 1000 units tablet Take 1,000 Units by mouth every morning.     . folic acid (FOLVITE) 1 MG tablet TAKE 1 TABLET EVERY DAY (Patient taking differently: TAKE 1 TABLET EVERY DAY--- takes in am) 90 tablet 0  . losartan (COZAAR) 100 MG tablet TAKE 1 TABLET EVERY DAY (Patient taking differently: TAKE 1 TABLET EVERY DAY--- takes in am) 90 tablet 2  . meloxicam (MOBIC) 15 MG tablet TAKE 1 TABLET BY MOUTH ONCE DAILY WITH FOOD (Patient taking differently: TAKE 1 TABLET BY MOUTH ONCE DAILY WITH FOOD--- takes in am) 30 tablet 2  . methotrexate 50 MG/2ML injection INJECT 1ML WEEKLY (Patient not taking: Reported on 06/30/2017) 4 mL 6  . tamsulosin (FLOMAX)  0.4 MG CAPS capsule Take 1 capsule (0.4 mg total) by mouth daily after supper. 30 capsule 0   No current facility-administered medications for this encounter.     Physical Findings:  weight is 204 lb 9.6 oz (92.8 kg). His blood pressure is 155/82 (abnormal) and his pulse is 78. His respiration is 16 and oxygen saturation is 100%.   In general this is a well appearing caucasian male in no acute distress. He's alert and oriented x4 and appropriate throughout the examination. Cardiopulmonary assessment is negative for acute distress and he exhibits normal effort.   Lab Findings: Lab Results  Component Value Date   WBC 5.6 06/10/2017   HGB 15.0 06/10/2017   HCT 43.2 06/10/2017   MCV 96.6 06/10/2017   PLT 222 06/10/2017    Radiographic Findings:  The patient underwent CT imaging in our clinic for post implant dosimetry. Dr. [name] personally reviewed the CT images which appear to demonstrate an adequate distribution of radioactive seeds throughout the prostate gland. There are no seeds in or near the rectum. We suspect the final radiation plan and dosimetry will show appropriate coverage of the prostate gland.   Impression: The patient is recovering from the effects of radiation. His urinary symptoms should gradually improve over the next 4-6 months. We talked about this today. He is encouraged by his improvement already and is otherwise pleased with his outcome.   Plan: Today, we spent time talking to the patient about his prostate seed  implant and resolving urinary symptoms. We also talked about long-term follow-up for prostate cancer following seed implant. He understands that ongoing PSA determinations and digital rectal exams will help perform surveillance to rule out disease recurrence. He has a follow up appointment with Dr. Junious Silk on 09/14/17.  He understands what to expect with regard to his PSA measures. We reviewed the importance of radiation safety, particularly around young  children and pregnant females.  Patient was also educated today about some of the long-term effects from radiation including a small risk for rectal bleeding and possibly erectile dysfunction. We talked about some of the general management approaches to these potential complications. However, I did encourage the patient to contact our office or return at any point if he has questions or concerns related to his previous radiation and prostate cancer.   Nicholos Johns, PA-C    Tyler Pita, MD  [facility] Direct Dial: [phone]  Fax: [phone] [email or web address]  Skype  LinkedIn    Page Me     This document serves as a record of services personally performed by Tyler Pita, MD and Freeman Caldron, PA-C. It was created on their behalf by Arlyce Harman, a trained medical scribe. The creation of this record is based on the scribe's personal observations and the provider's statements to them. This document has been checked and approved by the attending provider.

## 2017-07-08 NOTE — Progress Notes (Signed)
Julian Pineda doing very well post seed implant 06/17/17. He states post procedure he had dysuria but that resolved. He continues to have increased urgency and frequency but states nothing he cannot live with. He has follow up appointment with Dr. Junious Silk November 4th.

## 2017-07-08 NOTE — Progress Notes (Signed)
Weight stable. BP elevated. Denies pain. Reports dysuria resolved. Denies hematuria. Denies urinary leakage or incontinence. Pre seed IPSS 5 and post seed IPSS 10. Scheduled to follow up with [facility] November 14th.   BP (!) 155/82 (BP Location: Left Arm, Patient Position: Sitting, Cuff Size: Normal)   Pulse 78   Resp 16   Wt 204 lb 9.6 oz (92.8 kg)   SpO2 100%   BMI 29.36 kg/m   Wt Readings from Last 3 Encounters:  07/08/17 204 lb 9.6 oz (92.8 kg)  06/17/17 200 lb 8 oz (90.9 kg)  06/01/17 202 lb 8 oz (91.9 kg)

## 2017-07-13 DIAGNOSIS — D045 Carcinoma in situ of skin of trunk: Secondary | ICD-10-CM | POA: Diagnosis not present

## 2017-07-28 ENCOUNTER — Ambulatory Visit (INDEPENDENT_AMBULATORY_CARE_PROVIDER_SITE_OTHER): Payer: PPO | Admitting: Family Medicine

## 2017-07-28 VITALS — BP 128/72 | HR 74 | Temp 98.6°F | Resp 16 | Wt 206.8 lb

## 2017-07-28 DIAGNOSIS — C61 Malignant neoplasm of prostate: Secondary | ICD-10-CM | POA: Diagnosis not present

## 2017-07-28 DIAGNOSIS — K625 Hemorrhage of anus and rectum: Secondary | ICD-10-CM | POA: Diagnosis not present

## 2017-07-28 DIAGNOSIS — M25711 Osteophyte, right shoulder: Secondary | ICD-10-CM | POA: Diagnosis not present

## 2017-07-28 DIAGNOSIS — C44319 Basal cell carcinoma of skin of other parts of face: Secondary | ICD-10-CM | POA: Diagnosis not present

## 2017-07-28 DIAGNOSIS — M75121 Complete rotator cuff tear or rupture of right shoulder, not specified as traumatic: Secondary | ICD-10-CM | POA: Diagnosis not present

## 2017-07-28 DIAGNOSIS — Z1211 Encounter for screening for malignant neoplasm of colon: Secondary | ICD-10-CM

## 2017-07-28 DIAGNOSIS — C4491 Basal cell carcinoma of skin, unspecified: Secondary | ICD-10-CM | POA: Insufficient documentation

## 2017-07-28 DIAGNOSIS — M67911 Unspecified disorder of synovium and tendon, right shoulder: Secondary | ICD-10-CM | POA: Diagnosis not present

## 2017-07-28 DIAGNOSIS — M7581 Other shoulder lesions, right shoulder: Secondary | ICD-10-CM | POA: Diagnosis not present

## 2017-07-28 DIAGNOSIS — M25811 Other specified joint disorders, right shoulder: Secondary | ICD-10-CM | POA: Diagnosis not present

## 2017-07-28 DIAGNOSIS — I10 Essential (primary) hypertension: Secondary | ICD-10-CM

## 2017-07-28 DIAGNOSIS — H00015 Hordeolum externum left lower eyelid: Secondary | ICD-10-CM

## 2017-07-28 DIAGNOSIS — M24811 Other specific joint derangements of right shoulder, not elsewhere classified: Secondary | ICD-10-CM | POA: Diagnosis not present

## 2017-07-28 DIAGNOSIS — M25411 Effusion, right shoulder: Secondary | ICD-10-CM | POA: Diagnosis not present

## 2017-07-28 DIAGNOSIS — M24111 Other articular cartilage disorders, right shoulder: Secondary | ICD-10-CM | POA: Diagnosis not present

## 2017-07-28 DIAGNOSIS — M66821 Spontaneous rupture of other tendons, right upper arm: Secondary | ICD-10-CM | POA: Diagnosis not present

## 2017-07-28 NOTE — Progress Notes (Signed)
  Tommi Rumps, MD Phone: [phone]  Julian Pineda is a 71 y.o. male who presents today for follow-up.  Hypertension: Not checking. Taking losartan. Chest pain, shortness breath, or edema.  He's been treated for prostate cancer with brachytherapy. He did great for a while though over the last week or 2 he has had more urinary frequency. Occasionally difficult to urinate though he does feel like he empties his bladder. He is also having some more frequent and loose stools. Notes will go have a bowel movement and then feels that he needs to go back to have a bowel movement. He did note some minimal bleeding just wiped this morning voice had multiple bowel movements since without any issues. Reports it has been more than 10 years since his last colonoscopy.  Patient's stye previously resolved though he subsequently was found to have a basal cell near this area. He saw dermatology and they cut it out. To follow with them.  PMH: nonsmoker.   ROS see history of present illness  Objective  Physical Exam Vitals:   07/28/17 0807 07/28/17 0824  BP: 136/74 128/72  Pulse: 74   Resp: 16   Temp: 98.6 F (37 C)   SpO2: 98%     BP Readings from Last 3 Encounters:  07/28/17 128/72  07/08/17 (!) 155/82  06/17/17 125/67   Wt Readings from Last 3 Encounters:  07/28/17 206 lb 12.8 oz (93.8 kg)  07/08/17 204 lb 9.6 oz (92.8 kg)  06/17/17 200 lb 8 oz (90.9 kg)    Physical Exam  Constitutional: No distress.  Cardiovascular: Normal rate, regular rhythm and normal heart sounds.   Pulmonary/Chest: Effort normal and breath sounds normal.  Abdominal: Soft. Bowel sounds are normal. He exhibits no distension. There is no tenderness. There is no rebound and no guarding.  Neurological: He is alert. Gait normal.  Skin: Skin is warm and dry. He is not diaphoretic.     Assessment/Plan: Please see individual problem list.  Hypertension At goal. Continue current medication.  Malignant  neoplasm of prostate [facility]) Currently undergoing brachytherapy. This started to have some urinary and stool issues with this. Discussed having him see his urologist for follow-up of these issues. Slight bleeding when he wiped earlier today. We'll refer for colonoscopy given that he is due. Offered exam though he deferred. He will see his urologist for follow-up. He'll continue to monitor this issue.   Stye Resolved though found to have basal cell carcinoma near the site. He'll continue to follow with dermatology.  Basal cell carcinoma Continue to follow with dermatology.   Tommi Rumps, MD [place]

## 2017-07-28 NOTE — Assessment & Plan Note (Addendum)
Currently undergoing brachytherapy. This started to have some urinary and stool issues with this. Discussed having him see his urologist for follow-up of these issues. Slight bleeding when he wiped earlier today. We'll refer for colonoscopy given that he is due. Offered exam though he deferred. He will see his urologist for follow-up. He'll continue to monitor this issue.

## 2017-07-28 NOTE — Assessment & Plan Note (Signed)
Resolved though found to have basal cell carcinoma near the site. He'll continue to follow with dermatology.

## 2017-07-28 NOTE — Assessment & Plan Note (Signed)
Continue to follow with dermatology.

## 2017-07-28 NOTE — Patient Instructions (Signed)
Nice to see you. Please continue your current blood pressure medication. Please follow up with your urologist regarding your urinary symptoms and stool symptoms. If these things worsen please contact them sooner.

## 2017-07-28 NOTE — Assessment & Plan Note (Signed)
At goal. Continue current medication. 

## 2017-07-29 ENCOUNTER — Encounter: Payer: Self-pay | Admitting: Radiation Oncology

## 2017-07-29 DIAGNOSIS — Z510 Encounter for antineoplastic radiation therapy: Secondary | ICD-10-CM | POA: Diagnosis not present

## 2017-07-29 DIAGNOSIS — C61 Malignant neoplasm of prostate: Secondary | ICD-10-CM | POA: Diagnosis not present

## 2017-08-01 NOTE — Progress Notes (Signed)
  Radiation Oncology         [phone]) [phone] ________________________________  Name: JODIE LEINER MRN: [id number]  Date: 07/29/2017  DOB: 24-Dec-1945  3D Planning Note   Prostate Brachytherapy Post-Implant Dosimetry  Diagnosis: 71 y.o. gentleman with stage T1c adenocarcinoma of the prostate with a Gleason's score of 3+4 and a PSA of 7.0  Narrative: On a previous date, SYRE KNERR returned following prostate seed implantation for post implant planning. He underwent CT scan complex simulation to delineate the three-dimensional structures of the pelvis and demonstrate the radiation distribution.  Since that time, the seed localization, and complex isodose planning with dose volume histograms have now been completed.  Results:   Prostate Coverage - The dose of radiation delivered to the 90% or more of the prostate gland (D90) was 111.19% of the prescription dose. This exceeds our goal of greater than 90%. Rectal Sparing - The volume of rectal tissue receiving the prescription dose or higher was 0.0 cc. This falls under our thresholds tolerance of 1.0 cc.  Impression: The prostate seed implant appears to show adequate target coverage and appropriate rectal sparing.  Plan:  The patient will continue to follow with urology for ongoing PSA determinations. I would anticipate a high likelihood for local tumor control with minimal risk for rectal morbidity.  ________________________________  Sheral Apley [name], M.D.

## 2017-08-02 DIAGNOSIS — M25511 Pain in right shoulder: Secondary | ICD-10-CM | POA: Diagnosis not present

## 2017-08-03 DIAGNOSIS — Z1211 Encounter for screening for malignant neoplasm of colon: Secondary | ICD-10-CM | POA: Diagnosis not present

## 2017-08-03 DIAGNOSIS — I10 Essential (primary) hypertension: Secondary | ICD-10-CM | POA: Diagnosis not present

## 2017-09-14 ENCOUNTER — Other Ambulatory Visit: Payer: PPO

## 2017-09-14 DIAGNOSIS — C61 Malignant neoplasm of prostate: Secondary | ICD-10-CM

## 2017-09-15 LAB — PSA: Prostate Specific Ag, Serum: 2.3 ng/mL (ref 0.0–4.0)

## 2017-09-19 ENCOUNTER — Ambulatory Visit (INDEPENDENT_AMBULATORY_CARE_PROVIDER_SITE_OTHER): Payer: PPO

## 2017-09-19 ENCOUNTER — Ambulatory Visit (INDEPENDENT_AMBULATORY_CARE_PROVIDER_SITE_OTHER): Payer: PPO | Admitting: Urology

## 2017-09-19 ENCOUNTER — Encounter: Payer: Self-pay | Admitting: Urology

## 2017-09-19 VITALS — BP 136/80 | HR 69 | Temp 98.6°F | Resp 14 | Ht 69.5 in | Wt 206.8 lb

## 2017-09-19 VITALS — BP 167/85 | HR 65 | Ht 69.5 in | Wt 202.2 lb

## 2017-09-19 DIAGNOSIS — C61 Malignant neoplasm of prostate: Secondary | ICD-10-CM

## 2017-09-19 DIAGNOSIS — Z0000 Encounter for general adult medical examination without abnormal findings: Secondary | ICD-10-CM

## 2017-09-19 DIAGNOSIS — R350 Frequency of micturition: Secondary | ICD-10-CM | POA: Diagnosis not present

## 2017-09-19 LAB — BLADDER SCAN AMB NON-IMAGING: SCAN RESULT: 0

## 2017-09-19 NOTE — Progress Notes (Signed)
Subjective:   Julian Pineda is a 71 y.o. male who presents for an Initial Medicare Annual Wellness Visit.  Review of Systems  No ROS.  Medicare Wellness Visit. Additional risk factors are reflected in the social history.  Cardiac Risk Factors include: hypertension;advanced age (>79mn, >>28women)    Objective:    Today's Vitals   09/19/17 0841  BP: 136/80  Pulse: 69  Resp: 14  Temp: 98.6 F (37 C)  TempSrc: Oral  SpO2: 96%  Weight: 206 lb 12.8 oz (93.8 kg)  Height: 5' 9.5" (1.765 m)   Body mass index is 30.1 kg/m.  Current Medications (verified) Outpatient Encounter Medications as of 09/19/2017  Medication Sig  . cholecalciferol (VITAMIN D) 1000 units tablet Take 1,000 Units by mouth every morning.   . folic acid (FOLVITE) 1 MG tablet TAKE 1 TABLET EVERY DAY (Patient taking differently: TAKE 1 TABLET EVERY DAY--- takes in am)  . losartan (COZAAR) 100 MG tablet TAKE 1 TABLET EVERY DAY (Patient taking differently: TAKE 1 TABLET EVERY DAY--- takes in am)  . meloxicam (MOBIC) 15 MG tablet TAKE 1 TABLET BY MOUTH ONCE DAILY WITH FOOD (Patient taking differently: TAKE 1 TABLET BY MOUTH ONCE DAILY WITH FOOD--- takes in am)  . methotrexate 50 MG/2ML injection INJECT 1ML WEEKLY  . tamsulosin (FLOMAX) 0.4 MG CAPS capsule Take 1 capsule (0.4 mg total) by mouth daily after supper.   No facility-administered encounter medications on file as of 09/19/2017.     Allergies (verified) Penicillins   History: Past Medical History:  Diagnosis Date  . Diverticulosis of colon   . GERD (gastroesophageal reflux disease)    "occas"  . History of basal cell carcinoma (BCC) excision    09-25-2014  right lower eyelid s/p moh's dx /  left lower eyelid scheduled removal 07-05-2017  . History of squamous cell carcinoma excision    01/ 2015  left lateral cheek  s/p  moh's sx  . Hypertension    sees Dr. JJackalyn Lombard [place]  . Nocturia   . OA (osteoarthritis)   . Prostate cancer  ([facility] urologist-  dr eskidge/  oncologist- dr mTammi Klippel  dx 04/ 2018-- Stage T1c, Gleason 3+3,  PSA 7.1,  vol 38.6cc  . Rheumatoid arteritis    rheumotologist-   dr bElpidio Galea . Rosacea    Dr. PSharlett Iles . Vitamin D deficiency    Past Surgical History:  Procedure Laterality Date  . ACHILLES TENDON SURGERY Right 1984  . COLONOSCOPY  2008  . MOHS SURGERY  09-25-2014;  11-27-2013   [facility]   09-25-2014 right lower eyelid /  11-27-2013  left lateral cheek  . RADIOACTIVE SEED IMPLANT/BRACHYTHERAPY IMPLANT N/A 06/17/2017   [facility] . TONSILLECTOMY  child  . TOTAL KNEE ARTHROPLASTY Left 12/04/2012   [facility] . VARICOSE VEIN SURGERY  2000   left leg   Family History  Problem Relation Age of Onset  . Cancer Mother        Multiple Myeloma  . Alcohol abuse Father   . Prostate cancer Brother   . Breast cancer Daughter   . Hematuria Neg Hx   . Sickle cell anemia Neg Hx   . Kidney cancer Neg Hx   . Bladder Cancer Neg Hx    Social History   Occupational History  . Not on file  Tobacco Use  . Smoking status: Never Smoker  .  Smokeless tobacco: Current User    Types: Chew  Substance and Sexual Activity  . Alcohol use: Yes    Alcohol/week: 8.4 oz    Types: 14 Shots of liquor per week    Comment: 2 per day  . Drug use: No  . Sexual activity: Yes   Tobacco Counseling Ready to quit: Not Answered Counseling given: Not Answered   Activities of Daily Living In your present state of health, do you have any difficulty performing the following activities: 09/19/2017 06/17/2017  Hearing? N N  Vision? N N  Difficulty concentrating or making decisions? N N  Walking or climbing stairs? N N  Dressing or bathing? N N  Doing errands, shopping? N -  Preparing Food and eating ? N -  Using the Toilet? N -  In the past six months, have you accidently leaked urine? N -  Do you have problems with loss of bowel  control? N -  Managing your Medications? N -  Managing your Finances? N -  Housekeeping or managing your Housekeeping? N -  Some recent data might be hidden    Immunizations and Health Maintenance Immunization History  Administered Date(s) Administered  . DTaP 02/19/2010  . Tdap 08/12/2015   Health Maintenance Due  Topic Date Due  . PNA vac Low Risk Adult (1 of 2 - PCV13) 01/15/2013  . COLONOSCOPY  01/31/2017    Patient Care Team: Leone Haven, MD as PCP - General (Family Medicine)  Indicate any recent Medical Services you may have received from other than [facility] providers in the past year (date may be approximate).    Assessment:   This is a routine wellness examination for Julian Pineda. The goal of the wellness visit is to assist the patient how to close the gaps in care and create a preventative care plan for the patient.   The roster of all physicians providing medical care to patient is listed in the Snapshot section of the chart.  Taking calcium VIT D as appropriate/Osteoporosis risk reviewed.    Safety issues reviewed; Smoke and carbon monoxide detectors in the home. Firearms locked up in the home. Wears seatbelts when driving or riding with others. Patient does wear sunscreen or protective clothing when in direct sunlight. No violence in the home.  Depression- PHQ 2 &9 complete.  No signs/symptoms or verbal communication regarding little pleasure in doing things, feeling down, depressed or hopeless. No changes in sleeping, energy, eating, concentrating.  No thoughts of self harm or harm towards others.  Time spent on this topic is 8 minutes.   Patient is alert, normal appearance, oriented to person/place/and time. Correctly identified the president of the [place], recall of 3/3 words, and performing simple calculations. Displays appropriate judgement and can read correct time from watch face.   No new identified risk were noted.  No failures at ADL's or IADL's.   BMI-  discussed the importance of a healthy diet, water intake and the benefits of aerobic exercise. Educational material provided.   24 hour diet recall: Low carb foods  Daily fluid intake: 1 cups of caffeine, 3 cups of water  Dental- every 6 months.  Dr. Norman Herrlich.  Eye- Visual acuity not assessed per patient preference since they have regular follow up with the ophthalmologist.  Wears corrective lenses.  Sleep patterns- Sleeps 8 hours at night.  Wakes feeling rested. Naps duri ng the day.  Prevnar 13 vaccine declined.  Patient Concerns: None at this time. Follow up with PCP as  needed.  Hearing/Vision screen Hearing Screening Comments: Patient is able to hear conversational tones without difficulty.  No issues reported.   Vision Screening Comments: Wears corrective lenses Last OV 03/2017 Visual acuity not assessed per patient preference since they have regular follow up with the ophthalmologist  Dietary issues and exercise activities discussed: Current Exercise Habits: Home exercise routine, Type of exercise: calisthenics;strength training/weights;stretching, Frequency (Times/Week): 3, Intensity: Mild  Goals    . Increase physical activity     Add aerobic exercise to your regimen    . INCREASE WATER INTAKE     Stay hydrated      Depression Screen PHQ 2/9 Scores 09/19/2017 03/23/2017 02/08/2017 10/17/2015  PHQ - 2 Score 0 0 0 0  PHQ- 9 Score 0 - - -    Fall Risk Fall Risk  09/19/2017 03/23/2017 02/08/2017 10/17/2015  Falls in the past year? No No No No    Cognitive Function: MMSE - Mini Mental State Exam 09/19/2017  Orientation to time 5  Orientation to Place 5  Registration 3  Attention/ Calculation 5  Recall 3  Language- name 2 objects 2  Language- repeat 1  Language- follow 3 step command 3  Language- read & follow direction 1  Write a sentence 1  Copy design 1  Total score 30        Screening Tests Health Maintenance  Topic Date Due  . PNA vac Low Risk  Adult (1 of 2 - PCV13) 01/15/2013  . COLONOSCOPY  01/31/2017  . INFLUENZA VACCINE  01/25/2018 (Originally 06/01/2017)  . TETANUS/TDAP  08/11/2025  . Hepatitis C Screening  Completed        Plan:    End of life planning; Advance aging; Advanced directives discussed. Copy of current HCPOA/Living Will requested.    I have personally reviewed and noted the following in the patient's chart:   . Medical and social history . Use of alcohol, tobacco or illicit drugs  . Current medications and supplements . Functional ability and status . Nutritional status . Physical activity . Advanced directives . List of other physicians . Hospitalizations, surgeries, and ER visits in previous 12 months . Vitals . Screenings to include cognitive, depression, and falls . Referrals and appointments  In addition, I have reviewed and discussed with patient certain preventive protocols, quality metrics, and best practice recommendations. A written personalized care plan for preventive services as well as general preventive health recommendations were provided to patient.     Varney Biles, LPN   [phone]    Reviewed above information.  Agree with assessment and plan.    Dr Nicki Reaper

## 2017-09-19 NOTE — Patient Instructions (Addendum)
  Mr. Julian Pineda , Thank you for taking time to come for your Medicare Wellness Visit. I appreciate your ongoing commitment to your health goals. Please review the following plan we discussed and let me know if I can assist you in the future.   These are the goals we discussed: Goals    . Increase physical activity     Add aerobic exercise to your regimen    . INCREASE WATER INTAKE     Stay hydrated       This is a list of the screening recommended for you and due dates:  Health Maintenance  Topic Date Due  . Pneumonia vaccines (1 of 2 - PCV13) 01/15/2013  . Colon Cancer Screening  01/31/2017  . Flu Shot  01/25/2018*  . Tetanus Vaccine  08/11/2025  .  Hepatitis C: One time screening is recommended by Center for Disease Control  (CDC) for  adults born from [birth year] through 1963.   Completed  *Topic was postponed. The date shown is not the original due date.

## 2017-09-19 NOTE — Progress Notes (Signed)
09/19/2017 9:58 AM   Julian Pineda [birth year] [id number]  Referring provider: Leone Haven, MD [address] [place], [place] [address]  No chief complaint on file.   HPI:  F/u -   1) PCa - pt underwent brachytherapy Jun 17, 2017 with Dr. [name] and Dr. 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  Biopsy and staging: TNM stage: cT1c Nx Mx  PSA: 7.1  Gleason score: 3+3=6 (read as 3+4=7 in one core by Dr. [name])  Biopsy (02/28/17, read by Dr. Elayne Guerin - [facility]): 7/13 cores positive  Left: L mid (3%, 3+3=6), L lateral base (20%, 3+3=6), L base (30%, 3+3=6)  Right: R apex (97%, 3+3=6, PNI), R lateral apex (66^, 3+3=6, PNI), R lateral mid (76%, 3+3=6, 2/2 cores) - Dr. [name] read one core in the right apex as 3+4=7  Prostate volume: 33.4 cc  PSAD: 0.21   Urinary function: IPSS is 5.  Erectile function: SHIM score is 25.   Imaging studies: 01/19/17- MRI - Findings as mentioned above with a question of EPE at the R apex but no LAD or SVI.   Family history: Brother who is undergoing XRT by Dr. [name].    Pt returns today and is doing well. He's had frequency and nocturia since the procedure. His PSA dropped to 2.3. He continues tamsulosin. No gross hematuria. Occasional feeling incomplete emptying and urgency. PVR = 0 ml.     PMH: Past Medical History:  Diagnosis Date  . Diverticulosis of colon   . GERD (gastroesophageal reflux disease)    "occas"  . History of basal cell carcinoma (BCC) excision    09-25-2014  right lower eyelid s/p moh's dx /  left lower eyelid scheduled removal 07-05-2017  . History of squamous cell carcinoma excision    01/ 2015  left lateral cheek  s/p  moh's sx  . Hypertension    sees Dr. Jackalyn Lombard, [place]  . Nocturia   . OA (osteoarthritis)   . Prostate cancer [facility]) urologist-  dr eskidge/  oncologist- dr [name]   dx 04/ 2018-- Stage T1c, Gleason 3+3,  PSA 7.1,  vol 38.6cc  . Rheumatoid arteritis    rheumotologist-   dr Elpidio Galea  . Rosacea    Dr. Sharlett Iles  . Vitamin D deficiency     Surgical History: Past Surgical History:  Procedure Laterality Date  . ACHILLES TENDON SURGERY Right 1984  . COLONOSCOPY  2008  . MOHS SURGERY  09-25-2014;  11-27-2013   [facility]   09-25-2014 right lower eyelid /  11-27-2013  left lateral cheek  . RADIOACTIVE SEED IMPLANT/BRACHYTHERAPY IMPLANT N/A 06/17/2017   Performed by Festus Aloe, MD at [facility]  . TONSILLECTOMY  child  . TOTAL KNEE ARTHROPLASTY Left 12/04/2012   Performed by Vickey Huger, MD at [place]  . VARICOSE VEIN SURGERY  2000   left leg    Home Medications:  Allergies as of 09/19/2017      Reactions   Penicillins Other (See Comments)   Unknown childhood reaction      Medication List        Accurate as of 09/19/17  9:58 AM. Always use your most recent med list.          cholecalciferol 1000 units tablet Commonly known as:  VITAMIN D Take 1,000 Units by mouth every morning.   folic acid 1 MG tablet Commonly known as:  FOLVITE TAKE 1 TABLET EVERY DAY   losartan 100  MG tablet Commonly known as:  COZAAR TAKE 1 TABLET EVERY DAY   meloxicam 15 MG tablet Commonly known as:  MOBIC TAKE 1 TABLET BY MOUTH ONCE DAILY WITH FOOD   methotrexate 50 MG/2ML injection INJECT 1ML WEEKLY   tamsulosin 0.4 MG Caps capsule Commonly known as:  FLOMAX Take 1 capsule (0.4 mg total) by mouth daily after supper.       Allergies:  Allergies  Allergen Reactions  . Penicillins Other (See Comments)    Unknown childhood reaction    Family History: Family History  Problem Relation Age of Onset  . Cancer Mother        Multiple Myeloma  . Alcohol abuse Father   . Prostate cancer Brother   . Breast cancer Daughter   . Hematuria Neg Hx   . Sickle cell anemia Neg Hx   . Kidney cancer Neg Hx   . Bladder Cancer Neg Hx     Social History:  reports that  has never smoked. His smokeless tobacco use includes chew. He  reports that he drinks about 8.4 oz of alcohol per week. He reports that he does not use drugs.  ROS: UROLOGY Frequent Urination?: Yes Hard to postpone urination?: No Burning/pain with urination?: No Get up at night to urinate?: Yes Leakage of urine?: No Urine stream starts and stops?: No Trouble starting stream?: No Do you have to strain to urinate?: Yes Blood in urine?: No Urinary tract infection?: No Sexually transmitted disease?: No Injury to kidneys or bladder?: No Painful intercourse?: No Weak stream?: No Erection problems?: No Penile pain?: No  Gastrointestinal Nausea?: No Vomiting?: No Indigestion/heartburn?: No Diarrhea?: No Constipation?: No  Constitutional Fever: No Night sweats?: No Weight loss?: No Fatigue?: No  Skin Skin rash/lesions?: No Itching?: No  Eyes Blurred vision?: No Double vision?: No  Ears/Nose/Throat Sore throat?: No Sinus problems?: No  Hematologic/Lymphatic Swollen glands?: No Easy bruising?: No  Cardiovascular Leg swelling?: No Chest pain?: No  Respiratory Cough?: No Shortness of breath?: No  Endocrine Excessive thirst?: No  Musculoskeletal Back pain?: No Joint pain?: No  Neurological Headaches?: No Dizziness?: No  Psychologic Depression?: No Anxiety?: No  Physical Exam: BP (!) 167/85 (BP Location: Right Arm, Patient Position: Sitting, Cuff Size: Normal)   Pulse 65   Ht 5' 9.5" (1.765 m)   Wt 91.7 kg (202 lb 3.2 oz)   BMI 29.43 kg/m   Constitutional:  Alert and oriented, No acute distress. HEENT: [place] AT, moist mucus membranes.  Trachea midline, no masses. Cardiovascular: No clubbing, cyanosis, or edema. Respiratory: Normal respiratory effort, no increased work of breathing. GI: Abdomen is soft, nontender, nondistended, no abdominal masses Skin: No rashes, bruises or suspicious lesions. Neurologic: Grossly intact, no focal deficits, moving all 4 extremities. Psychiatric: Normal mood and  affect.  Laboratory Data: Lab Results  Component Value Date   WBC 5.6 06/10/2017   HGB 15.0 06/10/2017   HCT 43.2 06/10/2017   MCV 96.6 06/10/2017   PLT 222 06/10/2017    Lab Results  Component Value Date   CREATININE 1.25 (H) 06/10/2017    Lab Results  Component Value Date   PSA1 2.3 09/14/2017   PSA1 7.0 (H) 02/28/2017   PSA1 7.1 (H) 11/29/2016    No results found for: TESTOSTERONE  No results found for: HGBA1C  Urinalysis Lab Results  Component Value Date   SPECGRAV 1.020 06/01/2017   PHUR 6.0 06/01/2017   COLORU Yellow 06/01/2017   APPEARANCEUR Clear 06/01/2017   LEUKOCYTESUR Negative 06/01/2017  PROTEINUR Negative 06/01/2017   GLUCOSEU Negative 06/01/2017   KETONESU Negative 06/01/2017   RBCU Negative 06/01/2017   BILIRUBINUR Negative 06/01/2017   UUROB 0.2 06/01/2017   NITRITE Negative 06/01/2017    Lab Results  Component Value Date   LABMICR See below: 06/01/2017   WBCUA None seen 06/01/2017   RBCUA None seen 06/01/2017   LABEPIT 0-10 06/01/2017   MUCUS Present (A) 11/29/2016   BACTERIA None seen 06/01/2017    No results found for this or any previous visit. No results found for this or any previous visit. No results found for this or any previous visit. No results found for this or any previous visit. No results found for this or any previous visit. No results found for this or any previous visit. No results found for this or any previous visit. No results found for this or any previous visit.  Assessment & Plan:   1. Frequent urination Continue tamsulosin. - Bladder Scan (Post Void Residual) in office  2. PCa - PSA lower. Check in 3 mo.    No Follow-up on file.  Festus Aloe, MD  [facility] [address] [place], [place] [address] [phone]

## 2017-09-30 DIAGNOSIS — M0589 Other rheumatoid arthritis with rheumatoid factor of multiple sites: Secondary | ICD-10-CM | POA: Diagnosis not present

## 2017-09-30 DIAGNOSIS — Z79899 Other long term (current) drug therapy: Secondary | ICD-10-CM | POA: Diagnosis not present

## 2017-09-30 DIAGNOSIS — R5382 Chronic fatigue, unspecified: Secondary | ICD-10-CM | POA: Diagnosis not present

## 2017-09-30 DIAGNOSIS — E663 Overweight: Secondary | ICD-10-CM | POA: Diagnosis not present

## 2017-09-30 DIAGNOSIS — Z681 Body mass index (BMI) 19 or less, adult: Secondary | ICD-10-CM | POA: Diagnosis not present

## 2017-09-30 DIAGNOSIS — M7989 Other specified soft tissue disorders: Secondary | ICD-10-CM | POA: Diagnosis not present

## 2017-09-30 DIAGNOSIS — M19049 Primary osteoarthritis, unspecified hand: Secondary | ICD-10-CM | POA: Diagnosis not present

## 2017-10-14 DIAGNOSIS — D485 Neoplasm of uncertain behavior of skin: Secondary | ICD-10-CM | POA: Diagnosis not present

## 2017-10-14 DIAGNOSIS — D2272 Melanocytic nevi of left lower limb, including hip: Secondary | ICD-10-CM | POA: Diagnosis not present

## 2017-10-14 DIAGNOSIS — Z85828 Personal history of other malignant neoplasm of skin: Secondary | ICD-10-CM | POA: Diagnosis not present

## 2017-10-14 DIAGNOSIS — L570 Actinic keratosis: Secondary | ICD-10-CM | POA: Diagnosis not present

## 2017-10-14 DIAGNOSIS — X32XXXA Exposure to sunlight, initial encounter: Secondary | ICD-10-CM | POA: Diagnosis not present

## 2017-10-14 DIAGNOSIS — D225 Melanocytic nevi of trunk: Secondary | ICD-10-CM | POA: Diagnosis not present

## 2017-10-14 DIAGNOSIS — D2261 Melanocytic nevi of right upper limb, including shoulder: Secondary | ICD-10-CM | POA: Diagnosis not present

## 2017-10-14 DIAGNOSIS — C44319 Basal cell carcinoma of skin of other parts of face: Secondary | ICD-10-CM | POA: Diagnosis not present

## 2017-10-17 DIAGNOSIS — Z9889 Other specified postprocedural states: Secondary | ICD-10-CM | POA: Diagnosis not present

## 2017-10-17 DIAGNOSIS — L578 Other skin changes due to chronic exposure to nonionizing radiation: Secondary | ICD-10-CM | POA: Diagnosis not present

## 2017-10-17 DIAGNOSIS — Z85828 Personal history of other malignant neoplasm of skin: Secondary | ICD-10-CM | POA: Diagnosis not present

## 2017-11-08 ENCOUNTER — Encounter: Payer: Self-pay | Admitting: Emergency Medicine

## 2017-11-09 ENCOUNTER — Encounter: Payer: Self-pay | Admitting: Emergency Medicine

## 2017-11-09 ENCOUNTER — Encounter
Admission: RE | Disposition: A | Discharge status: Home / Self Care | Payer: Self-pay | Source: Ambulatory Visit | Attending: Unknown Physician Specialty

## 2017-11-09 ENCOUNTER — Ambulatory Visit: Payer: PPO | Admitting: Anesthesiology

## 2017-11-09 ENCOUNTER — Ambulatory Visit
Admission: RE | Admit: 2017-11-09 | Discharge: 2017-11-09 | Disposition: A | Discharge status: Home / Self Care | Payer: PPO | Source: Ambulatory Visit | Attending: Unknown Physician Specialty | Admitting: Unknown Physician Specialty

## 2017-11-09 DIAGNOSIS — Z79899 Other long term (current) drug therapy: Secondary | ICD-10-CM | POA: Diagnosis not present

## 2017-11-09 DIAGNOSIS — L719 Rosacea, unspecified: Secondary | ICD-10-CM | POA: Diagnosis not present

## 2017-11-09 DIAGNOSIS — D122 Benign neoplasm of ascending colon: Secondary | ICD-10-CM | POA: Insufficient documentation

## 2017-11-09 DIAGNOSIS — K5790 Diverticulosis of intestine, part unspecified, without perforation or abscess without bleeding: Secondary | ICD-10-CM | POA: Diagnosis not present

## 2017-11-09 DIAGNOSIS — Z85828 Personal history of other malignant neoplasm of skin: Secondary | ICD-10-CM | POA: Diagnosis not present

## 2017-11-09 DIAGNOSIS — K219 Gastro-esophageal reflux disease without esophagitis: Secondary | ICD-10-CM | POA: Diagnosis not present

## 2017-11-09 DIAGNOSIS — K648 Other hemorrhoids: Secondary | ICD-10-CM | POA: Diagnosis not present

## 2017-11-09 DIAGNOSIS — K640 First degree hemorrhoids: Secondary | ICD-10-CM | POA: Diagnosis not present

## 2017-11-09 DIAGNOSIS — Z1211 Encounter for screening for malignant neoplasm of colon: Secondary | ICD-10-CM | POA: Diagnosis not present

## 2017-11-09 DIAGNOSIS — K5730 Diverticulosis of large intestine without perforation or abscess without bleeding: Secondary | ICD-10-CM | POA: Insufficient documentation

## 2017-11-09 DIAGNOSIS — Z8546 Personal history of malignant neoplasm of prostate: Secondary | ICD-10-CM | POA: Insufficient documentation

## 2017-11-09 DIAGNOSIS — Z87891 Personal history of nicotine dependence: Secondary | ICD-10-CM | POA: Insufficient documentation

## 2017-11-09 DIAGNOSIS — E559 Vitamin D deficiency, unspecified: Secondary | ICD-10-CM | POA: Insufficient documentation

## 2017-11-09 DIAGNOSIS — M1990 Unspecified osteoarthritis, unspecified site: Secondary | ICD-10-CM | POA: Diagnosis not present

## 2017-11-09 DIAGNOSIS — I10 Essential (primary) hypertension: Secondary | ICD-10-CM | POA: Insufficient documentation

## 2017-11-09 DIAGNOSIS — K635 Polyp of colon: Secondary | ICD-10-CM | POA: Diagnosis not present

## 2017-11-09 DIAGNOSIS — D123 Benign neoplasm of transverse colon: Secondary | ICD-10-CM | POA: Diagnosis not present

## 2017-11-09 DIAGNOSIS — D124 Benign neoplasm of descending colon: Secondary | ICD-10-CM | POA: Insufficient documentation

## 2017-11-09 HISTORY — PX: COLONOSCOPY WITH PROPOFOL: SHX5780

## 2017-11-09 LAB — HM COLONOSCOPY

## 2017-11-09 SURGERY — COLONOSCOPY WITH PROPOFOL
Anesthesia: General

## 2017-11-09 MED ORDER — EPHEDRINE SULFATE 50 MG/ML IJ SOLN
INTRAMUSCULAR | Status: DC | PRN
  Administered 2017-11-09 (×2): 10 mg via INTRAVENOUS

## 2017-11-09 MED ORDER — GENTAMICIN IN SALINE 1.6-0.9 MG/ML-% IV SOLN
80.0000 mg | Freq: Once | INTRAVENOUS | Status: AC
  Administered 2017-11-09: 80 mg via INTRAVENOUS
  Filled 2017-11-09: qty 50

## 2017-11-09 MED ORDER — SODIUM CHLORIDE 0.9 % IV SOLN: INTRAVENOUS | Status: DC

## 2017-11-09 MED ORDER — VANCOMYCIN HCL IN DEXTROSE 1-5 GM/200ML-% IV SOLN
1000.0000 mg | Freq: Once | INTRAVENOUS | Status: AC
  Administered 2017-11-09: 1000 mg via INTRAVENOUS

## 2017-11-09 MED ORDER — SODIUM CHLORIDE 0.9 % IV SOLN
INTRAVENOUS | Status: DC
  Administered 2017-11-09: 1000 mL via INTRAVENOUS
  Administered 2017-11-09: 10:00:00 via INTRAVENOUS

## 2017-11-09 MED ORDER — PROPOFOL 500 MG/50ML IV EMUL
INTRAVENOUS | Status: DC | PRN
  Administered 2017-11-09: 140 ug/kg/min via INTRAVENOUS

## 2017-11-09 MED ORDER — VANCOMYCIN HCL IN DEXTROSE 1-5 GM/200ML-% IV SOLN
INTRAVENOUS | Status: AC
  Filled 2017-11-09: qty 200

## 2017-11-09 MED ORDER — PROPOFOL 500 MG/50ML IV EMUL
INTRAVENOUS | Status: AC
  Filled 2017-11-09: qty 50

## 2017-11-09 MED ORDER — PROPOFOL 10 MG/ML IV BOLUS
INTRAVENOUS | Status: DC | PRN
  Administered 2017-11-09: 100 mg via INTRAVENOUS

## 2017-11-09 MED ORDER — PHENYLEPHRINE HCL 10 MG/ML IJ SOLN
INTRAMUSCULAR | Status: DC | PRN
  Administered 2017-11-09 (×2): 100 ug via INTRAVENOUS

## 2017-11-09 MED ORDER — LIDOCAINE 2% (20 MG/ML) 5 ML SYRINGE
INTRAMUSCULAR | Status: DC | PRN
  Administered 2017-11-09: 40 mg via INTRAVENOUS

## 2017-11-09 MED ORDER — FENTANYL CITRATE (PF) 100 MCG/2ML IJ SOLN
INTRAMUSCULAR | Status: DC | PRN
  Administered 2017-11-09: 50 ug via INTRAVENOUS

## 2017-11-09 MED ORDER — FENTANYL CITRATE (PF) 100 MCG/2ML IJ SOLN
INTRAMUSCULAR | Status: AC
  Filled 2017-11-09: qty 2

## 2017-11-09 NOTE — Anesthesia Postprocedure Evaluation (Signed)
Anesthesia Post Note  Patient: Julian Pineda  Procedure(s) Performed: COLONOSCOPY WITH PROPOFOL (N/A )  Patient location during evaluation: PACU Anesthesia Type: General Level of consciousness: awake and alert and oriented Pain management: pain level controlled Vital Signs Assessment: post-procedure vital signs reviewed and stable Respiratory status: spontaneous breathing Cardiovascular status: blood pressure returned to baseline Anesthetic complications: no     Last Vitals:  Vitals:   11/09/17 1023 11/09/17 1112  BP: (!) 88/57 118/72  Pulse: 82 68  Resp: 16 17  Temp: (!) 36.1 C   SpO2: 99% 100%    Last Pain:  Vitals:   11/09/17 1023  TempSrc: Tympanic                 Jiro Kiester

## 2017-11-09 NOTE — H&P (Signed)
Primary Care Physician:  Leone Haven, MD Primary Gastroenterologist:  Dr. Vira Agar  Pre-Procedure History & Physical: HPI:  Julian Pineda is a 72 y.o. male is here for an colonoscopy.   Past Medical History:  Diagnosis Date  . Diverticulosis of colon   . GERD (gastroesophageal reflux disease)    "occas"  . History of basal cell carcinoma (BCC) excision    09-25-2014  right lower eyelid s/p moh's dx /  left lower eyelid scheduled removal 07-05-2017  . History of squamous cell carcinoma excision    01/ 2015  left lateral cheek  s/p  moh's sx  . Hypertension    sees Dr. Jackalyn Lombard, [place]  . Nocturia   . OA (osteoarthritis)   . Prostate cancer [facility]) urologist-  dr eskidge/  oncologist- dr [name]   dx 04/ 2018-- Stage T1c, Gleason 3+3,  PSA 7.1,  vol 38.6cc  . Rheumatoid arteritis    rheumotologist-   dr Elpidio Galea  . Rosacea    Dr. Sharlett Iles  . Vitamin D deficiency     Past Surgical History:  Procedure Laterality Date  . ACHILLES TENDON SURGERY Right 1984  . COLONOSCOPY  2008  . MOHS SURGERY  09-25-2014;  11-27-2013   [facility]   09-25-2014 right lower eyelid /  11-27-2013  left lateral cheek  . RADIOACTIVE SEED IMPLANT N/A 06/17/2017   Procedure: RADIOACTIVE SEED IMPLANT/BRACHYTHERAPY IMPLANT;  Surgeon: Festus Aloe, MD;  Location: [facility];  Service: Urology;  Laterality: N/A;  . TONSILLECTOMY  child  . TOTAL KNEE ARTHROPLASTY  12/04/2012   Procedure: TOTAL KNEE ARTHROPLASTY;  Surgeon: Vickey Huger, MD;  Location: [facility];  Service: Orthopedics;  Laterality: Left;  left total knee arthroplasty  . VARICOSE VEIN SURGERY  2000   left leg    Prior to Admission medications   Medication Sig Start Date End Date Taking? Authorizing Provider  cholecalciferol (VITAMIN D) 1000 units tablet Take 1,000 Units by mouth every morning.    Yes [provider]  folic acid (FOLVITE) 1 MG tablet TAKE 1 TABLET EVERY DAY Patient taking differently: TAKE  1 TABLET EVERY DAY--- takes in am 09/15/15  Yes Jackolyn Confer, MD  losartan (COZAAR) 100 MG tablet TAKE 1 TABLET EVERY DAY Patient taking differently: TAKE 1 TABLET EVERY DAY--- takes in am 03/15/17  Yes Leone Haven, MD  meloxicam (MOBIC) 15 MG tablet TAKE 1 TABLET BY MOUTH ONCE DAILY WITH FOOD Patient taking differently: TAKE 1 TABLET BY MOUTH ONCE DAILY WITH FOOD--- takes in am 10/07/16  Yes Leone Haven, MD  methotrexate 50 MG/2ML injection INJECT 1ML WEEKLY 04/15/16  Yes Jackolyn Confer, MD  metroNIDAZOLE (METROCREAM) 0.75 % cream Apply topically 2 (two) times daily.   Yes [provider]  Multiple Vitamin (MULTIVITAMIN) tablet Take 1 tablet by mouth daily.   Yes [provider]  Polyethylene Glycol POWD Take 255 g by mouth once.   Yes [provider]  sulfamethoxazole-trimethoprim (BACTRIM DS) 800-160 MG tablet Take 1 tablet by mouth once. 11/09/17 11/09/17 Yes [provider]  tamsulosin (FLOMAX) 0.4 MG CAPS capsule Take 1 capsule (0.4 mg total) by mouth daily after supper. 06/17/17  Yes Festus Aloe, MD  clindamycin (CLEOCIN) 300 MG capsule Take 600 mg by mouth once. 11/09/17 11/09/17  [provider]    Allergies as of 11/03/2017 - Review Complete 09/19/2017  Allergen Reaction Noted  . Penicillins Other (See Comments) 06/06/2017    Family History  Problem Relation Age of Onset  . Cancer Mother        Multiple Myeloma  . Alcohol abuse Father   . Prostate cancer Brother   . Breast cancer Daughter   . Hematuria Neg Hx   . Sickle cell anemia Neg Hx   . Kidney cancer Neg Hx   . Bladder Cancer Neg Hx     Social History   Socioeconomic History  . Marital status: Married    Spouse name: Not on file  . Number of children: Not on file  . Years of education: Not on file  . Highest education level: Not on file  Social Needs  . Financial resource strain: Not on file  . Food insecurity - worry: Not on file  . Food  insecurity - inability: Not on file  . Transportation needs - medical: Not on file  . Transportation needs - non-medical: Not on file  Occupational History  . Not on file  Tobacco Use  . Smoking status: Never Smoker  . Smokeless tobacco: Former Systems developer    Types: Chew  Substance and Sexual Activity  . Alcohol use: Yes    Alcohol/week: 8.4 oz    Types: 14 Shots of liquor per week    Comment: 2 per day  . Drug use: No  . Sexual activity: Yes  Other Topics Concern  . 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 Treadmill 5 days a week   Likes to [employer]    Review of Systems: See HPI, otherwise negative ROS  Physical Exam: BP 116/70   Pulse 72   Temp (!) 96.9 F (36.1 C) (Tympanic)   Resp 20   Ht '5\' 10"'  (1.778 m)   Wt 90.7 kg (200 lb)   SpO2 100%   BMI 28.70 kg/m  General:   Alert,  pleasant and cooperative in NAD Head:  Normocephalic and atraumatic. Neck:  Supple; no masses or thyromegaly. Lungs:  Clear throughout to auscultation.    Heart:  Regular rate and rhythm. Abdomen:  Soft, nontender and nondistended. Normal bowel sounds, without guarding, and without rebound.   Neurologic:  Alert and  oriented x4;  grossly normal neurologically.  Impression/Plan: Julian Pineda is here for an colonoscopy to be performed for screening  Risks, benefits, limitations, and alternatives regarding  colonoscopy have been reviewed with the patient.  Questions have been answered.  All parties agreeable.   Gaylyn Cheers, MD  11/09/2017, 9:45 AM

## 2017-11-09 NOTE — Op Note (Signed)
[facility]astroenter[facility] Patient Name: Julian Pineda Procedure Date: 11/09/2017 9:39 AM MRN: [id number] Account #: [id number] Date of Birth: February 02, 1946 Admit Type: Outpatient Age: 72 Room: [facility] ENDO ROOM 4 Gender: Male Note Status: Finalized Procedure:            Colonoscopy Indications:          Screening for colorectal malignant neoplasm Providers:            Manya Silvas, MD Referring MD:         [name]. Caryl Bis (Referring MD) Medicines:            Propofol per Anesthesia Complications:        No immediate complications. Procedure:            Pre-Anesthesia Assessment:                       - After reviewing the risks and benefits, the patient                        was deemed in satisfactory condition to undergo the                        procedure.                       After obtaining informed consent, the colonoscope was                        passed under direct vision. Throughout the procedure,                        the patient's blood pressure, pulse, and oxygen                        saturations were monitored continuously. The                        Colonoscope was introduced through the anus and                        advanced to the the cecum, identified by appendiceal                        orifice and ileocecal valve. The colonoscopy was                        performed without difficulty. The patient tolerated the                        procedure well. The quality of the bowel preparation                        was excellent. Findings:      A small polyp was found in the descending colon. The polyp was sessile.       The polyp was removed with a hot snare. Resection and retrieval were       complete.      A small polyp was found in the ascending colon. The polyp was sessile.       The polyp was removed with a hot snare. Resection and retrieval were  complete.      A small polyp was found in the transverse colon. The polyp was  sessile.       The polyp was removed with a hot snare. Resection and retrieval were       complete.      Multiple medium-mouthed diverticula were found in the sigmoid colon and       descending colon.      Internal hemorrhoids were found during endoscopy. The hemorrhoids were       small and Grade I (internal hemorrhoids that do not prolapse).      The exam was otherwise without abnormality. Impression:           - One small polyp in the descending colon, removed with                        a hot snare. Resected and retrieved.                       - One small polyp in the ascending colon, removed with                        a hot snare. Resected and retrieved.                       - One small polyp in the transverse colon, removed with                        a hot snare. Resected and retrieved.                       - Diverticulosis in the sigmoid colon and in the                        descending colon.                       - Internal hemorrhoids.                       - The examination was otherwise normal. Recommendation:       - Await pathology results. Manya Silvas, MD 11/09/2017 10:20:18 AM This report has been signed electronically. Number of Addenda: 0 Note Initiated On: 11/09/2017 9:39 AM Scope Withdrawal Time: 0 hours 5 minutes 55 seconds  Total Procedure Duration: 0 hours 16 minutes 3 seconds       [facility]

## 2017-11-09 NOTE — Anesthesia Preprocedure Evaluation (Signed)
Anesthesia Evaluation  Patient identified by MRN, date of birth, ID band Patient awake    Reviewed: Allergy & Precautions, H&P , NPO status , Patient's Chart, lab work & pertinent test results  Airway Mallampati: II   Neck ROM: full    Dental   Pulmonary neg pulmonary ROS,    breath sounds clear to auscultation       Cardiovascular hypertension,  Rhythm:regular Rate:Normal     Neuro/Psych    GI/Hepatic GERD  ,  Endo/Other    Renal/GU    Prostate CA    Musculoskeletal  (+) Arthritis , Rheumatoid disorders,    Abdominal   Peds  Hematology   Anesthesia Other Findings Past Medical History: No date: Diverticulosis of colon No date: GERD (gastroesophageal reflux disease)     Comment:  "occas" No date: History of basal cell carcinoma (BCC) excision     Comment:  09-25-2014  right lower eyelid s/p moh's dx /  left               lower eyelid scheduled removal 07-05-2017 No date: History of squamous cell carcinoma excision     Comment:  01/ 2015  left lateral cheek  s/p  moh's sx No date: Hypertension     Comment:  sees Dr. Jackalyn Lombard, [place] No date: Nocturia No date: OA (osteoarthritis) urologist-  dr eskidge/  oncologist- dr [name]: Prostate cancer [facility])     Comment:  dx 04/ 2018-- Stage T1c, Gleason 3+3,  PSA 7.1,  vol               38.6cc No date: Rheumatoid arteritis     Comment:  rheumotologist-   dr Elpidio Galea No date: Rosacea     Comment:  Dr. Sharlett Iles No date: Vitamin D deficiency  Reproductive/Obstetrics                             Anesthesia Physical  Anesthesia Plan  ASA: II  Anesthesia Plan: General   Post-op Pain Management:    Induction: Intravenous  PONV Risk Score and Plan:   Airway Management Planned: Nasal Cannula  Additional Equipment:   Intra-op Plan:   Post-operative Plan:   Informed Consent: I have reviewed the patients History and  Physical, chart, labs and discussed the procedure including the risks, benefits and alternatives for the proposed anesthesia with the patient or authorized representative who has indicated his/her understanding and acceptance.   Dental advisory given  Plan Discussed with: CRNA, Anesthesiologist and Surgeon  Anesthesia Plan Comments:         Anesthesia Quick Evaluation

## 2017-11-09 NOTE — Anesthesia Post-op Follow-up Note (Signed)
Anesthesia QCDR form completed.        

## 2017-11-09 NOTE — Transfer of Care (Signed)
Immediate Anesthesia Transfer of Care Note  Patient: Julian Pineda  Procedure(s) Performed: COLONOSCOPY WITH PROPOFOL (N/A )  Patient Location: PACU and Endoscopy Unit  Anesthesia Type:General  Level of Consciousness: awake, drowsy and patient cooperative  Airway & Oxygen Therapy: Patient Spontanous Breathing and Patient connected to nasal cannula oxygen  Post-op Assessment: Report given to RN and Post -op Vital signs reviewed and stable  Post vital signs: Reviewed and stable  Last Vitals:  Vitals:   11/09/17 0804 11/09/17 1022  BP: 116/70   Pulse: 72 76  Resp: 20 16  Temp: (!) 36.1 C (!) 36.1 C  SpO2: 100% 99%    Last Pain:  Vitals:   11/09/17 1022  TempSrc: Tympanic         Complications: No apparent anesthesia complications

## 2017-11-10 ENCOUNTER — Encounter: Payer: Self-pay | Admitting: Unknown Physician Specialty

## 2017-11-10 LAB — SURGICAL PATHOLOGY

## 2017-11-16 DIAGNOSIS — C44319 Basal cell carcinoma of skin of other parts of face: Secondary | ICD-10-CM | POA: Diagnosis not present

## 2017-11-16 DIAGNOSIS — Z85828 Personal history of other malignant neoplasm of skin: Secondary | ICD-10-CM | POA: Diagnosis not present

## 2017-12-20 ENCOUNTER — Other Ambulatory Visit: Payer: PPO

## 2017-12-20 DIAGNOSIS — R350 Frequency of micturition: Secondary | ICD-10-CM | POA: Diagnosis not present

## 2017-12-21 LAB — PSA: Prostate Specific Ag, Serum: 1.9 ng/mL (ref 0.0–4.0)

## 2017-12-22 ENCOUNTER — Telehealth: Payer: Self-pay

## 2017-12-22 ENCOUNTER — Encounter: Payer: PPO | Admitting: Urology

## 2017-12-22 NOTE — Telephone Encounter (Signed)
Information was read to pt while in lobby. Pt did not have additional questions. Pt was not seen today and will call back to make 86mo f/u.

## 2017-12-22 NOTE — Telephone Encounter (Signed)
Festus Aloe, MD  Lestine Box, LPN        Notify patient - PSA continues to slowly decline which is a good pattern after brachytherapy. Check PSA in 6 months.    Pt is currently in the lobby waiting to be seen.

## 2017-12-30 NOTE — Progress Notes (Signed)
This encounter was created in error - please disregard.

## 2017-12-31 ENCOUNTER — Other Ambulatory Visit: Payer: Self-pay | Admitting: Family Medicine

## 2018-01-26 ENCOUNTER — Ambulatory Visit (INDEPENDENT_AMBULATORY_CARE_PROVIDER_SITE_OTHER): Payer: PPO | Admitting: Family Medicine

## 2018-01-26 ENCOUNTER — Encounter: Payer: Self-pay | Admitting: Family Medicine

## 2018-01-26 ENCOUNTER — Other Ambulatory Visit: Payer: Self-pay

## 2018-01-26 DIAGNOSIS — E559 Vitamin D deficiency, unspecified: Secondary | ICD-10-CM

## 2018-01-26 DIAGNOSIS — I10 Essential (primary) hypertension: Secondary | ICD-10-CM | POA: Diagnosis not present

## 2018-01-26 DIAGNOSIS — M069 Rheumatoid arthritis, unspecified: Secondary | ICD-10-CM | POA: Diagnosis not present

## 2018-01-26 DIAGNOSIS — C61 Malignant neoplasm of prostate: Secondary | ICD-10-CM | POA: Diagnosis not present

## 2018-01-26 NOTE — Patient Instructions (Signed)
Nice to see you. Please get the lab work completed and have them send [place] a copy. Please discuss the cyst on your wrist with your rheumatologist. Please continue to monitor your blood pressure and if it starts to go up please let us know.

## 2018-01-26 NOTE — Assessment & Plan Note (Addendum)
PSA appears to be trending down.  He will continue to follow with urology.

## 2018-01-26 NOTE — Assessment & Plan Note (Addendum)
He will see rheumatology soon.  He will discusse the nodule on his left wrist with them.

## 2018-01-26 NOTE — Assessment & Plan Note (Signed)
Recheck vitamin D with labs.  Written prescription given.

## 2018-01-26 NOTE — Progress Notes (Signed)
  Tommi Rumps, MD Phone: [phone]  Julian Pineda is a 72 y.o. male who presents today for f/u.  HYPERTENSION  Disease Monitoring  Home BP Monitoring <914 systolically Chest pain- no    Dyspnea- no Medications  Compliance-  Taking losartan.  Edema- no  Prostate cancer: He is being followed by urology.  He had radiation seeds implanted and his PSA is trended down.  He is doing well with this.  He has rheumatoid arthritis and remains on methotrexate.  Notes most of his symptoms are in his hands.  He sees rheumatology soon.  He has developed a cyst on the radial aspect of his left wrist that is nontender.  Does not bother him.  Vitamin D deficiency: He is currently on supplementation and needs recheck.    Social History   Tobacco Use  Smoking Status Never Smoker  Smokeless Tobacco Former Systems developer  . Types: Chew     ROS see history of present illness  Objective  Physical Exam Vitals:   01/26/18 0801  BP: 140/82  Pulse: 65  Temp: 98 F (36.7 C)  SpO2: 98%    BP Readings from Last 3 Encounters:  01/26/18 140/82  11/09/17 118/72  09/19/17 (!) 167/85   Wt Readings from Last 3 Encounters:  01/26/18 209 lb 12.8 oz (95.2 kg)  11/09/17 200 lb (90.7 kg)  09/19/17 202 lb 3.2 oz (91.7 kg)    Physical Exam  Constitutional: No distress.  Cardiovascular: Normal rate, regular rhythm and normal heart sounds.  Pulmonary/Chest: Effort normal and breath sounds normal.  Musculoskeletal: He exhibits no edema.  Left volar aspect radial wrist with a slight nodule that is moderately soft and nontender with no overlying skin changes  Neurological: He is alert. Gait normal.  Skin: Skin is warm and dry. He is not diaphoretic.     Assessment/Plan: Please see individual problem list.  Hypertension Adequately controlled for age.  Continue losartan.  Given a prescription for [facility] work [facility].  Rheumatoid arthritis [facility]) He will see  rheumatology soon.  He will discusse the nodule on his left wrist with them.  Malignant neoplasm of prostate (HCC) PSA appears to be trending down.  He will continue to follow with urology.  Vitamin D deficiency disease Recheck vitamin D with labs.  Written prescription given.   No orders of the defined types were placed in this encounter.   No orders of the defined types were placed in this encounter.    Tommi Rumps, MD [place]

## 2018-01-26 NOTE — Assessment & Plan Note (Signed)
Adequately controlled for age.  Continue losartan.  Given a prescription for lab work to [facility].

## 2018-01-27 ENCOUNTER — Observation Stay (HOSPITAL_COMMUNITY)
Admission: EM | Admit: 2018-01-27 | Discharge: 2018-01-29 | Disposition: A | Discharge status: Home / Self Care | Payer: PPO | Attending: Internal Medicine | Admitting: Internal Medicine

## 2018-01-27 ENCOUNTER — Emergency Department (HOSPITAL_COMMUNITY): Payer: PPO

## 2018-01-27 ENCOUNTER — Encounter (HOSPITAL_COMMUNITY): Payer: Self-pay | Admitting: Emergency Medicine

## 2018-01-27 DIAGNOSIS — I371 Nonrheumatic pulmonary valve insufficiency: Secondary | ICD-10-CM | POA: Diagnosis not present

## 2018-01-27 DIAGNOSIS — Z880 Allergy status to penicillin: Secondary | ICD-10-CM | POA: Diagnosis not present

## 2018-01-27 DIAGNOSIS — R55 Syncope and collapse: Secondary | ICD-10-CM

## 2018-01-27 DIAGNOSIS — Z79899 Other long term (current) drug therapy: Secondary | ICD-10-CM | POA: Diagnosis not present

## 2018-01-27 DIAGNOSIS — L719 Rosacea, unspecified: Secondary | ICD-10-CM | POA: Diagnosis not present

## 2018-01-27 DIAGNOSIS — I351 Nonrheumatic aortic (valve) insufficiency: Secondary | ICD-10-CM | POA: Diagnosis not present

## 2018-01-27 DIAGNOSIS — D649 Anemia, unspecified: Secondary | ICD-10-CM | POA: Diagnosis not present

## 2018-01-27 DIAGNOSIS — C61 Malignant neoplasm of prostate: Secondary | ICD-10-CM | POA: Diagnosis not present

## 2018-01-27 DIAGNOSIS — R42 Dizziness and giddiness: Secondary | ICD-10-CM | POA: Diagnosis not present

## 2018-01-27 DIAGNOSIS — E559 Vitamin D deficiency, unspecified: Secondary | ICD-10-CM | POA: Diagnosis not present

## 2018-01-27 DIAGNOSIS — Z9889 Other specified postprocedural states: Secondary | ICD-10-CM | POA: Insufficient documentation

## 2018-01-27 DIAGNOSIS — I10 Essential (primary) hypertension: Secondary | ICD-10-CM | POA: Diagnosis not present

## 2018-01-27 DIAGNOSIS — Z85828 Personal history of other malignant neoplasm of skin: Secondary | ICD-10-CM | POA: Diagnosis not present

## 2018-01-27 DIAGNOSIS — R351 Nocturia: Secondary | ICD-10-CM | POA: Diagnosis not present

## 2018-01-27 DIAGNOSIS — Z96652 Presence of left artificial knee joint: Secondary | ICD-10-CM | POA: Insufficient documentation

## 2018-01-27 DIAGNOSIS — R404 Transient alteration of awareness: Secondary | ICD-10-CM | POA: Diagnosis not present

## 2018-01-27 DIAGNOSIS — K219 Gastro-esophageal reflux disease without esophagitis: Secondary | ICD-10-CM | POA: Insufficient documentation

## 2018-01-27 DIAGNOSIS — N179 Acute kidney failure, unspecified: Secondary | ICD-10-CM | POA: Diagnosis present

## 2018-01-27 DIAGNOSIS — R112 Nausea with vomiting, unspecified: Secondary | ICD-10-CM | POA: Diagnosis not present

## 2018-01-27 DIAGNOSIS — M1990 Unspecified osteoarthritis, unspecified site: Secondary | ICD-10-CM | POA: Insufficient documentation

## 2018-01-27 DIAGNOSIS — N400 Enlarged prostate without lower urinary tract symptoms: Secondary | ICD-10-CM | POA: Insufficient documentation

## 2018-01-27 DIAGNOSIS — Z87891 Personal history of nicotine dependence: Secondary | ICD-10-CM | POA: Diagnosis not present

## 2018-01-27 DIAGNOSIS — M069 Rheumatoid arthritis, unspecified: Secondary | ICD-10-CM | POA: Diagnosis not present

## 2018-01-27 HISTORY — DX: Syncope and collapse: R55

## 2018-01-27 LAB — CBC WITH DIFFERENTIAL/PLATELET
Basophils Absolute: 0 10*3/uL (ref 0.0–0.1)
Basophils Relative: 0 %
EOS PCT: 1 %
Eosinophils Absolute: 0.1 10*3/uL (ref 0.0–0.7)
HCT: 35.3 % — ABNORMAL LOW (ref 39.0–52.0)
HEMOGLOBIN: 12 g/dL — AB (ref 13.0–17.0)
LYMPHS ABS: 0.7 10*3/uL (ref 0.7–4.0)
LYMPHS PCT: 8 %
MCH: 33.3 pg (ref 26.0–34.0)
MCHC: 34 g/dL (ref 30.0–36.0)
MCV: 98.1 fL (ref 78.0–100.0)
Monocytes Absolute: 0.2 10*3/uL (ref 0.1–1.0)
Monocytes Relative: 3 %
NEUTROS PCT: 88 %
Neutro Abs: 7.5 10*3/uL (ref 1.7–7.7)
Platelets: 197 10*3/uL (ref 150–400)
RBC: 3.6 MIL/uL — AB (ref 4.22–5.81)
RDW: 14.3 % (ref 11.5–15.5)
WBC: 8.5 10*3/uL (ref 4.0–10.5)

## 2018-01-27 LAB — COMPREHENSIVE METABOLIC PANEL
ALT: 38 U/L (ref 17–63)
ANION GAP: 11 (ref 5–15)
AST: 37 U/L (ref 15–41)
Albumin: 3.5 g/dL (ref 3.5–5.0)
Alkaline Phosphatase: 41 U/L (ref 38–126)
BUN: 25 mg/dL — AB (ref 6–20)
CHLORIDE: 105 mmol/L (ref 101–111)
CO2: 21 mmol/L — ABNORMAL LOW (ref 22–32)
Calcium: 8.6 mg/dL — ABNORMAL LOW (ref 8.9–10.3)
Creatinine, Ser: 1.38 mg/dL — ABNORMAL HIGH (ref 0.61–1.24)
GFR calc Af Amer: 58 mL/min — ABNORMAL LOW (ref >60)
GFR, EST NON AFRICAN AMERICAN: 50 mL/min — AB (ref >60)
Glucose, Bld: 109 mg/dL — ABNORMAL HIGH (ref 65–99)
POTASSIUM: 3.7 mmol/L (ref 3.5–5.1)
Sodium: 137 mmol/L (ref 135–145)
Total Bilirubin: 0.7 mg/dL (ref 0.3–1.2)
Total Protein: 5.7 g/dL — ABNORMAL LOW (ref 6.5–8.1)

## 2018-01-27 LAB — TROPONIN I

## 2018-01-27 LAB — D-DIMER, QUANTITATIVE: D-Dimer, Quant: 0.4 ug/mL-FEU (ref 0.00–0.50)

## 2018-01-27 MED ORDER — SODIUM CHLORIDE 0.9 % IV BOLUS
1000.0000 mL | Freq: Once | INTRAVENOUS | Status: AC
  Administered 2018-01-27: 1000 mL via INTRAVENOUS

## 2018-01-27 NOTE — ED Triage Notes (Signed)
Per EMS pt was on his way home with his family riding in the car and had a syncopal event that lasted 2 minutes.  [place] and [place] him out of the car where he vomited.  GCFD has an initial BP of 60/40.  By the time EMS arrived pt had color back and was back to baseline AOx4 NAD Noted. Currently denies pain AOx4.

## 2018-01-27 NOTE — H&P (Addendum)
TRH H&P   Patient Demographics:    Julian Pineda, is a 72 y.o. male  MRN: [id number]   DOB - [birth year]  Admit Date - 01/27/2018  Outpatient Primary MD for the patient is Caryl Bis [name], MD  Referring MD/NP/PA:  Wendee Beavers  Outpatient Specialists:   Patient coming from: home  Chief Complaint  Patient presents with  . Loss of Consciousness      HPI:    Julian Pineda  is a 72 y.o. male, w rheumatoid arthritis, prostate cancer apparently was in the passenger seat driving home after supper and suddenly felt lightheaded and passed out. Pt denies presyncopal symptoms other than feeling slightly lightheaded. Pt was out for a few minutes and had n/v x1. No bloody emesis.   Pt denies cp, palp, sob, starring spell, focal weakness, numbness, tingling.    Per EMS initial bp 60/40.  Pt received 1L ns iv x1   In ED,  CXR IMPRESSION: No active cardiopulmonary disease.  Trop negative D dimer negative  Na 137, K 3.7, Bun 25, Creatinine 1.38 Ast 37, Alt 38  Wbc 8.5, Hgb 12.0, Plt 197  EKG unable to pull up on computer, "There is a problem with Adobe Acrobat Reader"  Pt will be admitted for w/up of syncope    Review of systems:    In addition to the HPI above,  No Fever-chills, No Headache, No changes with Vision or hearing, No problems swallowing food or Liquids, No Chest pain, Cough or Shortness of Breath, No Abdominal pain, No Nausea or Vommitting, Bowel movements are regular, No Blood in stool or Urine, No dysuria, No new skin rashes or bruises, No new joints pains-aches,  No new weakness, tingling, numbness in any extremity, No recent weight gain or loss, No polyuria, polydypsia or polyphagia, No significant Mental Stressors.  A full 10 point Review of Systems was done, except as stated above, all other Review of Systems were negative.   With Past History of  the following :    Past Medical History:  Diagnosis Date  . Diverticulosis of colon   . GERD (gastroesophageal reflux disease)    "occas"  . History of basal cell carcinoma (BCC) excision    09-25-2014  right lower eyelid s/p moh's dx /  left lower eyelid scheduled removal 07-05-2017  . History of squamous cell carcinoma excision    01/ 2015  left lateral cheek  s/p  moh's sx  . Hypertension    sees Dr. Jackalyn Lombard, [place]  . Nocturia   . OA (osteoarthritis)   . Prostate cancer [facility]) urologist-  dr eskidge/  oncologist- dr [name]   dx 04/ 2018-- Stage T1c, Gleason 3+3,  PSA 7.1,  vol 38.6cc  . Rheumatoid arteritis    rheumotologist-   dr Elpidio Galea  . Rosacea    Dr. Sharlett Iles  . Vitamin D deficiency  Past Surgical History:  Procedure Laterality Date  . ACHILLES TENDON SURGERY Right 1984  . COLONOSCOPY  2008  . COLONOSCOPY WITH PROPOFOL N/A 11/09/2017   Procedure: COLONOSCOPY WITH PROPOFOL;  Surgeon: Manya Silvas, MD;  Location: [facility] ENDOSCOPY;  Service: Endoscopy;  Laterality: N/A;  . MOHS SURGERY  09-25-2014;  11-27-2013   [facility]   09-25-2014 right lower eyelid /  11-27-2013  left lateral cheek  . RADIOACTIVE SEED IMPLANT N/A 06/17/2017   Procedure: RADIOACTIVE SEED IMPLANT/BRACHYTHERAPY IMPLANT;  Surgeon: Festus Aloe, MD;  Location: [facility];  Service: Urology;  Laterality: N/A;  . TONSILLECTOMY  child  . TOTAL KNEE ARTHROPLASTY  12/04/2012   Procedure: TOTAL KNEE ARTHROPLASTY;  Surgeon: Vickey Huger, MD;  Location: [place];  Service: Orthopedics;  Laterality: Left;  left total knee arthroplasty  . VARICOSE VEIN SURGERY  2000   left leg      Social History:     Social History   Tobacco Use  . Smoking status: Never Smoker  . Smokeless tobacco: Former Systems developer    Types: Chew  Substance Use Topics  . Alcohol use: Yes    Alcohol/week: 8.4 oz    Types: 14 Shots of liquor per week    Comment: 2 per day     Lives - at home  Mobility -  walks by self   Family History :     Family History  Problem Relation Age of Onset  . Cancer Mother        Multiple Myeloma  . Alcohol abuse Father   . Prostate cancer Brother   . Breast cancer Daughter   . Hematuria Neg Hx   . Sickle cell anemia Neg Hx   . Kidney cancer Neg Hx   . Bladder Cancer Neg Hx       Home Medications:   Prior to Admission medications   Medication Sig Start Date End Date Taking? Authorizing Provider  cholecalciferol (VITAMIN D) 1000 units tablet Take 1,000 Units by mouth every morning.     [provider]  folic acid (FOLVITE) 1 MG tablet TAKE 1 TABLET EVERY DAY Patient taking differently: TAKE 1 TABLET EVERY DAY--- takes in am 09/15/15   Jackolyn Confer, MD  losartan (COZAAR) 100 MG tablet TAKE 1 TABLET DAILY 01/02/18   Leone Haven, MD  meloxicam ([place]) 15 MG tablet TAKE 1 TABLET BY MOUTH ONCE DAILY WITH FOOD Patient taking differently: TAKE 1 TABLET BY MOUTH ONCE DAILY WITH FOOD--- takes in am 10/07/16   Leone Haven, MD  methotrexate 50 MG/2ML injection INJECT 1ML WEEKLY 04/15/16   Jackolyn Confer, MD  metroNIDAZOLE (METROCREAM) 0.75 % cream Apply topically 2 (two) times daily.    [provider]  tamsulosin (FLOMAX) 0.4 MG CAPS capsule Take 1 capsule (0.4 mg total) by mouth daily after supper. 06/17/17   Festus Aloe, MD     Allergies:     Allergies  Allergen Reactions  . Penicillins Other (See Comments)    Unknown childhood reaction     Physical Exam:   Vitals  Blood pressure 114/68, pulse 86, temperature 97.9 F (36.6 C), temperature source Oral, resp. rate 19, height 5' 10" (1.778 m), weight 90.7 kg (200 lb), SpO2 97 %.   1. General lying in bed in NAD,   2. Normal affect and insight, Not Suicidal or Homicidal, Awake Alert, Oriented X 3.  3. No F.N deficits, ALL C.Nerves Intact, Strength 5/5 all 4 extremities, Sensation intact all  4 extremities, Plantars down going.  4. Ears and Eyes appear  Normal, Conjunctivae clear, PERRLA. Moist Oral Mucosa.  5. Supple Neck, No JVD, No cervical lymphadenopathy appriciated, No Carotid Bruits.  6. Symmetrical Chest wall movement, Good air movement bilaterally, CTAB.  7. RRR, No Gallops, Rubs or Murmurs, No Parasternal Heave.  8. Positive Bowel Sounds, Abdomen Soft, No tenderness, No organomegaly appriciated,No rebound -guarding or rigidity.  9.  No Cyanosis, Normal Skin Turgor, No Skin Rash or Bruise.  10. Good muscle tone,  joints appear normal , no effusions, Normal ROM.  11. No Palpable Lymph Nodes in Neck or Axillae     Data Review:    CBC Recent Labs  Lab 01/27/18 2047  WBC 8.5  HGB 12.0*  HCT 35.3*  PLT 197  MCV 98.1  MCH 33.3  MCHC 34.0  RDW 14.3  LYMPHSABS 0.7  MONOABS 0.2  EOSABS 0.1  BASOSABS 0.0   ------------------------------------------------------------------------------------------------------------------  Chemistries  Recent Labs  Lab 01/27/18 2047  NA 137  K 3.7  CL 105  CO2 21*  GLUCOSE 109*  BUN 25*  CREATININE 1.38*  CALCIUM 8.6*  AST 37  ALT 38  ALKPHOS 41  BILITOT 0.7   ------------------------------------------------------------------------------------------------------------------ estimated creatinine clearance is 56.4 mL/min (A) (by C-G formula based on SCr of 1.38 mg/dL (H)). ------------------------------------------------------------------------------------------------------------------ No results for input(s): TSH, T4TOTAL, T3FREE, THYROIDAB in the last 72 hours.  Invalid input(s): FREET3  Coagulation profile No results for input(s): INR, PROTIME in the last 168 hours. ------------------------------------------------------------------------------------------------------------------- Recent Labs    01/27/18 2133  DDIMER 0.40   -------------------------------------------------------------------------------------------------------------------  Cardiac Enzymes Recent  Labs  Lab 01/27/18 2047  TROPONINI <0.03   ------------------------------------------------------------------------------------------------------------------ No results found for: BNP   ---------------------------------------------------------------------------------------------------------------  Urinalysis    Component Value Date/Time   COLORURINE YELLOW 11/29/2012 0845   APPEARANCEUR Clear 06/01/2017 1425   LABSPEC 1.030 11/29/2012 0845   PHURINE 5.0 11/29/2012 0845   GLUCOSEU Negative 06/01/2017 1425   HGBUR NEGATIVE 11/29/2012 0845   BILIRUBINUR Negative 06/01/2017 1425   KETONESUR NEGATIVE 11/29/2012 0845   PROTEINUR Negative 06/01/2017 1425   PROTEINUR NEGATIVE 11/29/2012 0845   UROBILINOGEN 0.2 11/29/2012 0845   NITRITE Negative 06/01/2017 1425   NITRITE NEGATIVE 11/29/2012 0845   LEUKOCYTESUR Negative 06/01/2017 1425    ----------------------------------------------------------------------------------------------------------------   Imaging Results:    Dg Chest 2 View  Result Date: 01/27/2018 CLINICAL DATA:  Syncope. EXAM: CHEST - 2 VIEW COMPARISON:  Radiographs of April 29, 2017. FINDINGS: The heart size and mediastinal contours are within normal limits. No pneumothorax or pleural effusion is noted. Right lung is clear. Stable left basilar scarring is noted. No acute pulmonary disease is noted. The visualized skeletal structures are unremarkable. IMPRESSION: No active cardiopulmonary disease. Electronically Signed   By: Marijo Conception, M.D.   On: 01/27/2018 21:53       Assessment & Plan:    Active Problems:   Syncope    Syncope Tele Trop I q6h x3 Check orthostatic bp  CT brain  Check carotid ultrasound Check cardiac echo  Hypotension Check cortisol Hydrate with ns iv  Hypertension Hold Losartan due to recent hypotension  Bph/ Prostate cancer (gleason 3+3=6) Cont Flomax, if orthostatic, may need to Stop  Snoring Please get tested for sleep  apnea as outpatient  DVT Prophylaxis lovenox - SCDs   AM Labs Ordered, also please review Full Orders  Family Communication: Admission, patients condition and plan of care including tests being ordered have been discussed with the patient  who indicate understanding and agree with the plan and Code Status.  Code Status FULL CODE  Likely DC to  home  Condition GUARDED   Consults called: none  Admission status: observation   Time spent in minutes : 45   Jani Gravel M.D on 01/27/2018 at 11:08 PM  Between 7am to 7pm - Pager - [phone] . After 7pm go to [email or web address] - password [facility]  [facility] - Office  [phone]

## 2018-01-27 NOTE — ED Provider Notes (Signed)
[facility] EMERGENCY DEPARTMENT Provider Note   CSN: [id number] Arrival date & time:        History   Chief Complaint Chief Complaint  Patient presents with  . Loss of Consciousness    HPI Julian Pineda is a 72 y.o. male with history of hypertension and rheumatoid arthritis who presented by EMS with syncope.  Patient was on the way home after dinner with family members when he suddenly felt lightheaded and passed out.  He was in the passenger seat.  He does not remember how long he was down.  When he woke up, he was lying on the grass by the roadside.  He had an episode of emesis.  Per EMS, initial blood pressure was down to 60/40.  He looked diaphoretic and pale. No chest pain or shortness of breath.  He was given normal saline bolus 1 L and Zofran.  Hypotension resolved after IV fluid. Patient states feeling well after he woke up.  He denies chest pain, dyspnea, palpitation, headache and abdominal pain.  Denies smoking cigarettes.  Admits to drinking alcohol.  He said he had 3 gin and tonics about 2 PM.  Denies recreational drug use. HPI  Past Medical History:  Diagnosis Date  . Diverticulosis of colon   . GERD (gastroesophageal reflux disease)    "occas"  . History of basal cell carcinoma (BCC) excision    09-25-2014  right lower eyelid s/p moh's dx /  left lower eyelid scheduled removal 07-05-2017  . History of squamous cell carcinoma excision    01/ 2015  left lateral cheek  s/p  moh's sx  . Hypertension    sees Dr. Jackalyn Lombard, [place]  . Nocturia   . OA (osteoarthritis)   . Prostate cancer [facility]) urologist-  dr eskidge/  oncologist- dr [name]   dx 04/ 2018-- Stage T1c, Gleason 3+3,  PSA 7.1,  vol 38.6cc  . Rheumatoid arteritis    rheumotologist-   dr Elpidio Galea  . Rosacea    Dr. Sharlett Iles  . Vitamin D deficiency     Patient Active Problem List   Diagnosis Date Noted  . Basal cell carcinoma 07/28/2017  . Low back pain 01/25/2017  .  Malignant neoplasm of prostate ([place]) 08/24/2016  . Encounter for general adult medical examination with abnormal findings 07/10/2016  . Personal history of other malignant neoplasm of skin 11/20/2013  . S/P TKR (total knee replacement) 01/16/2013  . Rheumatoid arthritis ([place]) 02/16/2012  . Vitamin D deficiency disease 02/16/2012  . Hypertension 02/16/2012    Past Surgical History:  Procedure Laterality Date  . ACHILLES TENDON SURGERY Right 1984  . COLONOSCOPY  2008  . COLONOSCOPY WITH PROPOFOL N/A 11/09/2017   Procedure: COLONOSCOPY WITH PROPOFOL;  Surgeon: Manya Silvas, MD;  Location: [facility] ENDOSCOPY;  Service: Endoscopy;  Laterality: N/A;  . MOHS SURGERY  09-25-2014;  11-27-2013   [facility]   09-25-2014 right lower eyelid /  11-27-2013  left lateral cheek  . RADIOACTIVE SEED IMPLANT N/A 06/17/2017   Procedure: RADIOACTIVE SEED IMPLANT/BRACHYTHERAPY IMPLANT;  Surgeon: Festus Aloe, MD;  Location: [facility];  Service: Urology;  Laterality: N/A;  . TONSILLECTOMY  child  . TOTAL KNEE ARTHROPLASTY  12/04/2012   Procedure: TOTAL KNEE ARTHROPLASTY;  Surgeon: Vickey Huger, MD;  Location: [place];  Service: Orthopedics;  Laterality: Left;  left total knee arthroplasty  . VARICOSE VEIN SURGERY  2000   left leg        Home  Medications    Prior to Admission medications   Medication Sig Start Date End Date Taking? Authorizing Provider  cholecalciferol (VITAMIN D) 1000 units tablet Take 1,000 Units by mouth every morning.     [provider]  folic acid (FOLVITE) 1 MG tablet TAKE 1 TABLET EVERY DAY Patient taking differently: TAKE 1 TABLET EVERY DAY--- takes in am 09/15/15   Jackolyn Confer, MD  losartan (COZAAR) 100 MG tablet TAKE 1 TABLET DAILY 01/02/18   Leone Haven, MD  meloxicam ([place]) 15 MG tablet TAKE 1 TABLET BY MOUTH ONCE DAILY WITH FOOD Patient taking differently: TAKE 1 TABLET BY MOUTH ONCE DAILY WITH FOOD--- takes in am 10/07/16   Leone Haven, MD  methotrexate 50 MG/2ML injection INJECT 1ML WEEKLY 04/15/16   Jackolyn Confer, MD  metroNIDAZOLE (METROCREAM) 0.75 % cream Apply topically 2 (two) times daily.    [provider]  tamsulosin (FLOMAX) 0.4 MG CAPS capsule Take 1 capsule (0.4 mg total) by mouth daily after supper. 06/17/17   Festus Aloe, MD    Family History Family History  Problem Relation Age of Onset  . Cancer Mother        Multiple Myeloma  . Alcohol abuse Father   . Prostate cancer Brother   . Breast cancer Daughter   . Hematuria Neg Hx   . Sickle cell anemia Neg Hx   . Kidney cancer Neg Hx   . Bladder Cancer Neg Hx     Social History Social History   Tobacco Use  . Smoking status: Never Smoker  . Smokeless tobacco: Former Systems developer    Types: Chew  Substance Use Topics  . Alcohol use: Yes    Alcohol/week: 8.4 oz    Types: 14 Shots of liquor per week    Comment: 2 per day  . Drug use: No     Allergies   Penicillins   Review of Systems Review of Systems  Constitutional: Positive for diaphoresis. Negative for chills and fever.  HENT: Negative for congestion, hearing loss, rhinorrhea and sore throat.   Eyes: Negative for pain and visual disturbance.  Respiratory: Negative for cough, chest tightness and shortness of breath.   Cardiovascular: Negative for chest pain, palpitations and leg swelling.  Gastrointestinal: Positive for nausea and vomiting. Negative for abdominal pain, blood in stool and diarrhea.  Genitourinary: Negative for dysuria, frequency, hematuria and scrotal swelling.  Musculoskeletal: Positive for arthralgias. Negative for myalgias.  Skin: Negative for rash.  Neurological: Positive for syncope and light-headedness. Negative for dizziness, seizures, weakness, numbness and headaches.  Hematological: Negative for adenopathy. Does not bruise/bleed easily.  Psychiatric/Behavioral: Negative for dysphoric mood. The patient is not nervous/anxious.    Physical  Exam Updated Vital Signs BP 122/60 (BP Location: Right Arm)   Pulse 74   Temp 97.9 F (36.6 C) (Oral)   Resp 20   Ht 5' 10" (1.778 m)   Wt 90.7 kg (200 lb)   SpO2 100%   BMI 28.70 kg/m   Physical Exam GEN: appears well & comfortable lying in bed. No apparent distress. Head: normocephalic and atraumatic  Eyes: conjunctiva without injection. Sclera anicteric.  PERRL, EOMI Oropharynx: MMM. No erythema. No exudation or petechiae.  Uvula midline HEM: negative for cervical or periauricular lymphadenopathies CVS: RRR, nl s1 & s2, no murmurs, no edema or calf tenderness RESP: no IWOB, good air movement bilaterally, CTAB GI: BS present & normal, soft, NTND, no guarding, no rebound, no palpable mass GU: no suprapubic  or CVA tenderness MSK: no focal tenderness or notable swelling SKIN: Seborrheic dermatitis on his back ENDO: negative thyromegally  NEURO: Awake, alert and oriented appropriately. Cranial nerves II-XII intact, motor 5/5 in all muscle groups of UE and LE bilaterally, normal tone, light sensation intact in all dermatomes of upper and lower ext bilaterally.  PSYCH: euthymic mood with congruent affect  ED Treatments / Results  Labs (all labs ordered are listed, but only abnormal results are displayed) Labs Reviewed  TROPONIN I  COMPREHENSIVE METABOLIC PANEL  CBC WITH DIFFERENTIAL/PLATELET    EKG None  Radiology No results found.  Procedures Procedures (including critical care time)  Medications Ordered in ED Medications - No data to display   Initial Impression / Assessment and Plan / ED Course  I have reviewed the triage vital signs and the nursing notes.  Pertinent labs & imaging results that were available during my care of the patient were reviewed by me and considered in my medical decision making (see chart for details).  10-year-old male with history of hypertension rheumatoid arthritis on methotrexate who presents with syncopal episode.  Syncope without  any prodrome concerning for some arrhythmia.  Found to be hypotensive to 60s over 40s by EMS but doubt orthostatic hypotension and there was no positional change during syncopal episode.  Blood pressure on low side of normal.  He is currently asymptomatic without chest pain, dyspnea or palpitation.  Neuro exam within normal limits.  Will obtain EKG, troponin, d-dimer, basic labs such as CBC and CMP and chest x-ray.  Will give another normal saline bolus.  EKG, initial troponin, d-dimer and chest x-ray within normal limits.  CBC with mild anemia to 12.0.  Serum creatinine mildly elevated to 1.38.  Orthostatic vital pending.  Patient felt unsteady when he tried to go to the bathroom.  Blood pressure 113/67 after normal saline bolus.  Patient states that his systolic blood pressure normally runs in 130s and 140s.  Cons[facility]ed[facility] hospitalist, who will admit patient for further evaluation and management. Final Clinical Impressions(s) / ED Diagnoses   Final diagnoses:  Syncope    ED Discharge Orders    None       Mercy Riding, MD 01/27/18 [place], [place], DO 01/27/18 2344

## 2018-01-28 ENCOUNTER — Other Ambulatory Visit: Payer: Self-pay

## 2018-01-28 ENCOUNTER — Observation Stay (HOSPITAL_COMMUNITY): Payer: PPO

## 2018-01-28 DIAGNOSIS — I10 Essential (primary) hypertension: Secondary | ICD-10-CM | POA: Diagnosis not present

## 2018-01-28 DIAGNOSIS — N179 Acute kidney failure, unspecified: Secondary | ICD-10-CM

## 2018-01-28 DIAGNOSIS — R55 Syncope and collapse: Secondary | ICD-10-CM | POA: Diagnosis not present

## 2018-01-28 LAB — COMPREHENSIVE METABOLIC PANEL
ALBUMIN: 3.2 g/dL — AB (ref 3.5–5.0)
ALK PHOS: 34 U/L — AB (ref 38–126)
ALT: 33 U/L (ref 17–63)
AST: 29 U/L (ref 15–41)
Anion gap: 9 (ref 5–15)
BILIRUBIN TOTAL: 0.7 mg/dL (ref 0.3–1.2)
BUN: 24 mg/dL — AB (ref 6–20)
CALCIUM: 8.4 mg/dL — AB (ref 8.9–10.3)
CO2: 20 mmol/L — AB (ref 22–32)
Chloride: 109 mmol/L (ref 101–111)
Creatinine, Ser: 1.16 mg/dL (ref 0.61–1.24)
GFR calc Af Amer: >60 mL/min (ref >60)
GFR calc non Af Amer: >60 mL/min (ref >60)
GLUCOSE: 110 mg/dL — AB (ref 65–99)
POTASSIUM: 4.3 mmol/L (ref 3.5–5.1)
SODIUM: 138 mmol/L (ref 135–145)
Total Protein: 5.2 g/dL — ABNORMAL LOW (ref 6.5–8.1)

## 2018-01-28 LAB — CORTISOL: Cortisol, Plasma: 8.9 ug/dL

## 2018-01-28 LAB — URINALYSIS, ROUTINE W REFLEX MICROSCOPIC
Bilirubin Urine: NEGATIVE
GLUCOSE, UA: NEGATIVE mg/dL
Hgb urine dipstick: NEGATIVE
KETONES UR: NEGATIVE mg/dL
LEUKOCYTES UA: NEGATIVE
NITRITE: NEGATIVE
PH: 5 (ref 5.0–8.0)
Protein, ur: NEGATIVE mg/dL
SPECIFIC GRAVITY, URINE: 1.018 (ref 1.005–1.030)

## 2018-01-28 LAB — CBC
HEMATOCRIT: 35.4 % — AB (ref 39.0–52.0)
Hemoglobin: 11.9 g/dL — ABNORMAL LOW (ref 13.0–17.0)
MCH: 33 pg (ref 26.0–34.0)
MCHC: 33.6 g/dL (ref 30.0–36.0)
MCV: 98.1 fL (ref 78.0–100.0)
Platelets: 170 10*3/uL (ref 150–400)
RBC: 3.61 MIL/uL — ABNORMAL LOW (ref 4.22–5.81)
RDW: 14 % (ref 11.5–15.5)
WBC: 7.1 10*3/uL (ref 4.0–10.5)

## 2018-01-28 LAB — MRSA PCR SCREENING: MRSA by PCR: POSITIVE — AB

## 2018-01-28 LAB — TROPONIN I
Troponin I: <0.03 ng/mL (ref <0.03)
Troponin I: <0.03 ng/mL (ref <0.03)
Troponin I: <0.03 ng/mL (ref <0.03)

## 2018-01-28 MED ORDER — CHLORHEXIDINE GLUCONATE CLOTH 2 % EX PADS
6.0000 | MEDICATED_PAD | Freq: Every day | CUTANEOUS | Status: DC
  Administered 2018-01-29: 6 via TOPICAL

## 2018-01-28 MED ORDER — ENOXAPARIN SODIUM 40 MG/0.4ML ~~LOC~~ SOLN
40.0000 mg | Freq: Every day | SUBCUTANEOUS | Status: DC
  Administered 2018-01-28 – 2018-01-29 (×2): 40 mg via SUBCUTANEOUS
  Filled 2018-01-28 (×3): qty 0.4

## 2018-01-28 MED ORDER — ENOXAPARIN SODIUM 30 MG/0.3ML ~~LOC~~ SOLN: 30.0000 mg | SUBCUTANEOUS | Status: DC

## 2018-01-28 MED ORDER — TAMSULOSIN HCL 0.4 MG PO CAPS
0.4000 mg | ORAL_CAPSULE | Freq: Every day | ORAL | Status: DC
  Administered 2018-01-28 (×2): 0.4 mg via ORAL
  Filled 2018-01-28 (×2): qty 1

## 2018-01-28 MED ORDER — FOLIC ACID 1 MG PO TABS
1.0000 mg | ORAL_TABLET | Freq: Every day | ORAL | Status: DC
  Administered 2018-01-28 – 2018-01-29 (×2): 1 mg via ORAL
  Filled 2018-01-28 (×2): qty 1

## 2018-01-28 MED ORDER — VITAMIN D 1000 UNITS PO TABS
1000.0000 [IU] | ORAL_TABLET | ORAL | Status: DC
  Administered 2018-01-28 – 2018-01-29 (×2): 1000 [IU] via ORAL
  Filled 2018-01-28 (×2): qty 1

## 2018-01-28 MED ORDER — MUPIROCIN 2 % EX OINT
1.0000 "application " | TOPICAL_OINTMENT | Freq: Two times a day (BID) | CUTANEOUS | Status: DC
  Administered 2018-01-28 – 2018-01-29 (×2): 1 via NASAL
  Filled 2018-01-28: qty 22

## 2018-01-28 NOTE — ED Notes (Signed)
Heart healthy breakfast tray ordered 

## 2018-01-28 NOTE — ED Notes (Signed)
Patient transported to CT 

## 2018-01-28 NOTE — ED Notes (Signed)
Wife at bedside given a recliner.

## 2018-01-28 NOTE — ED Notes (Signed)
Pt transferred to hospital bed for comfort.

## 2018-01-28 NOTE — Progress Notes (Signed)
Patient ID: Julian Pineda, male   DOB: May 11, 1946, 72 y.o.   MRN: [id number]  PROGRESS NOTE    Julian Pineda  [id number] DOB: 05-03-1946 DOA: 01/27/2018   PCP: Leone Haven, MD    Outpatient Specialists:   Brief Narrative: Patient is a 72 y.o. male, w rheumatoid arthritis, prostate cancer apparently was in the passenger seat driving home after supper and suddenly felt lightheaded and passed out. Patient was admitted with syncope then and is being worked up.   Assessment & Plan:   Principal Problem:   Syncope Active Problems:   Anemia   #1 syncope: Patient is not orthostatic.  Cardiac enzymes so far negative.  Head CT without contrast is negative.  Carotid Dopplers and echocardiogram pending.  Continue telemetry at least 24 hours.  #2 hypotension: blood pressure has rebounded and is stable now.  Most likely cause of syncope.  Hold losartan.  #3 history of hypertension: Patient had low blood pressure.  Losartan on hold.  #4 history of prostate cancer: Stable.  Follow-up with oncology.  #5 acute kidney injury: Most likely prerenal.  Creatinine is now 1.1 from 1.3.  DVT prophylaxis: Lovenox  Code Status: Full  Family Communication: Wife with patient updated  Disposition Plan:  Home   Consultants:   None  Procedures: Pending  Antimicrobials: None  Subjective: Patient has no new complaints.  No more syncope.  He appears stable at this point  Objective: Vitals:   01/28/18 1245 01/28/18 1315 01/28/18 1345 01/28/18 1400  BP: 137/73 (!) 143/88 125/67 132/68  Pulse: 68 78 72 75  Resp: 17 20 (!) 21 19  Temp:    98 F (36.7 C)  TempSrc:      SpO2: 96% 98% 94% 96%  Weight:    95.1 kg (209 lb 10.5 oz)  Height:    5\' 10"  (1.778 m)    Intake/Output Summary (Last 24 hours) at 01/28/2018 1445 Last data filed at 01/28/2018 1400 Gross per 24 hour  Intake 1299 ml  Output -  Net 1299 ml   Filed Weights   01/27/18 2039 01/28/18 1400  Weight: 90.7 kg (200  lb) 95.1 kg (209 lb 10.5 oz)    Examination:  General exam: Appears calm and comfortable  Respiratory system: Clear to auscultation. Respiratory effort normal. Cardiovascular system: S1 & S2 heard, RRR. No JVD, murmurs, rubs, gallops or clicks. No pedal edema. Gastrointestinal system: Abdomen is nondistended, soft and nontender. No organomegaly or masses felt. Normal bowel sounds heard. Central nervous system: Alert and oriented. No focal neurological deficits. Extremities: Symmetric 5 x 5 power. Skin: No rashes, lesions or ulcers Psychiatry: Judgement and insight appear normal. Mood & affect appropriate.     Data Reviewed: I have personally reviewed following labs and imaging studies  CBC: Recent Labs  Lab 01/27/18 2047 01/28/18 0225  WBC 8.5 7.1  NEUTROABS 7.5  --   HGB 12.0* 11.9*  HCT 35.3* 35.4*  MCV 98.1 98.1  PLT 197 673   Basic Metabolic Panel: Recent Labs  Lab 01/27/18 2047 01/28/18 0225  NA 137 138  K 3.7 4.3  CL 105 109  CO2 21* 20*  GLUCOSE 109* 110*  BUN 25* 24*  CREATININE 1.38* 1.16  CALCIUM 8.6* 8.4*   GFR: Estimated Creatinine Clearance: 68.6 mL/min (by C-G formula based on SCr of 1.16 mg/dL). Liver Function Tests: Recent Labs  Lab 01/27/18 2047 01/28/18 0225  AST 37 29  ALT 38 33  ALKPHOS 41 34*  BILITOT  0.7 0.7  PROT 5.7* 5.2*  ALBUMIN 3.5 3.2*   No results for input(s): LIPASE, AMYLASE in the last 168 hours. No results for input(s): AMMONIA in the last 168 hours. Coagulation Profile: No results for input(s): INR, PROTIME in the last 168 hours. Cardiac Enzymes: Recent Labs  Lab 01/27/18 2047 01/28/18 0023 01/28/18 0225 01/28/18 1151  TROPONINI <0.03 <0.03 <0.03 <0.03   BNP (last 3 results) No results for input(s): PROBNP in the last 8760 hours. HbA1C: No results for input(s): HGBA1C in the last 72 hours. CBG: No results for input(s): GLUCAP in the last 168 hours. Lipid Profile: No results for input(s): CHOL, HDL,  LDLCALC, TRIG, CHOLHDL, LDLDIRECT in the last 72 hours. Thyroid Function Tests: No results for input(s): TSH, T4TOTAL, FREET4, T3FREE, THYROIDAB in the last 72 hours. Anemia Panel: No results for input(s): VITAMINB12, FOLATE, FERRITIN, TIBC, IRON, RETICCTPCT in the last 72 hours. Urine analysis:    Component Value Date/Time   COLORURINE YELLOW 01/28/2018 0351   APPEARANCEUR CLEAR 01/28/2018 0351   APPEARANCEUR Clear 06/01/2017 1425   LABSPEC 1.018 01/28/2018 0351   PHURINE 5.0 01/28/2018 0351   GLUCOSEU NEGATIVE 01/28/2018 0351   HGBUR NEGATIVE 01/28/2018 0351   BILIRUBINUR NEGATIVE 01/28/2018 0351   BILIRUBINUR Negative 06/01/2017 1425   KETONESUR NEGATIVE 01/28/2018 0351   PROTEINUR NEGATIVE 01/28/2018 0351   UROBILINOGEN 0.2 11/29/2012 0845   NITRITE NEGATIVE 01/28/2018 0351   LEUKOCYTESUR NEGATIVE 01/28/2018 0351   LEUKOCYTESUR Negative 06/01/2017 1425   Sepsis Labs: @LABRCNTIP (procalcitonin:4,lacticidven:4)  )No results found for this or any previous visit (from the past 240 hour(s)).       Radiology Studies: Dg Chest 2 View  Result Date: 01/27/2018 CLINICAL DATA:  Syncope. EXAM: CHEST - 2 VIEW COMPARISON:  Radiographs of April 29, 2017. FINDINGS: The heart size and mediastinal contours are within normal limits. No pneumothorax or pleural effusion is noted. Right lung is clear. Stable left basilar scarring is noted. No acute pulmonary disease is noted. The visualized skeletal structures are unremarkable. IMPRESSION: No active cardiopulmonary disease. Electronically Signed   By: Marijo Conception, M.D.   On: 01/27/2018 21:53   Ct Head Wo Contrast  Result Date: 01/28/2018 CLINICAL DATA:  72 year old male with syncope. EXAM: CT HEAD WITHOUT CONTRAST TECHNIQUE: Contiguous axial images were obtained from the base of the skull through the vertex without intravenous contrast. COMPARISON:  Head CT dated 03/30/2006 FINDINGS: Brain: There is mild age-related atrophy and chronic  microvascular ischemic changes. There is no acute intracranial hemorrhage. No mass effect or midline shift. No extra-axial fluid collection. Vascular: No hyperdense vessel or unexpected calcification. Skull: Normal. Negative for fracture or focal lesion. Sinuses/Orbits: No acute finding. Other: None IMPRESSION: 1. No acute intracranial pathology. 2. Mild age-related atrophy and chronic microvascular ischemic changes. Electronically Signed   By: Anner Crete M.D.   On: 01/28/2018 01:21        Scheduled Meds: . cholecalciferol  1,000 Units Oral BH-[id number]  . enoxaparin (LOVENOX) injection  40 mg Subcutaneous Daily  . folic acid  1 mg Oral Daily  . tamsulosin  0.4 mg Oral QPC supper   Continuous Infusions:   LOS: 0 days    Time spent: 68 minutes    Shemeika Starzyk,LAWAL, MD [facility] Pager [phone]  If 7PM-7AM, please contact night-coverage [email or web address] Password [email or web address] 01/28/2018, 2:45 PM

## 2018-01-29 ENCOUNTER — Observation Stay (HOSPITAL_COMMUNITY): Payer: PPO

## 2018-01-29 DIAGNOSIS — N178 Other acute kidney failure: Secondary | ICD-10-CM | POA: Diagnosis not present

## 2018-01-29 DIAGNOSIS — I10 Essential (primary) hypertension: Secondary | ICD-10-CM | POA: Diagnosis not present

## 2018-01-29 DIAGNOSIS — R55 Syncope and collapse: Secondary | ICD-10-CM | POA: Diagnosis not present

## 2018-01-29 LAB — CBC WITH DIFFERENTIAL/PLATELET
BASOS ABS: 0 10*3/uL (ref 0.0–0.1)
BASOS PCT: 1 %
EOS ABS: 0.2 10*3/uL (ref 0.0–0.7)
EOS PCT: 4 %
HCT: 38.8 % — ABNORMAL LOW (ref 39.0–52.0)
Hemoglobin: 12.7 g/dL — ABNORMAL LOW (ref 13.0–17.0)
LYMPHS PCT: 20 %
Lymphs Abs: 0.8 10*3/uL (ref 0.7–4.0)
MCH: 32.3 pg (ref 26.0–34.0)
MCHC: 32.7 g/dL (ref 30.0–36.0)
MCV: 98.7 fL (ref 78.0–100.0)
Monocytes Absolute: 0.2 10*3/uL (ref 0.1–1.0)
Monocytes Relative: 5 %
Neutro Abs: 2.8 10*3/uL (ref 1.7–7.7)
Neutrophils Relative %: 70 %
Platelets: 198 10*3/uL (ref 150–400)
RBC: 3.93 MIL/uL — AB (ref 4.22–5.81)
RDW: 13.9 % (ref 11.5–15.5)
WBC: 3.9 10*3/uL — AB (ref 4.0–10.5)

## 2018-01-29 LAB — BASIC METABOLIC PANEL
ANION GAP: 6 (ref 5–15)
BUN: 12 mg/dL (ref 6–20)
CO2: 27 mmol/L (ref 22–32)
Calcium: 9.1 mg/dL (ref 8.9–10.3)
Chloride: 104 mmol/L (ref 101–111)
Creatinine, Ser: 1.18 mg/dL (ref 0.61–1.24)
Glucose, Bld: 96 mg/dL (ref 65–99)
POTASSIUM: 4 mmol/L (ref 3.5–5.1)
SODIUM: 137 mmol/L (ref 135–145)

## 2018-01-29 LAB — ECHOCARDIOGRAM COMPLETE
Height: 70 in
WEIGHTICAEL: 3298.08 [oz_av]

## 2018-01-29 MED ORDER — MUPIROCIN 2 % EX OINT: 1.0000 "application " | TOPICAL_OINTMENT | Freq: Two times a day (BID) | CUTANEOUS | 0 refills | Status: DC

## 2018-01-29 NOTE — Care Management Obs Status (Signed)
[place] NOTIFICATION   Patient Details  Name: Julian Pineda MRN: [id number] Date of Birth: [birth year]   Medicare Observation Status Notification Given:  Yes    Claudie Leach, RN 01/29/2018, 11:03 AM

## 2018-01-29 NOTE — Discharge Summary (Signed)
Physician Discharge Summary  Julian Pineda RSW:[id number] DOB: [birth year] DOA: 01/27/2018  PCP: Leone Haven, MD  Admit date: 01/27/2018 Discharge date: 01/29/2018  Time spent: 32 minutes  Recommendations for Outpatient Follow-up:  1. Stay  Hydrated 2. Follow-up with PCP  Discharge Diagnoses:  Principal Problem:   Syncope Active Problems:   Hypertension   Anemia   ARF (acute renal failure) ([place])   Discharge Condition: Good  Diet recommendation: Regular  Filed Weights   01/27/18 2039 01/28/18 1400 01/29/18 0630  Weight: 90.7 kg (200 lb) 95.1 kg (209 lb 10.5 oz) 93.5 kg (206 lb 2.1 oz)    History of present illness:  Julian Pineda  is a 72 y.o. male, w rheumatoid arthritis, prostate cancer apparently was in the passenger seat driving home after supper and suddenly felt lightheaded and passed out. Pt denies presyncopal symptoms other than feeling slightly lightheaded. Pt was out for a few minutes and had n/v x1. No bloody emesis.   Pt denies cp, palp, sob, starring spell, focal weakness, numbness, tingling.    Per EMS initial bp 60/40.  Pt received 1L ns iv x1    Hospital Course:  Patient was evaluated in the ER was mildly orthostatic.  He was admitted and hydrated with IV saline.  Workup included head CT without contrast, MRI of the brain, carotid Dopplers all came back negative.  It appeared patient had syncopal episode secondary to orthostasis.  He did very well and no other issues.  His blood pressure rebounded and was fine.  On admission he had acute kidney injury.  Which was corrected overnight with IV fluids.  Patient has been counseled to keep himself hydrated him follow-up with PCP  Procedures:  Echocardiogram,   carotid Dopplers   Consultations:  None  Discharge Exam: Vitals:   01/29/18 0817 01/29/18 1107  BP: 123/66 137/84  Pulse: 69 70  Resp: (!) 22 (!) 22  Temp: 98.6 F (37 C) 98.5 F (36.9 C)  SpO2: 98% 98%    General: Stable,  NAD Cardiovascular: RRR Respiratory: Good AE bilaterally no wheeze or rales  Discharge Instructions   Discharge Instructions    Diet - low sodium heart healthy   Complete by:  As directed    Increase activity slowly   Complete by:  As directed      Allergies as of 01/29/2018      Reactions   Penicillins Other (See Comments)   Unknown childhood reaction      Medication List    TAKE these medications   cholecalciferol 1000 units tablet Commonly known as:  VITAMIN D Take 1,000 Units by mouth every morning.   folic acid 1 MG tablet Commonly known as:  FOLVITE TAKE 1 TABLET EVERY DAY   losartan 100 MG tablet Commonly known as:  COZAAR TAKE 1 TABLET DAILY   meloxicam 15 MG tablet Commonly known as:  MOBIC TAKE 1 TABLET BY MOUTH ONCE DAILY WITH FOOD   methotrexate 50 MG/2ML injection INJECT 1ML WEEKLY   metroNIDAZOLE 0.75 % cream Commonly known as:  METROCREAM Apply 1 application topically daily.   mupirocin ointment 2 % Commonly known as:  BACTROBAN Place 1 application into the nose 2 (two) times daily.   tamsulosin 0.4 MG Caps capsule Commonly known as:  FLOMAX Take 1 capsule (0.4 mg total) by mouth daily after supper.      Allergies  Allergen Reactions  . Penicillins Other (See Comments)    Unknown childhood reaction  The results of significant diagnostics from this hospitalization (including imaging, microbiology, ancillary and laboratory) are listed below for reference.    Significant Diagnostic Studies: Dg Chest 2 View  Result Date: 01/27/2018 CLINICAL DATA:  Syncope. EXAM: CHEST - 2 VIEW COMPARISON:  Radiographs of April 29, 2017. FINDINGS: The heart size and mediastinal contours are within normal limits. No pneumothorax or pleural effusion is noted. Right lung is clear. Stable left basilar scarring is noted. No acute pulmonary disease is noted. The visualized skeletal structures are unremarkable. IMPRESSION: No active cardiopulmonary disease.  Electronically Signed   By: Marijo Conception, M.D.   On: 01/27/2018 21:53   Ct Head Wo Contrast  Result Date: 01/28/2018 CLINICAL DATA:  72 year old male with syncope. EXAM: CT HEAD WITHOUT CONTRAST TECHNIQUE: Contiguous axial images were obtained from the base of the skull through the vertex without intravenous contrast. COMPARISON:  Head CT dated 03/30/2006 FINDINGS: Brain: There is mild age-related atrophy and chronic microvascular ischemic changes. There is no acute intracranial hemorrhage. No mass effect or midline shift. No extra-axial fluid collection. Vascular: No hyperdense vessel or unexpected calcification. Skull: Normal. Negative for fracture or focal lesion. Sinuses/Orbits: No acute finding. Other: None IMPRESSION: 1. No acute intracranial pathology. 2. Mild age-related atrophy and chronic microvascular ischemic changes. Electronically Signed   By: Anner Crete M.D.   On: 01/28/2018 01:21    Microbiology: Recent Results (from the past 240 hour(s))  MRSA PCR Screening     Status: Abnormal   Collection Time: 01/28/18  2:00 PM  Result Value Ref Range Status   MRSA by PCR POSITIVE (A) NEGATIVE Final    Comment:        The GeneXpert MRSA Assay (FDA approved for NASAL specimens only), is one component of a comprehensive MRSA colonization surveillance program. It is not intended to diagnose MRSA infection nor to guide or monitor treatment for MRSA infections. RESULT CALLED TO, READ BACK BY AND VERIFIED WITH: Laurene Footman AT 2003 01/28/18 BY L BENFIELD Performed at [facility], [place] [address], [place], [place] [address]      Labs: Basic Metabolic Panel: Recent Labs  Lab 01/27/18 2047 01/28/18 0225 01/29/18 0908  NA 137 138 137  K 3.7 4.3 4.0  CL 105 109 104  CO2 21* 20* 27  GLUCOSE 109* 110* 96  BUN 25* 24* 12  CREATININE 1.38* 1.16 1.18  CALCIUM 8.6* 8.4* 9.1   Liver Function Tests: Recent Labs  Lab 01/27/18 2047 01/28/18 0225  AST 37 29  ALT 38 33   ALKPHOS 41 34*  BILITOT 0.7 0.7  PROT 5.7* 5.2*  ALBUMIN 3.5 3.2*   No results for input(s): LIPASE, AMYLASE in the last 168 hours. No results for input(s): AMMONIA in the last 168 hours. CBC: Recent Labs  Lab 01/27/18 2047 01/28/18 0225 01/29/18 0908  WBC 8.5 7.1 3.9*  NEUTROABS 7.5  --  2.8  HGB 12.0* 11.9* 12.7*  HCT 35.3* 35.4* 38.8*  MCV 98.1 98.1 98.7  PLT 197 170 198   Cardiac Enzymes: Recent Labs  Lab 01/27/18 2047 01/28/18 0023 01/28/18 0225 01/28/18 1151  TROPONINI <0.03 <0.03 <0.03 <0.03   BNP: BNP (last 3 results) No results for input(s): BNP in the last 8760 hours.  ProBNP (last 3 results) No results for input(s): PROBNP in the last 8760 hours.  CBG: No results for input(s): GLUCAP in the last 168 hours.     SignedBarbette Merino MD.  [facility] 01/29/2018, 2:31 PM

## 2018-01-31 ENCOUNTER — Telehealth: Payer: Self-pay

## 2018-01-31 NOTE — Telephone Encounter (Signed)
Transition Care Management Follow-up Telephone Call   Date discharged? 01/29/18   How have you been since you were released from the hospital?  Doing well.  No syncope.  No falls.  Eating and drinking without issues.  Ambulating and exercising without issues.     Do you understand why you were in the hospital? Syncope.   Do you understand the discharge instructions? Yes, increase activity slowly.  Follow up with PCP.   Where were you discharged to? Home.   Items Reviewed:  Medications reviewed: Yes  Allergies reviewed: Yes, penicillins  Dietary changes reviewed: Low sodium diet, heart healthy diet  Referrals reviewed: Yes. None to follow up with at this time.   Functional Questionnaire:   Activities of Daily Living (ADLs):   He states they are independent in the following: Independent in all ADLs States they require assistance with the following: No assistance required.     Any transportation issues/concerns?: None.   Any patient concerns? None.   Confirmed importance and date/time of follow-up visits scheduled Yes, appointment scheduled 02/10/18 at 1130.    Provider Appointment booked with Dr. Caryl Bis, PCP  Confirmed with patient if condition begins to worsen call PCP or go to the ER.  Patient was given the office number and encouraged to call back with question or concerns.  : Yes

## 2018-02-10 ENCOUNTER — Ambulatory Visit (INDEPENDENT_AMBULATORY_CARE_PROVIDER_SITE_OTHER): Payer: PPO | Admitting: Family Medicine

## 2018-02-10 ENCOUNTER — Encounter: Payer: Self-pay | Admitting: Family Medicine

## 2018-02-10 VITALS — BP 130/80 | HR 82 | Temp 98.3°F | Resp 17 | Ht 70.0 in | Wt 204.5 lb

## 2018-02-10 DIAGNOSIS — N178 Other acute kidney failure: Secondary | ICD-10-CM

## 2018-02-10 DIAGNOSIS — E559 Vitamin D deficiency, unspecified: Secondary | ICD-10-CM

## 2018-02-10 DIAGNOSIS — D649 Anemia, unspecified: Secondary | ICD-10-CM

## 2018-02-10 DIAGNOSIS — R55 Syncope and collapse: Secondary | ICD-10-CM

## 2018-02-10 NOTE — Progress Notes (Signed)
  Tommi Rumps, MD Phone: [phone]  Julian Pineda is a 72 y.o. male who presents today for f/u.  Patient was hospitalized from 01/27/18-01/29/18 for syncope.  He was sitting in the car when he started to feel weak and lightheaded with a little bit of light sensitivity and then passed out.  He had no chest pain, shortness of breath, palpitations, or edema.  He did vomit.  He was cold and clammy.  He had no seizure activity.  He does note he had had 3 alcoholic beverages and had not had much water to drink prior to the event.  EMS found his blood pressure to be 60/40.  They gave him fluid.  He was evaluated at the hospital and found to have acute kidney injury.  He was not orthostatic though it appears that they stated his syncope was related to his low blood pressure.  He does note some slight dyspnea with some activities.  He notes he has done this in the past with the syncope and did see cardiology a number of years ago.  He was minimally anemic.  He did have AKI though this improved prior to discharge.  Discharge summary reviewed.  Medications reviewed.  Social History   Tobacco Use  Smoking Status Never Smoker  Smokeless Tobacco Former Systems developer  . Types: Chew     ROS see history of present illness  Objective  Physical Exam Vitals:   02/10/18 1139  BP: 130/80  Pulse: 82  Resp: 17  Temp: 98.3 F (36.8 C)  SpO2: 98%    BP Readings from Last 3 Encounters:  02/10/18 130/80  01/29/18 137/84  01/26/18 140/82   Wt Readings from Last 3 Encounters:  02/10/18 204 lb 8 oz (92.8 kg)  01/29/18 206 lb 2.1 oz (93.5 kg)  01/26/18 209 lb 12.8 oz (95.2 kg)    Physical Exam  Constitutional: No distress.  Cardiovascular: Normal rate, regular rhythm and normal heart sounds.  Pulmonary/Chest: Effort normal and breath sounds normal.  Musculoskeletal: He exhibits no edema.  Neurological: He is alert.  Skin: Skin is warm and dry. He is not diaphoretic.     Assessment/Plan: Please see  individual problem list.  Syncope Syncopal episode.  I do not think this is related to orthostasis though could be related to his low blood pressure.  It is odd to me that he would have a syncopal episode while just sitting there.  Could be cardiogenic with an arrhythmia as a cause.  He has not had any recurrence.  Will refer to cardiology for further evaluation.  He will stay well-hydrated particularly if drinking alcohol.  He is given return precautions.  ARF (acute renal failure) ([place]) Likely related to dehydration.  We will recheck.  He was given a written prescription for labs to be done at [facility].  Anemia Likely related to dilution after receiving fluids.  We will recheck.  Written prescription for labs given.  Vitamin D deficiency disease Written prescription given for recheck.   Orders Placed This Encounter  Procedures  . Ambulatory referral to Cardiology    Referral Priority:   Routine    Referral Type:   Consultation    Referral Reason:   Specialty Services Required    Requested Specialty:   Cardiology    Number of Visits Requested:   1    No orders of the defined types were placed in this encounter.    Tommi Rumps, MD [place]

## 2018-02-10 NOTE — Assessment & Plan Note (Signed)
Written prescription given for recheck.

## 2018-02-10 NOTE — Assessment & Plan Note (Signed)
Likely related to dilution after receiving fluids.  We will recheck.  Written prescription for labs given.

## 2018-02-10 NOTE — Assessment & Plan Note (Signed)
Syncopal episode.  I do not think this is related to orthostasis though could be related to his low blood pressure.  It is odd to me that he would have a syncopal episode while just sitting there.  Could be cardiogenic with an arrhythmia as a cause.  He has not had any recurrence.  Will refer to cardiology for further evaluation.  He will stay well-hydrated particularly if drinking alcohol.  He is given return precautions.

## 2018-02-10 NOTE — Assessment & Plan Note (Signed)
Likely related to dehydration.  We will recheck.  He was given a written prescription for labs to be done at [facility].

## 2018-02-10 NOTE — Patient Instructions (Signed)
Nice to see you. We will get you referred to cardiology. Please get the lab work done. If you have recurrence of your syncope please be evaluated. Please try to stay well-hydrated.

## 2018-02-21 LAB — HEMOGLOBIN A1C: Hemoglobin A1C: 5.4

## 2018-02-21 LAB — CBC AND DIFFERENTIAL
HEMATOCRIT: 41 (ref 41–53)
Hemoglobin: 14.1 (ref 13.5–17.5)
NEUTROS ABS: 3
PLATELETS: 272 (ref 150–399)
WBC: 4.6

## 2018-02-21 LAB — HEPATIC FUNCTION PANEL
ALT: 23 (ref 10–40)
AST: 18 (ref 14–40)
Alkaline Phosphatase: 46 (ref 25–125)
Bilirubin, Total: 0.4

## 2018-02-21 LAB — BASIC METABOLIC PANEL
BUN: 21 (ref 4–21)
Creatinine: 0.9 (ref 0.6–1.3)
GLUCOSE: 95
Potassium: 4.5 (ref 3.4–5.3)
Sodium: 141 (ref 137–147)

## 2018-02-21 LAB — IRON,TIBC AND FERRITIN PANEL: Iron: 66

## 2018-02-21 LAB — LIPID PANEL
CHOLESTEROL: 183 (ref 0–200)
HDL: 71 — AB (ref 35–70)
LDL CALC: 98
Triglycerides: 68 (ref 40–160)

## 2018-02-21 LAB — TSH: TSH: 4.02 (ref 0.41–5.90)

## 2018-02-21 LAB — VITAMIN D 25 HYDROXY (VIT D DEFICIENCY, FRACTURES): VIT D 25 HYDROXY: 33

## 2018-03-01 ENCOUNTER — Encounter: Payer: Self-pay | Admitting: Family Medicine

## 2018-03-01 ENCOUNTER — Telehealth: Payer: Self-pay

## 2018-03-01 NOTE — Telephone Encounter (Signed)
Received lab results form patient, per Dr.Sonnenberg I have informed patient that lab results are acceptable.

## 2018-03-22 DIAGNOSIS — Z85828 Personal history of other malignant neoplasm of skin: Secondary | ICD-10-CM | POA: Diagnosis not present

## 2018-03-22 DIAGNOSIS — D485 Neoplasm of uncertain behavior of skin: Secondary | ICD-10-CM | POA: Diagnosis not present

## 2018-03-22 DIAGNOSIS — Z08 Encounter for follow-up examination after completed treatment for malignant neoplasm: Secondary | ICD-10-CM | POA: Diagnosis not present

## 2018-03-22 DIAGNOSIS — X32XXXA Exposure to sunlight, initial encounter: Secondary | ICD-10-CM | POA: Diagnosis not present

## 2018-03-22 DIAGNOSIS — D045 Carcinoma in situ of skin of trunk: Secondary | ICD-10-CM | POA: Diagnosis not present

## 2018-03-22 DIAGNOSIS — L570 Actinic keratosis: Secondary | ICD-10-CM | POA: Diagnosis not present

## 2018-03-28 DIAGNOSIS — E663 Overweight: Secondary | ICD-10-CM | POA: Diagnosis not present

## 2018-03-28 DIAGNOSIS — Z79899 Other long term (current) drug therapy: Secondary | ICD-10-CM | POA: Diagnosis not present

## 2018-03-28 DIAGNOSIS — R5382 Chronic fatigue, unspecified: Secondary | ICD-10-CM | POA: Diagnosis not present

## 2018-03-28 DIAGNOSIS — Z6829 Body mass index (BMI) 29.0-29.9, adult: Secondary | ICD-10-CM | POA: Diagnosis not present

## 2018-03-28 DIAGNOSIS — M19049 Primary osteoarthritis, unspecified hand: Secondary | ICD-10-CM | POA: Diagnosis not present

## 2018-03-28 DIAGNOSIS — M0589 Other rheumatoid arthritis with rheumatoid factor of multiple sites: Secondary | ICD-10-CM | POA: Diagnosis not present

## 2018-04-01 NOTE — Progress Notes (Signed)
Cardiology Office Note  Date:  04/03/2018   ID:  Julian Pineda, DOB 1946/01/30, MRN [id number]  PCP:  Julian Haven, MD   Chief Complaint  Patient presents with  . other    Ref by Dr. Caryl Bis for syncope. Meds reviewed by the pt. verbally. Denies chest pain nor shortness of breath.     HPI:  Mr. Julian Pineda is a 71 or generally past medical history of Syncope   06/2016 and 12/2017 Who presents by referral from Dr. Caryl Bis for consultation of his recent episode of syncope  Long discussion concerning previous episodes Episode of syncope 06/2016 syncope 2 after playing 18 holes of golf in th heat Was dehydrated, CR 1.7, BUN 24  Has had several episodes of not feeling right since that time Often associated with upset stomach  drinking alcohol  In The setting of dehydration   hospitalized from 01/27/18-01/29/18 for syncope.  The details of this event discussed with him Was out to eat, did not eat much, did not feel well he had had 3 alcoholic beverages   sitting in the car when he started to feel weak and lightheaded with a little bit of light sensitivity Vomited and  passed out.   cold and clammy.  had not had much water to drink prior to the event.    EMS found his blood pressure to be 60/40.  They gave him fluid.   Wife reports that they rolled him on his side and he recovered relatively quickly and wanted to stand up  Echo 01/29/2018 Normal EF  No further episodes since that time  EKG personally reviewed by myself on todays visit Shows normal sinus rhythm rate 64 bpm no significant ST or T-wave changes   PMH:   has a past medical history of Diverticulosis of colon, GERD (gastroesophageal reflux disease), History of basal cell carcinoma (BCC) excision, History of squamous cell carcinoma excision, Hypertension, Nocturia, OA (osteoarthritis), Prostate cancer [facility] (urologist-  dr eskidge/  oncologist- dr [name]), Rheumatoid arteritis, Rosacea, and Vitamin D  deficiency.  PSH:    Past Surgical History:  Procedure Laterality Date  . ACHILLES TENDON SURGERY Right 1984  . COLONOSCOPY  2008  . COLONOSCOPY WITH PROPOFOL N/A 11/09/2017   Procedure: COLONOSCOPY WITH PROPOFOL;  Surgeon: Julian Silvas, MD;  Location: [facility] ENDOSCOPY;  Service: Endoscopy;  Laterality: N/A;  . MOHS SURGERY  09-25-2014;  11-27-2013   [facility]   09-25-2014 right lower eyelid /  11-27-2013  left lateral cheek  . RADIOACTIVE SEED IMPLANT N/A 06/17/2017   Procedure: RADIOACTIVE SEED IMPLANT/BRACHYTHERAPY IMPLANT;  Surgeon: Julian Aloe, MD;  Location: [facility];  Service: Urology;  Laterality: N/A;  . TONSILLECTOMY  child  . TOTAL KNEE ARTHROPLASTY  12/04/2012   Procedure: TOTAL KNEE ARTHROPLASTY;  Surgeon: Vickey Huger, MD;  Location: [place];  Service: Orthopedics;  Laterality: Left;  left total knee arthroplasty  . VARICOSE VEIN SURGERY  2000   left leg    Current Outpatient Medications  Medication Sig Dispense Refill  . cholecalciferol (VITAMIN D) 1000 units tablet Take 1,000 Units by mouth every morning.     . folic acid (FOLVITE) 1 MG tablet TAKE 1 TABLET EVERY DAY 90 tablet 0  . losartan (COZAAR) 100 MG tablet TAKE 1 TABLET DAILY 90 tablet 1  . meloxicam (MOBIC) 15 MG tablet TAKE 1 TABLET BY MOUTH ONCE DAILY WITH FOOD 30 tablet 2  . methotrexate 50 MG/2ML injection INJECT 1ML WEEKLY 4 mL 6  .  metroNIDAZOLE (METROCREAM) 0.75 % cream Apply 1 application topically daily.     . mupirocin ointment (BACTROBAN) 2 % Place 1 application into the nose 2 (two) times daily. 22 g 0  . tamsulosin (FLOMAX) 0.4 MG CAPS capsule Take 1 capsule (0.4 mg total) by mouth daily after supper. 30 capsule 0   No current facility-administered medications for this visit.      Allergies:   Penicillins   Social History:  The patient  reports that he has never smoked. He quit smokeless tobacco use about 21 months ago. His smokeless tobacco use included chew. He reports that he  drinks about 8.4 oz of alcohol per week. He reports that he does not use drugs.   Family History:   family history includes Alcohol abuse in his father; Breast cancer in his daughter; Cancer in his mother; Prostate cancer in his brother.    Review of Systems: Review of Systems  Constitutional: Negative.   Respiratory: Negative.   Cardiovascular: Negative.   Gastrointestinal: Negative.   Musculoskeletal: Negative.   Neurological: Positive for loss of consciousness.  Psychiatric/Behavioral: Negative.   All other systems reviewed and are negative.    PHYSICAL EXAM: VS:  BP 134/72 (BP Location: Right Arm, Patient Position: Sitting, Cuff Size: Normal)   Pulse 64   Ht 5\' 10"  (1.778 m)   Wt 205 lb (93 kg)   BMI 29.41 kg/m  , BMI Body mass index is 29.41 kg/m. GEN: Well nourished, well developed, in no acute distress  HEENT: normal  Neck: no JVD, carotid bruits, or masses Cardiac: RRR; no murmurs, rubs, or gallops,no edema  Respiratory:  clear to auscultation bilaterally, normal work of breathing GI: soft, nontender, nondistended, + BS MS: no deformity or atrophy  Skin: warm and dry, no rash Neuro:  Strength and sensation are intact Psych: euthymic mood, full affect    Recent Labs: 02/21/2018: ALT 23; BUN 21; Creatinine 0.9; Hemoglobin 14.1; Platelets 272; Potassium 4.5; Sodium 141; TSH 4.02    Lipid Panel Lab Results  Component Value Date   CHOL 183 02/21/2018   HDL 71 (A) 02/21/2018   LDLCALC 98 02/21/2018   TRIG 68 02/21/2018      Wt Readings from Last 3 Encounters:  04/03/18 205 lb (93 kg)  02/10/18 204 lb 8 oz (92.8 kg)  01/29/18 206 lb 2.1 oz (93.5 kg)       ASSESSMENT AND PLAN:  Syncope, unspecified syncope type - Plan: EKG 12-Lead In symptoms concerning for vasovagal etiology We did offer event monitor, he was not particular eager Echocardiogram is completely in the hospital No strong indication for ischemic workup at this time as he's asymptomatic  and very sporadic episodes, 2 in the past several years Recommended he try to hydrate, avoid excessive alcohol in the setting of dehydration  for further episodes stress testing and event monitor could be considered  Acute renal failure with other specified pathological lesion in kidney [facility]) Previously noted 2017 in the setting of dehydration  recent lab work showing normal renal function  Malignant neoplasm of prostate ([place]) Managed by primary care  Essential hypertension Blood pressure is well controlled on today's visit. No changes made to the medications.   Disposition:   F/U  As needed   Total encounter time more than 60 minutes  Greater than 50% was spent in counseling and coordination of care with the patient    Orders Placed This Encounter  Procedures  . EKG 12-Lead     Signed,  Esmond Plants, M.D., Ph.D. 04/03/2018  [place], [place]

## 2018-04-03 ENCOUNTER — Ambulatory Visit (INDEPENDENT_AMBULATORY_CARE_PROVIDER_SITE_OTHER): Payer: PPO | Admitting: Cardiovascular Disease

## 2018-04-03 ENCOUNTER — Encounter: Payer: Self-pay | Admitting: Cardiovascular Disease

## 2018-04-03 VITALS — BP 134/72 | HR 64 | Ht 70.0 in | Wt 205.0 lb

## 2018-04-03 DIAGNOSIS — N178 Other acute kidney failure: Secondary | ICD-10-CM | POA: Diagnosis not present

## 2018-04-03 DIAGNOSIS — I10 Essential (primary) hypertension: Secondary | ICD-10-CM

## 2018-04-03 DIAGNOSIS — R55 Syncope and collapse: Secondary | ICD-10-CM

## 2018-04-03 DIAGNOSIS — C61 Malignant neoplasm of prostate: Secondary | ICD-10-CM

## 2018-04-03 NOTE — Patient Instructions (Signed)
Medication Instructions:   No medication changes made  Labwork:  No new labs needed  Testing/Procedures:  No further testing at this time  Please call if you would like a CT coronary calcium score Screening study $150  Call if you would like a monitor (ZIO patch) 2 week   Follow-Up: It was a pleasure seeing you in the office today. Please call us if you have new issues that need to be addressed before your next appt.  [phone]  Your physician wants you to follow-up in:  As needed  If you need a refill on your cardiac medications before your next appointment, please call your pharmacy.  For educational health videos Log in to : [email or web address] Or : [email or web address], password : [email or web address]

## 2018-04-17 DIAGNOSIS — D045 Carcinoma in situ of skin of trunk: Secondary | ICD-10-CM | POA: Diagnosis not present

## 2018-05-03 ENCOUNTER — Encounter: Payer: Self-pay | Admitting: Family Medicine

## 2018-05-15 ENCOUNTER — Encounter: Payer: Self-pay | Admitting: Family Medicine

## 2018-05-15 ENCOUNTER — Ambulatory Visit (INDEPENDENT_AMBULATORY_CARE_PROVIDER_SITE_OTHER): Payer: PPO | Admitting: Family Medicine

## 2018-05-15 DIAGNOSIS — R55 Syncope and collapse: Secondary | ICD-10-CM

## 2018-05-15 DIAGNOSIS — R5381 Other malaise: Secondary | ICD-10-CM | POA: Diagnosis not present

## 2018-05-15 DIAGNOSIS — I10 Essential (primary) hypertension: Secondary | ICD-10-CM | POA: Diagnosis not present

## 2018-05-15 DIAGNOSIS — C44319 Basal cell carcinoma of skin of other parts of face: Secondary | ICD-10-CM | POA: Diagnosis not present

## 2018-05-15 NOTE — Assessment & Plan Note (Signed)
No recurrence.  He has seen cardiology.

## 2018-05-15 NOTE — Progress Notes (Signed)
  Tommi Rumps, MD Phone: [phone]  Julian Pineda is a 72 y.o. male who presents today for f/u.  CC: htn, syncope, BCC  HYPERTENSION  Disease Monitoring  Home BP Monitoring not checking Chest pain- no    Dyspnea- no Medications  Compliance-  Taking losartan.  Edema- for a few days had some lower extremity edema several weeks ago, resolved after a couple days, he had been outside a lot and eaten salty food, no orthopnea or PND, has not recurred  Syncope: This has not recurred.  He did see cardiology and they talked about doing a event monitor though he opted against this.  The plan appears to have been to work up his syncope if it recurred.  Basal cell carcinoma: He has had a number of these removed recently.  He does see dermatology fairly frequently.  Patient notes for a fairly long time he has felt as though he cannot do quite as much physically particularly if he runs or gets on the treadmill though he lifts weights and plays golf with little issue.  He has no chest pain or shortness of breath.  This started in his 94s.  He just does not feel like he can do as much as he used to.  He has gotten away from activity a little bit.  He wonders if this could be his testosterone being low.  He does have a history of prostate cancer which would likely preclude testosterone supplementation if needed.     Social History   Tobacco Use  Smoking Status Never Smoker  Smokeless Tobacco Former Systems developer  . Types: Chew     ROS see history of present illness  Objective  Physical Exam Vitals:   05/15/18 1029  BP: 122/70  Pulse: 70  Temp: 98.3 F (36.8 C)  SpO2: 96%    BP Readings from Last 3 Encounters:  05/15/18 122/70  04/03/18 134/72  02/10/18 130/80   Wt Readings from Last 3 Encounters:  05/15/18 205 lb 12.8 oz (93.4 kg)  04/03/18 205 lb (93 kg)  02/10/18 204 lb 8 oz (92.8 kg)    Physical Exam  Constitutional: No distress.  Cardiovascular: Normal rate, regular rhythm  and normal heart sounds.  Pulmonary/Chest: Effort normal and breath sounds normal.  Musculoskeletal: He exhibits no edema.  Neurological: He is alert.  Skin: Skin is warm and dry. He is not diaphoretic.     Assessment/Plan: Please see individual problem list.  Hypertension Adequately controlled.  Continue current regimen.  Syncope No recurrence.  He has seen cardiology.  Basal cell carcinoma Continue to see dermatology.  Physical deconditioning Suspect physical deconditioning.  I did offer lab work for testosterone check and other labs though they would need to be done on another day before 10 AM.  He deferred at this time.  He will monitor.   No orders of the defined types were placed in this encounter.   No orders of the defined types were placed in this encounter.    Tommi Rumps, MD [place]

## 2018-05-15 NOTE — Patient Instructions (Addendum)
Nice to see you. Please keep an eye on your ability to do activity.  If it does not improve or worsens please let us know.

## 2018-05-15 NOTE — Assessment & Plan Note (Signed)
Adequately controlled.  Continue current regimen. 

## 2018-05-15 NOTE — Assessment & Plan Note (Signed)
Suspect physical deconditioning.  I did offer lab work for testosterone check and other labs though they would need to be done on another day before 10 AM.  He deferred at this time.  He will monitor.

## 2018-05-15 NOTE — Assessment & Plan Note (Signed)
Continue to see dermatology

## 2018-06-21 ENCOUNTER — Other Ambulatory Visit: Payer: Self-pay | Admitting: Family Medicine

## 2018-06-21 DIAGNOSIS — C61 Malignant neoplasm of prostate: Secondary | ICD-10-CM | POA: Diagnosis not present

## 2018-06-21 DIAGNOSIS — R350 Frequency of micturition: Secondary | ICD-10-CM | POA: Diagnosis not present

## 2018-07-31 ENCOUNTER — Ambulatory Visit: Payer: PPO | Admitting: Family Medicine

## 2018-08-18 DIAGNOSIS — C44719 Basal cell carcinoma of skin of left lower limb, including hip: Secondary | ICD-10-CM | POA: Diagnosis not present

## 2018-08-18 DIAGNOSIS — D485 Neoplasm of uncertain behavior of skin: Secondary | ICD-10-CM | POA: Diagnosis not present

## 2018-08-18 DIAGNOSIS — L570 Actinic keratosis: Secondary | ICD-10-CM | POA: Diagnosis not present

## 2018-08-18 DIAGNOSIS — Z08 Encounter for follow-up examination after completed treatment for malignant neoplasm: Secondary | ICD-10-CM | POA: Diagnosis not present

## 2018-08-18 DIAGNOSIS — X32XXXA Exposure to sunlight, initial encounter: Secondary | ICD-10-CM | POA: Diagnosis not present

## 2018-08-18 DIAGNOSIS — Z85828 Personal history of other malignant neoplasm of skin: Secondary | ICD-10-CM | POA: Diagnosis not present

## 2018-09-20 ENCOUNTER — Ambulatory Visit (INDEPENDENT_AMBULATORY_CARE_PROVIDER_SITE_OTHER): Payer: PPO

## 2018-09-20 ENCOUNTER — Encounter: Payer: Self-pay | Admitting: Family Medicine

## 2018-09-20 ENCOUNTER — Ambulatory Visit (INDEPENDENT_AMBULATORY_CARE_PROVIDER_SITE_OTHER): Payer: PPO | Admitting: Family Medicine

## 2018-09-20 VITALS — BP 130/76 | HR 73 | Temp 98.1°F | Resp 15 | Ht 70.0 in | Wt 204.1 lb

## 2018-09-20 DIAGNOSIS — M069 Rheumatoid arthritis, unspecified: Secondary | ICD-10-CM | POA: Diagnosis not present

## 2018-09-20 DIAGNOSIS — C44319 Basal cell carcinoma of skin of other parts of face: Secondary | ICD-10-CM | POA: Diagnosis not present

## 2018-09-20 DIAGNOSIS — C61 Malignant neoplasm of prostate: Secondary | ICD-10-CM | POA: Diagnosis not present

## 2018-09-20 DIAGNOSIS — I10 Essential (primary) hypertension: Secondary | ICD-10-CM

## 2018-09-20 DIAGNOSIS — Z0000 Encounter for general adult medical examination without abnormal findings: Secondary | ICD-10-CM

## 2018-09-20 NOTE — Assessment & Plan Note (Signed)
He will see dermatology as planned.

## 2018-09-20 NOTE — Patient Instructions (Addendum)
  Mr. Julian Pineda , Thank you for taking time to come for your Medicare Wellness Visit. I appreciate your ongoing commitment to your health goals. Please review the following plan we discussed and let me know if I can assist you in the future.   Follow up as needed.    Bring a copy of your [place] and/or Living Will to be scanned into chart.  Happy Holidays!  These are the goals we discussed: Goals    . DIET - INCREASE WATER INTAKE     Place water in line of vision to help remember        This is a list of the screening recommended for you and due dates:  Health Maintenance  Topic Date Due  . Flu Shot  09/28/2018*  . Pneumonia vaccines (1 of 2 - PCV13) 09/21/2019*  . Colon Cancer Screening  11/09/2020  . Tetanus Vaccine  08/11/2025  .  Hepatitis C: One time screening is recommended by Center for Disease Control  (CDC) for  adults born from [birth year] through 1963.   Completed  *Topic was postponed. The date shown is not the original due date.

## 2018-09-20 NOTE — Assessment & Plan Note (Signed)
Patient will continue to see rheumatology.

## 2018-09-20 NOTE — Assessment & Plan Note (Addendum)
Well-controlled.  Continue current regimen.  Discussed staying adequately hydrated particularly given that he is on the losartan.  We did discuss that the losartan should not cause dehydration though it could worsen kidney function if he were to get dehydrated.  Patient will check with his pharmacy regarding his losartan and the prior recall.  He will bring [place] a copy of his recent labs.

## 2018-09-20 NOTE — Patient Instructions (Signed)
Nice to see you. Please try to get [place] a copy of your lab results.

## 2018-09-20 NOTE — Progress Notes (Signed)
Subjective:   Julian Pineda is a 71 y.o. male who presents for Medicare Annual/Subsequent preventive examination.  Review of Systems:  No ROS.  Medicare Wellness Visit. Additional risk factors are reflected in the social history. Cardiac Risk Factors include: advanced age (>17mn, >>19women);male gender;hypertension     Objective:    Vitals: BP 130/76 (BP Location: Left Arm, Patient Position: Sitting, Cuff Size: Normal)   Pulse 73   Temp 98.1 F (36.7 C) (Oral)   Resp 15   Ht '5\' 10"'  (1.778 m)   Wt 204 lb 1.9 oz (92.6 kg)   SpO2 96%   BMI 29.29 kg/m   Body mass index is 29.29 kg/m.  Advanced Directives 09/20/2018 01/28/2018 11/09/2017 09/19/2017 06/17/2017 06/17/2016 12/04/2012  Does Patient Have a Medical Advance Directive? Yes No Yes Yes Yes Yes Patient does not have advance directive  Type of Advance [place]ive [place] - Living will Living will;Healthcare Power of Attorney Living [place] -  Does patient want to make changes to medical advance directive? No - Patient declined - - No - Patient declined - - -  Copy of [place] Chart? No - copy requested - - No - copy requested - No - copy requested -  Would patient like information on creating a medical advance directive? - No - Patient declined - - - - -    Tobacco Social History   Tobacco Use  Smoking Status Never Smoker  Smokeless Tobacco Former USystems developer . Types: Chew     Counseling given: Not Answered   Clinical Intake:  Pre-visit preparation completed: Yes  Pain : No/denies pain     Nutritional Status: BMI 25 -29 Overweight Diabetes: No  How often do you need to have someone help you when you read instructions, pamphlets, or other written materials from your doctor or pharmacy?: 1 - Never  Interpreter Needed?: No     Past Medical History:  Diagnosis Date  . Diverticulosis of colon   . GERD (gastroesophageal reflux  disease)    "occas"  . History of basal cell carcinoma (BCC) excision    09-25-2014  right lower eyelid s/p moh's dx /  left lower eyelid scheduled removal 07-05-2017  . History of squamous cell carcinoma excision    01/ 2015  left lateral cheek  s/p  moh's sx  . Hypertension    sees Dr. JJackalyn [place]ard [place]  . Nocturia   . OA (osteoarthritis)   . Prostate cancer ([facility] urologist-  dr eskidge/  oncologist- dr mTammi Klippel  dx 04/ 2018-- Stage T1c, Gleason 3+3,  PSA 7.1,  vol 38.6cc  . Rheumatoid arteritis ([place]    rheumotologist-   dr bElpidio Galea . Rosacea    Dr. PSharlett Iles . Syncope 01/27/2018  . Vitamin D deficiency    Past Surgical History:  Procedure Laterality Date  . ACHILLES TENDON SURGERY Right 1984  . COLONOSCOPY  2008  . COLONOSCOPY WITH PROPOFOL N/A 11/09/2017   Procedure: COLONOSCOPY WITH PROPOFOL;  Surgeon: EManya Silvas MD;  [facility]  Service: Endoscopy;  Laterality: N/A;  . MOHS SURGERY  09-25-2014;  11-27-2013   [facility]   09-25-2014 right lower eyelid /  11-27-2013  left lateral cheek  . RADIOACTIVE SEED IMPLANT N/A 06/17/2017   Procedure: RADIOACTIVE SEED IMPLANT/BRACHYTHERAPY IMPLANT;  Surgeon: EFestus Aloe MD;  [facility]  Service: Urology;  Laterality: N/A;  . TONSILLECTOMY  child  . TOTAL KNEE ARTHROPLASTY  12/04/2012   Procedure: TOTAL KNEE ARTHROPLASTY;  Surgeon: Vickey Huger, MD;  Location: [place];  Service: Orthopedics;  Laterality: Left;  left total knee arthroplasty  . VARICOSE VEIN SURGERY  2000   left leg   Family History  Problem Relation Age of Onset  . Cancer Mother        Multiple Myeloma  . Alcohol abuse Father   . Arthritis Father   . Prostate cancer Brother   . Breast cancer Daughter   . Hematuria Neg Hx   . Sickle cell anemia Neg Hx   . Kidney cancer Neg Hx   . Bladder Cancer Neg Hx    Social History   Socioeconomic History  . Marital status: Married    Spouse name: Not on file  .  Number of children: Not on file  . Years of education: Not on file  . Highest education level: Not on file  Occupational History  . Not on file  Social Needs  . Financial resource strain: Not hard at all  . Food insecurity:    Worry: Never true    Inability: Never true  . Transportation needs:    Medical: No    Non-medical: No  Tobacco Use  . Smoking status: Never Smoker  . Smokeless tobacco: Former Systems developer    Types: Chew  Substance and Sexual Activity  . Alcohol use: Yes    Alcohol/week: 14.0 standard drinks    Types: 14 Shots of liquor per week    Comment: 2 per day  . Drug use: No  . Sexual activity: Yes  Lifestyle  . Physical activity:    Days per week: 3 days    Minutes per session: 60 min  . Stress: Not at all  Relationships  . Social connections:    Talks on phone: Not on file    Gets together: Not on file    Attends religious service: Not on file    Active member of club or organization: Not on file    Attends meetings of clubs or organizations: Not on file    Relationship status: Married  Other Topics Concern  . Not on file  Social History Narrative   Moderate alcohol use   Lives in [place] with wife, has a daughter and son   Pets has a Engineer, structural   Diet: regular    Exercise. Treadmill 5 days a week   Likes to [employer]    Outpatient Encounter Medications as of 09/20/2018  Medication Sig  . cholecalciferol (VITAMIN D) 1000 units tablet Take 1,000 Units by mouth every morning.   . folic acid (FOLVITE) 1 MG tablet TAKE 1 TABLET EVERY DAY  . losartan (COZAAR) 100 MG tablet TAKE ONE TABLET EVERY DAY  . meloxicam (MOBIC) 15 MG tablet TAKE 1 TABLET BY MOUTH ONCE DAILY WITH FOOD  . methotrexate 50 MG/2ML injection INJECT 1ML WEEKLY  . metroNIDAZOLE (METROCREAM) 0.75 % cream Apply 1 application topically daily.   . tamsulosin (FLOMAX) 0.4 MG CAPS capsule Take 1 capsule (0.4 mg total) by mouth daily after supper.   No facility-administered  encounter medications on file as of 09/20/2018.     Activities of Daily Living In your present state of health, do you have any difficulty performing the following activities: 09/20/2018 01/28/2018  Hearing? N N  Vision? N N  Difficulty concentrating or making decisions? N N  Walking or climbing stairs? N N  Dressing  or bathing? N N  Doing errands, shopping? N N  Preparing Food and eating ? N -  Using the Toilet? N -  In the past six months, have you accidently leaked urine? N -  Do you have problems with loss of bowel control? N -  Managing your Medications? N -  Managing your Finances? N -  Housekeeping or managing your Housekeeping? N -  Some recent data might be hidden    Patient Care Team: Leone Haven, MD as PCP - General (Family Medicine)   Assessment:   This is a routine wellness examination for Julian Pineda.  The goal of the wellness visit is to assist the patient how to close the gaps in care and create a preventative care plan for the patient.   The roster of all physicians providing medical care to patient is listed in the Snapshot section of the chart.  Taking calcium VIT D as appropriate/Osteoporosis risk reviewed.    Safety issues reviewed; Smoke and carbon monoxide detectors in the home. No firearms in the home. Wears seatbelts when driving or riding with others. No violence in the home.  They do not have excessive sun exposure.  Discussed the need for sun protection: hats, long sleeves and the use of sunscreen if there is significant sun exposure.  Patient is alert, normal appearance, oriented to person/place/and time. Correctly identified the president of the [place] and recalls of 3/3 words.Performs simple calculations and can read correct time from watch face.  Displays appropriate judgement.  No new identified risk were noted.  No failures at ADL's or IADL's.    BMI- discussed the importance of a healthy diet, water intake and the benefits of aerobic  exercise. Educational material provided.   24 hour diet recall: Regular diet  Dental- every 6 months.  Pneumococcal discussed.  Follow up with pcp.  Exercise Activities and Dietary recommendations Current Exercise Habits: Home exercise routine, Type of exercise: strength training/weights;calisthenics, Time (Minutes): 60, Frequency (Times/Week): 3, Weekly Exercise (Minutes/Week): 180, Intensity: Moderate  Goals    . DIET - INCREASE WATER INTAKE     Place water in line of vision to help remember        Fall Risk Fall Risk  09/20/2018 09/19/2017 03/23/2017 02/08/2017 10/17/2015  Falls in the past year? 0 No No No No   Depression Screen PHQ 2/9 Scores 09/20/2018 09/19/2017 03/23/2017 02/08/2017  PHQ - 2 Score 0 0 0 0  PHQ- 9 Score - 0 - -    Cognitive Function MMSE - Mini Mental State Exam 09/19/2017  Orientation to time 5  Orientation to Place 5  Registration 3  Attention/ Calculation 5  Recall 3  Language- name 2 objects 2  Language- repeat 1  Language- follow 3 step command 3  Language- read & follow direction 1  Write a sentence 1  Copy design 1  Total score 30     6CIT Screen 09/20/2018  What Year? 0 points  What month? 0 points  What time? 0 points  Count back from 20 0 points  Months in reverse 0 points  Repeat phrase 0 points  Total Score 0    Immunization History  Administered Date(s) Administered  . DTaP 02/19/2010  . Tdap 08/12/2015   Screening Tests Health Maintenance  Topic Date Due  . INFLUENZA VACCINE  09/28/2018 (Originally 06/01/2018)  . PNA vac Low Risk Adult (1 of 2 - PCV13) 09/21/2019 (Originally 01/15/2013)  . COLONOSCOPY  11/09/2020  . TETANUS/TDAP  08/11/2025  .  Hepatitis C Screening  Completed      Plan:   End of life planning; Advance aging; Advanced directives discussed. Copy of current HCPOA/Living Will requested.    I have personally reviewed and noted the following in the patient's chart:   . Medical and social  history . Use of alcohol, tobacco or illicit drugs  . Current medications and supplements . Functional ability and status . Nutritional status . Physical activity . Advanced directives . List of other physicians . Hospitalizations, surgeries, and ER visits in previous 12 months . Vitals . Screenings to include cognitive, depression, and falls . Referrals and appointments  In addition, I have reviewed and discussed with patient certain preventive protocols, quality metrics, and best practice recommendations. A written personalized care plan for preventive services as well as general preventive health recommendations were provided to patient.     Varney Biles, LPN  [id number]

## 2018-09-20 NOTE — Progress Notes (Signed)
  Tommi Rumps, MD Phone: [phone]  Julian Pineda is a 72 y.o. male who presents today for f/u.  CC: htn, RA, history prostate cancer  HYPERTENSION  Disease Monitoring  Home BP Monitoring not checking Chest pain- no    Dyspnea- no Medications  Compliance-  Taking losartan.  Edema- no Patient wonders whether or not losartan can cause dehydration.  Rheumatoid arthritis: Continues to follow with rheumatology.  He reports having labs about 2 months ago.  He continues on meloxicam.  This does help with his joint pain.  He notes it mostly bothers him in his wrists.  He continues on methotrexate as well.  History of prostate cancer: He followed with urology several months ago.  He reports his PSA was around 1.6.  He did brachytherapy.  He notes a small amount of burning intermittently with urination which has been present since he had the seeds placed.  He notes that is why he is on Flomax.  No straining.  He does feel empty.  He does urinate frequently at times.  Basal cell carcinoma: Patient recently had a basal cell biopsied on the back of his left leg.  He goes back in December to have the rest of the area removed.   Social History   Tobacco Use  Smoking Status Never Smoker  Smokeless Tobacco Former Systems developer  . Types: Chew     ROS see history of present illness  Objective  Physical Exam Vitals:   09/20/18 0906  BP: 130/76  Pulse: 73  Temp: 98.1 F (36.7 C)  SpO2: 96%    BP Readings from Last 3 Encounters:  09/20/18 130/76  09/20/18 130/76  05/15/18 122/70   Wt Readings from Last 3 Encounters:  09/20/18 204 lb 1.9 oz (92.6 kg)  09/20/18 204 lb 1.9 oz (92.6 kg)  05/15/18 205 lb 12.8 oz (93.4 kg)    Physical Exam  Constitutional: No distress.  Cardiovascular: Normal rate, regular rhythm and normal heart sounds.  Pulmonary/Chest: Effort normal and breath sounds normal.  Musculoskeletal: He exhibits no edema.  Neurological: He is alert.  Skin: Skin is warm  and dry. He is not diaphoretic.        Assessment/Plan: Please see individual problem list.  Hypertension Well-controlled.  Continue current regimen.  Discussed staying adequately hydrated particularly given that he is on the losartan.  We did discuss that the losartan should not cause dehydration though it could worsen kidney function if he were to get dehydrated.  Patient will check with his pharmacy regarding his losartan and the prior recall.  He will bring [place] a copy of his recent labs.  Rheumatoid arthritis [facility]) Patient will continue to see rheumatology.  Malignant neoplasm of prostate [facility]) Patient will continue to see urology.  Basal cell carcinoma He will see dermatology as planned.   No orders of the defined types were placed in this encounter.   No orders of the defined types were placed in this encounter.    Tommi Rumps, MD [place]

## 2018-09-20 NOTE — Assessment & Plan Note (Signed)
Patient will continue to see urology.

## 2018-09-21 NOTE — Progress Notes (Signed)
I have reviewed the above note and agree.  Tatsuya Okray, M.D.  

## 2018-09-26 DIAGNOSIS — Z6829 Body mass index (BMI) 29.0-29.9, adult: Secondary | ICD-10-CM | POA: Diagnosis not present

## 2018-09-26 DIAGNOSIS — M19049 Primary osteoarthritis, unspecified hand: Secondary | ICD-10-CM | POA: Diagnosis not present

## 2018-09-26 DIAGNOSIS — E663 Overweight: Secondary | ICD-10-CM | POA: Diagnosis not present

## 2018-09-26 DIAGNOSIS — M0589 Other rheumatoid arthritis with rheumatoid factor of multiple sites: Secondary | ICD-10-CM | POA: Diagnosis not present

## 2018-09-26 DIAGNOSIS — Z79899 Other long term (current) drug therapy: Secondary | ICD-10-CM | POA: Diagnosis not present

## 2018-09-26 DIAGNOSIS — R5382 Chronic fatigue, unspecified: Secondary | ICD-10-CM | POA: Diagnosis not present

## 2018-10-03 DIAGNOSIS — C44719 Basal cell carcinoma of skin of left lower limb, including hip: Secondary | ICD-10-CM | POA: Diagnosis not present

## 2018-11-19 IMAGING — MR MR PROSTATE WO/W CM
23 of 56 series · 23 of 56 positions shown · IV contrast (yes)
Comparison: None.

CLINICAL DATA: Elevated PSA levels.

EXAM:
MR PROSTATE WITHOUT AND WITH CONTRAST
TECHNIQUE: Multiplanar multisequence MRI images were obtained of the pelvis
centered about the prostate. Pre and post contrast images were
obtained.
CONTRAST:  19mL MULTIHANCE GADOBENATE DIMEGLUMINE 529 MG/ML IV SOLN

[Series 3: bSSFP fat-sat · axial · 6.0mm · 0.86mm/px · 1 of 44 slices shown]
[im 1/44]
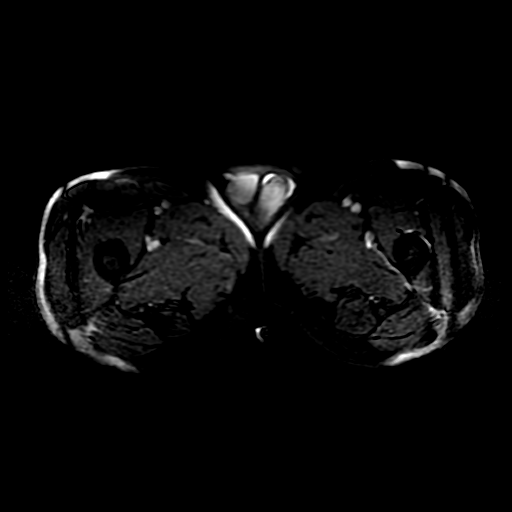

[Series 4: T1 · axial · 6.0mm · 0.86mm/px · 1 of 44 slices shown (1 of 3)]
[im 1/44]
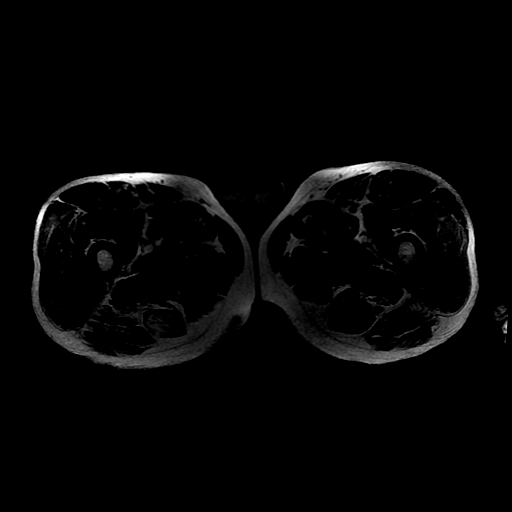

[Series 5: T2 · axial · 3.0mm · 0.29mm/px · 1 of 24 slices shown (1 of 4)]
[im 1/24]
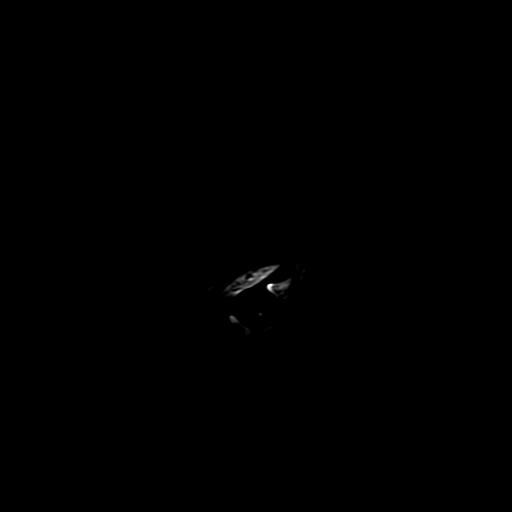

[Series 6: T1 · axial · 3.0mm · 0.29mm/px · 1 of 24 slices shown (2 of 3)]
[im 1/24]
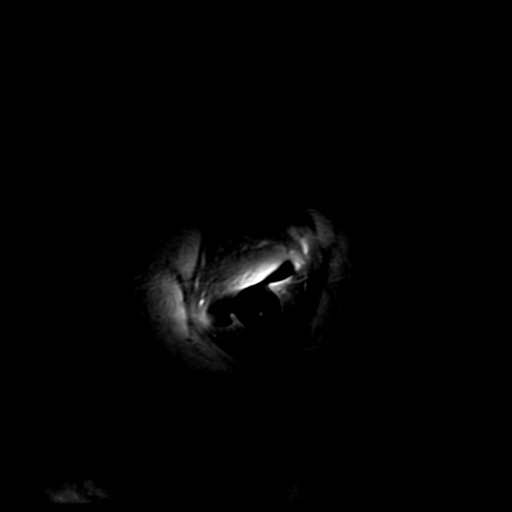

[Series 7: T2 · axial · 1.8mm · 0.47mm/px · 1 of 156 slices shown (2 of 4)]
[im 1/156]
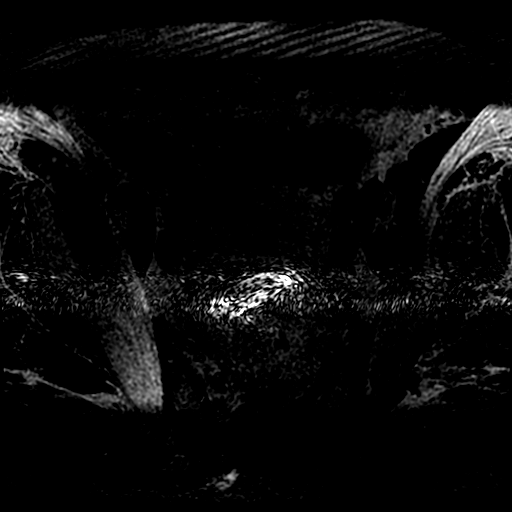

[Series 8: T2 · sagittal · 4.0mm · 0.29mm/px · 1 of 24 slices shown (3 of 4)]
[im 1/24]
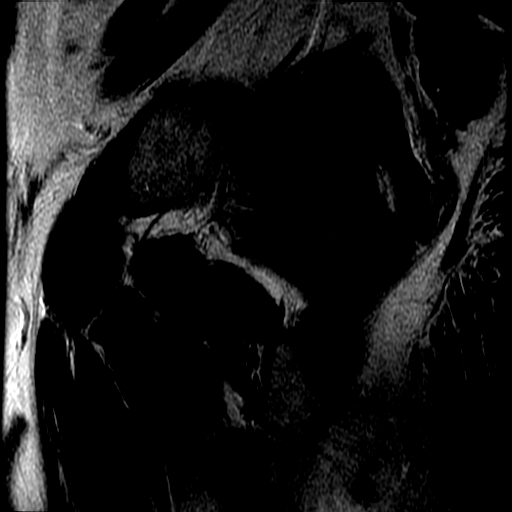

[Series 9: T2 · coronal · 4.0mm · 0.29mm/px · 1 of 22 slices shown (4 of 4)]
[im 1/22]
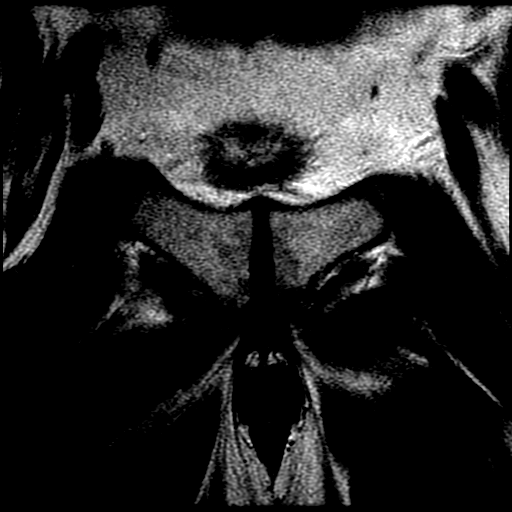

[Series 10: DWI · axial · 3.0mm · 0.59mm/px · 1 of 47 slices shown (1 of 6)]
[im 1/47]
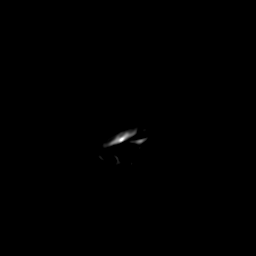

[Series 11: DWI · axial · 3.0mm · 0.59mm/px · 1 of 47 slices shown (2 of 6)]
[im 1/47]
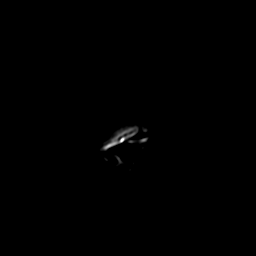

[Series 12: DWI · axial · 3.0mm · 0.59mm/px · 1 of 46 slices shown (3 of 6)]
[im 1/46]
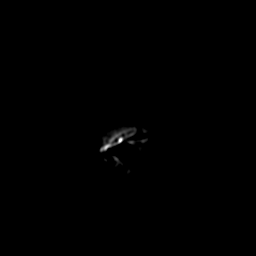

[Series 13: T1 · axial · 3.0mm · 0.29mm/px · 1 of 24 slices shown (3 of 3)]
[im 1/24]
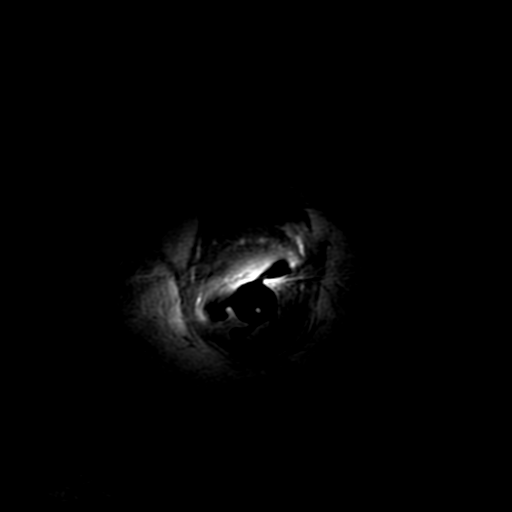

[Series 700: reformatted · axial · 1.8mm · 0.47mm/px · 1 of 52 slices shown (1 of 2)]
[im 1/52]
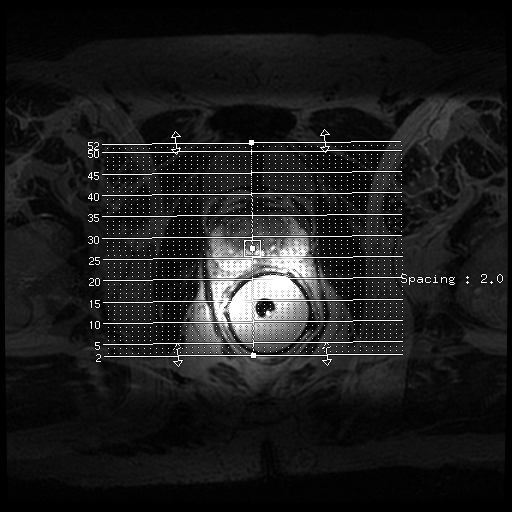

[Series 701: reformatted · axial · 1.8mm · 0.47mm/px · 1 of 115 slices shown (2 of 2)]
[im 1/115]
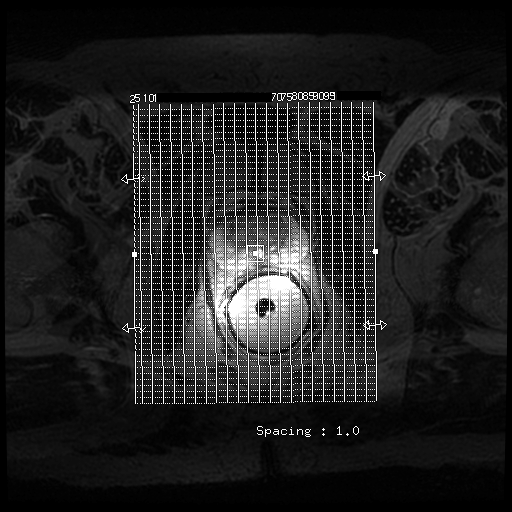

[Series 1000: DWI · axial · 3.0mm · 0.59mm/px · 1 of 24 slices shown (4 of 6)]
[im 1/24]
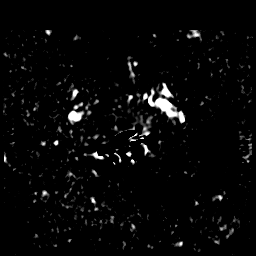

[Series 1100: DWI · axial · 3.0mm · 0.59mm/px · 1 of 24 slices shown (5 of 6)]
[im 1/24]
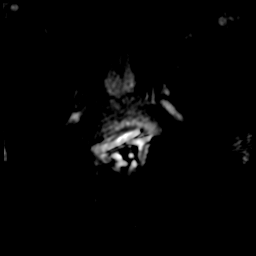

[Series 1200: DWI · axial · 3.0mm · 0.59mm/px · 1 of 24 slices shown (6 of 6)]
[im 1/24]
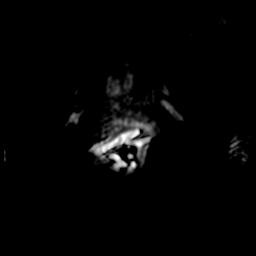

[((id)/(id)/1)-((id)/(id)/1) · axial · 3.0mm · 0.43mm/px · 1 of 68 slices shown (1 of 7)]
[im 1/68]
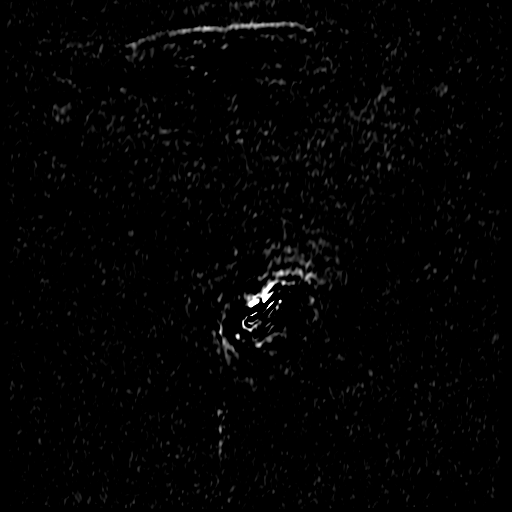

[((id)/(id)/1)-((id)/(id)/1) · axial · 3.0mm · 0.43mm/px · 1 of 68 slices shown (2 of 7)]
[im 1/68]
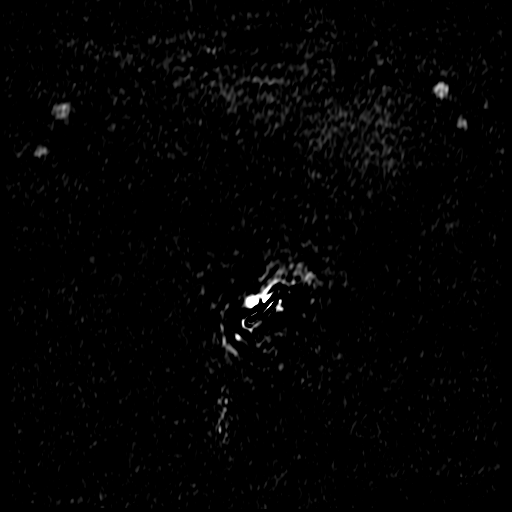

[((id)/(id)/1)-((id)/(id)/1) · axial · 3.0mm · 0.43mm/px · 1 of 67 slices shown (3 of 7)]
[im 1/67]
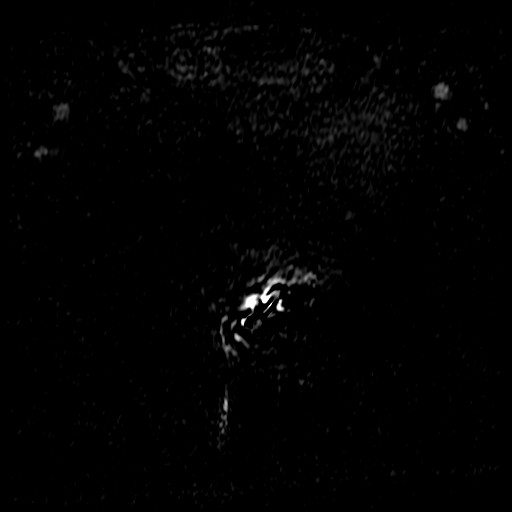

[((id)/(id)/1)-((id)/(id)/1) · axial · 3.0mm · 0.43mm/px · 1 of 68 slices shown (4 of 7)]
[im 1/68]
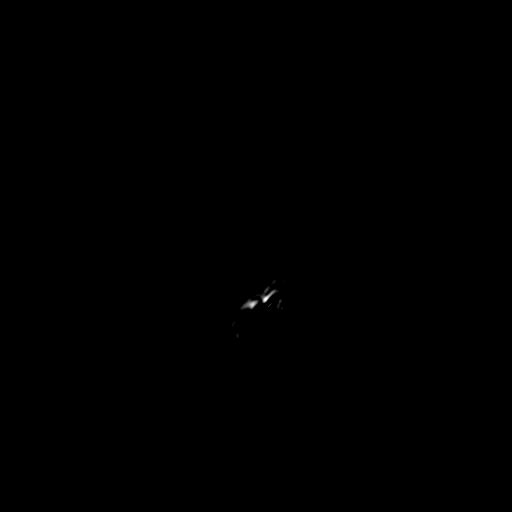

[((id)/(id)/1)-((id)/(id)/1) · axial · 3.0mm · 0.43mm/px · 1 of 67 slices shown (5 of 7)]
[im 1/67]
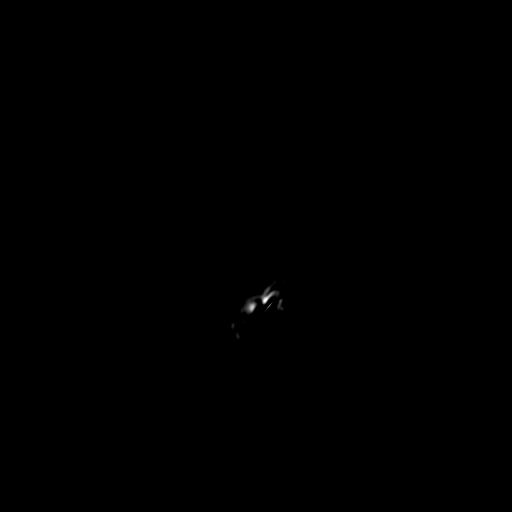

[((id)/(id)/1)-((id)/(id)/1) · axial · 3.0mm · 0.43mm/px · 1 of 68 slices shown (6 of 7)]
[im 1/68]
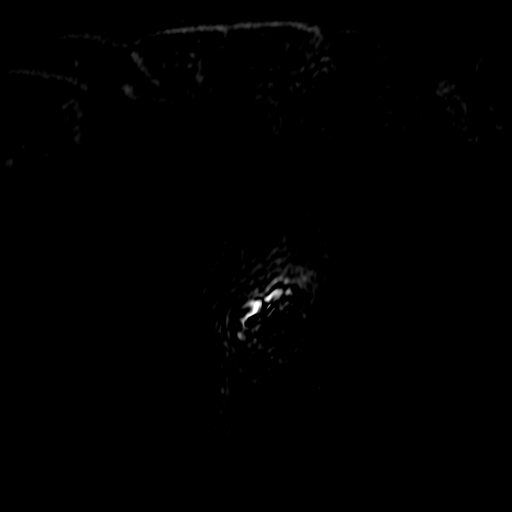

[((id)/(id)/1)-((id)/(id)/1) · axial · 3.0mm · 0.43mm/px · 1 of 68 slices shown (7 of 7)]
[im 1/68]
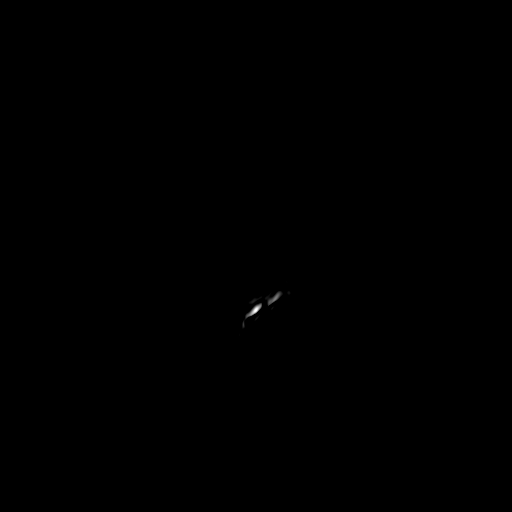

[23 of 56 positions shown; findings below may reference images not displayed]

FINDINGS: Prostate: Prostate volume 38.6 cc.

In the right lateral and anterior peripheral zone at the apex, there
is a 1.48 CC lesion measuring 2.5 by 1.2 cm in plane, with low T2
and low ADC map activity, early enhancement and drop off of
enhancement. PI-RADS category 5, very high suspicion for prostate
cancer. Some images suggest the possibility of faint localize trans
capsular spread.

Volume: 38.6 cc

Transcapsular spread: Absent, although part of the capsular border
along the right apical lesion is indistinct.

Seminal vesicle involvement: Absent

Neurovascular bundle involvement: Absent

Pelvic adenopathy: Absent

Bone metastasis: Absent

Other findings: Sigmoid colon diverticulosis.
IMPRESSION: 1. Right lateral and anterior peripheral zone apical lesion,
cubic cm, PI-RADS category 5, high suspicion for prostate cancer. No
definite trans capsular spread. No adenopathy or bony lesions
observed. Prostate gland volume 38.6 cc.
2. Sigmoid colon diverticulosis

## 2018-11-28 DIAGNOSIS — C61 Malignant neoplasm of prostate: Secondary | ICD-10-CM | POA: Diagnosis not present

## 2018-12-04 DIAGNOSIS — Z8546 Personal history of malignant neoplasm of prostate: Secondary | ICD-10-CM | POA: Diagnosis not present

## 2018-12-04 DIAGNOSIS — R3912 Poor urinary stream: Secondary | ICD-10-CM | POA: Diagnosis not present

## 2018-12-21 ENCOUNTER — Other Ambulatory Visit: Payer: Self-pay | Admitting: Family Medicine

## 2019-03-21 ENCOUNTER — Ambulatory Visit (INDEPENDENT_AMBULATORY_CARE_PROVIDER_SITE_OTHER): Payer: PPO | Admitting: Family Medicine

## 2019-03-21 ENCOUNTER — Encounter: Payer: Self-pay | Admitting: Family Medicine

## 2019-03-21 ENCOUNTER — Telehealth: Payer: Self-pay | Admitting: Family Medicine

## 2019-03-21 ENCOUNTER — Other Ambulatory Visit: Payer: Self-pay

## 2019-03-21 DIAGNOSIS — C61 Malignant neoplasm of prostate: Secondary | ICD-10-CM

## 2019-03-21 DIAGNOSIS — R110 Nausea: Secondary | ICD-10-CM

## 2019-03-21 DIAGNOSIS — I10 Essential (primary) hypertension: Secondary | ICD-10-CM

## 2019-03-21 DIAGNOSIS — C44319 Basal cell carcinoma of skin of other parts of face: Secondary | ICD-10-CM

## 2019-03-21 NOTE — Assessment & Plan Note (Signed)
He will continue to see urology.  He is interested in having a PSA rechecked.  He will check with his rheumatologist to see if they are okay ordering that and if not I can order this.

## 2019-03-21 NOTE — Progress Notes (Signed)
Virtual Visit via video Note  This visit type was conducted due to national recommendations for restrictions regarding the COVID-19 pandemic (e.g. social distancing).  This format is felt to be most appropriate for this patient at this time.  All issues noted in this document were discussed and addressed.  No physical exam was performed (except for noted visual exam findings with Video Visits).   I connected with Julian Pineda today at  9:30 AM EDT by a video enabled telemedicine application and verified that I am speaking with the correct person using two identifiers. Location patient: home Location provider: work Persons participating in the virtual visit: patient, provider  I discussed the limitations, risks, security and privacy concerns of performing an evaluation and management service by telephone and the availability of in person appointments. I also discussed with the patient that there may be a patient responsible charge related to this service. The patient expressed understanding and agreed to proceed.  Reason for visit: follow-up  HPI: Hypertension: Not checking blood pressures.  Taking losartan.  No chest pain, shortness of breath, or edema.  Prostate cancer history: Notes this is going well.  He reports his PSA dropped when he followed with urology recently.  He notes his urine flow is better with Flomax than it had been.  He notes the burning has improved as well.  History of basal cell carcinoma: He notes he had one in December.  He notes no new lesions though he does want to follow-up with dermatology.  They canceled his recent appointment given the COVID-19 pandemic.  He is going to call them to reschedule.  Nausea: Patient notes he has had issues with nausea for months and months and months and months.  He notes he was taking his medication in the morning and was getting nausea after taking his medications and switched to night and notes he does still get some nausea though  not as much.  He was getting nausea with regular milk and notes that he started Lactaid milk and that has improved.  He notes no vomiting or diarrhea.  No abdominal pain.  He notes it mostly last for 30 to 40 minutes and typically occurs after taking his medications.   ROS: See pertinent positives and negatives per HPI.  Past Medical History:  Diagnosis Date   Diverticulosis of colon    GERD (gastroesophageal reflux disease)    "occas"   History of basal cell carcinoma (BCC) excision    09-25-2014  right lower eyelid s/p moh's dx /  left lower eyelid scheduled removal 07-05-2017   History of squamous cell carcinoma excision    01/ 2015  left lateral cheek  s/p  moh's sx   Hypertension    sees Dr. Jackalyn Lombard, [place]   Nocturia    OA (osteoarthritis)    Prostate cancer [facility]) urologist-  dr eskidge/  oncologist- dr [name]   dx 04/ 2018-- Stage T1c, Gleason 3+3,  PSA 7.1,  vol 38.6cc   Rheumatoid arteritis ([place])    rheumotologist-   dr Elpidio Galea   Rosacea    Dr. Sharlett Iles   Syncope 01/27/2018   Vitamin D deficiency     Past Surgical History:  Procedure Laterality Date   ACHILLES TENDON SURGERY Right 1984   COLONOSCOPY  2008   COLONOSCOPY WITH PROPOFOL N/A 11/09/2017   Procedure: COLONOSCOPY WITH PROPOFOL;  Surgeon: Manya Silvas, MD;  Location: [facility] ENDOSCOPY;  Service: Endoscopy;  Laterality: N/A;   MOHS SURGERY  09-25-2014;  11-27-2013   [place]   09-25-2014 right lower eyelid /  11-27-2013  left lateral cheek   RADIOACTIVE SEED IMPLANT N/A 06/17/2017   Procedure: RADIOACTIVE SEED IMPLANT/BRACHYTHERAPY IMPLANT;  Surgeon: Festus Aloe, MD;  Location: [facility];  Service: Urology;  Laterality: N/A;   TONSILLECTOMY  child   TOTAL KNEE ARTHROPLASTY  12/04/2012   Procedure: TOTAL KNEE ARTHROPLASTY;  Surgeon: Vickey Huger, MD;  Location: [place];  Service: Orthopedics;  Laterality: Left;  left total knee arthroplasty   VARICOSE VEIN SURGERY   2000   left leg    Family History  Problem Relation Age of Onset   Cancer Mother        Multiple Myeloma   Alcohol abuse Father    Arthritis Father    Prostate cancer Brother    Breast cancer Daughter    Hematuria Neg Hx    Sickle cell anemia Neg Hx    Kidney cancer Neg Hx    Bladder Cancer Neg Hx     SOCIAL HX: Non-smoker, formerly used chewing tobacco   Current Outpatient Medications:    cholecalciferol (VITAMIN D) 1000 units tablet, Take 1,000 Units by mouth every morning. , Disp: , Rfl:    folic acid (FOLVITE) 1 MG tablet, TAKE 1 TABLET EVERY DAY, Disp: 90 tablet, Rfl: 0   losartan (COZAAR) 100 MG tablet, TAKE ONE TABLET BY MOUTH EVERY DAY, Disp: 90 tablet, Rfl: 1   meloxicam (MOBIC) 15 MG tablet, TAKE 1 TABLET BY MOUTH ONCE DAILY WITH FOOD, Disp: 30 tablet, Rfl: 2   methotrexate 50 MG/2ML injection, INJECT 1ML WEEKLY, Disp: 4 mL, Rfl: 6   metroNIDAZOLE (METROCREAM) 0.75 % cream, Apply 1 application topically daily. , Disp: , Rfl:    tamsulosin (FLOMAX) 0.4 MG CAPS capsule, Take 1 capsule (0.4 mg total) by mouth daily after supper., Disp: 30 capsule, Rfl: 0  EXAM:  VITALS per patient if applicable: None.  GENERAL: alert, oriented, appears well and in no acute distress  HEENT: atraumatic, conjunttiva clear, no obvious abnormalities on inspection of external nose and ears  NECK: normal movements of the head and neck  LUNGS: on inspection no signs of respiratory distress, breathing rate appears normal, no obvious gross SOB, gasping or wheezing  CV: no obvious cyanosis  MS: moves all visible extremities without noticeable abnormality  PSYCH/NEURO: pleasant and cooperative, no obvious depression or anxiety, speech and thought processing grossly intact  ASSESSMENT AND PLAN:  Discussed the following assessment and plan:  Essential hypertension  Basal cell carcinoma (BCC) of skin of other part of face  Malignant neoplasm of prostate  (HCC)  Nausea  Hypertension Patient will check his blood pressure several days a week for the next 2 weeks and let us know what his readings are.  He will continue his current medication.  He is due for lab work and notes he will be having labs done through his rheumatologist in the next several weeks.  He will ask if they can complete a lipid panel.  He does report they typically check electrolytes, renal function, and liver function testing.  Basal cell carcinoma He will contact his dermatologist to see if he can schedule a follow-up.  Malignant neoplasm of prostate [facility]) He will continue to see urology.  He is interested in having a PSA rechecked.  He will check with his rheumatologist to see if they are okay ordering that and if not I can order this.  Nausea Seems to be related to when he  takes his medication.  I discussed having him take some of his medications in the morning and some at night to see if it is related to him taking them all at once.  He is taking his meloxicam with food and I discussed that he should continue that.  He will pay attention to his nausea with this splitting of his medications and if he can identify the medication that is causing this he will let us know.  If this issue persists he will let us know.   CMA will contact the patient to get him scheduled for follow-up in 6 months.  Social distancing precautions and sick precautions given regarding COVID-19.   I discussed the assessment and treatment plan with the patient. The patient was provided an opportunity to ask questions and all were answered. The patient agreed with the plan and demonstrated an understanding of the instructions.   The patient was advised to call back or seek an in-person evaluation if the symptoms worsen or if the condition fails to improve as anticipated.   Tommi Rumps, MD

## 2019-03-21 NOTE — Telephone Encounter (Signed)
Appt scheduled

## 2019-03-21 NOTE — Assessment & Plan Note (Signed)
Seems to be related to when he takes his medication.  I discussed having him take some of his medications in the morning and some at night to see if it is related to him taking them all at once.  He is taking his meloxicam with food and I discussed that he should continue that.  He will pay attention to his nausea with this splitting of his medications and if he can identify the medication that is causing this he will let us know.  If this issue persists he will let us know.

## 2019-03-21 NOTE — Assessment & Plan Note (Signed)
He will contact his dermatologist to see if he can schedule a follow-up.

## 2019-03-21 NOTE — Telephone Encounter (Signed)
Please contact the patient and get him set up for 42-month follow-up.  Thanks.

## 2019-03-21 NOTE — Assessment & Plan Note (Signed)
Patient will check his blood pressure several days a week for the next 2 weeks and let us know what his readings are.  He will continue his current medication.  He is due for lab work and notes he will be having labs done through his rheumatologist in the next several weeks.  He will ask if they can complete a lipid panel.  He does report they typically check electrolytes, renal function, and liver function testing.

## 2019-03-27 ENCOUNTER — Telehealth: Payer: Self-pay

## 2019-03-27 DIAGNOSIS — R5382 Chronic fatigue, unspecified: Secondary | ICD-10-CM | POA: Diagnosis not present

## 2019-03-27 DIAGNOSIS — Z79899 Other long term (current) drug therapy: Secondary | ICD-10-CM | POA: Diagnosis not present

## 2019-03-27 DIAGNOSIS — E782 Mixed hyperlipidemia: Secondary | ICD-10-CM | POA: Diagnosis not present

## 2019-03-27 DIAGNOSIS — M0589 Other rheumatoid arthritis with rheumatoid factor of multiple sites: Secondary | ICD-10-CM | POA: Diagnosis not present

## 2019-03-27 DIAGNOSIS — M19049 Primary osteoarthritis, unspecified hand: Secondary | ICD-10-CM | POA: Diagnosis not present

## 2019-03-27 DIAGNOSIS — Z8546 Personal history of malignant neoplasm of prostate: Secondary | ICD-10-CM | POA: Diagnosis not present

## 2019-03-28 DIAGNOSIS — Z8546 Personal history of malignant neoplasm of prostate: Secondary | ICD-10-CM | POA: Diagnosis not present

## 2019-03-28 DIAGNOSIS — M0589 Other rheumatoid arthritis with rheumatoid factor of multiple sites: Secondary | ICD-10-CM | POA: Diagnosis not present

## 2019-03-28 DIAGNOSIS — E782 Mixed hyperlipidemia: Secondary | ICD-10-CM | POA: Diagnosis not present

## 2019-03-30 ENCOUNTER — Other Ambulatory Visit: Payer: PPO

## 2019-05-29 DIAGNOSIS — L570 Actinic keratosis: Secondary | ICD-10-CM | POA: Diagnosis not present

## 2019-05-29 DIAGNOSIS — D2262 Melanocytic nevi of left upper limb, including shoulder: Secondary | ICD-10-CM | POA: Diagnosis not present

## 2019-05-29 DIAGNOSIS — C44319 Basal cell carcinoma of skin of other parts of face: Secondary | ICD-10-CM | POA: Diagnosis not present

## 2019-05-29 DIAGNOSIS — X32XXXA Exposure to sunlight, initial encounter: Secondary | ICD-10-CM | POA: Diagnosis not present

## 2019-05-29 DIAGNOSIS — D485 Neoplasm of uncertain behavior of skin: Secondary | ICD-10-CM | POA: Diagnosis not present

## 2019-05-29 DIAGNOSIS — Z85828 Personal history of other malignant neoplasm of skin: Secondary | ICD-10-CM | POA: Diagnosis not present

## 2019-05-29 DIAGNOSIS — Z08 Encounter for follow-up examination after completed treatment for malignant neoplasm: Secondary | ICD-10-CM | POA: Diagnosis not present

## 2019-05-29 DIAGNOSIS — D2271 Melanocytic nevi of right lower limb, including hip: Secondary | ICD-10-CM | POA: Diagnosis not present

## 2019-05-29 DIAGNOSIS — D225 Melanocytic nevi of trunk: Secondary | ICD-10-CM | POA: Diagnosis not present

## 2019-05-29 DIAGNOSIS — D2261 Melanocytic nevi of right upper limb, including shoulder: Secondary | ICD-10-CM | POA: Diagnosis not present

## 2019-05-29 DIAGNOSIS — D0439 Carcinoma in situ of skin of other parts of face: Secondary | ICD-10-CM | POA: Diagnosis not present

## 2019-05-29 DIAGNOSIS — D2272 Melanocytic nevi of left lower limb, including hip: Secondary | ICD-10-CM | POA: Diagnosis not present

## 2019-05-30 DIAGNOSIS — Z8546 Personal history of malignant neoplasm of prostate: Secondary | ICD-10-CM | POA: Diagnosis not present

## 2019-06-12 DIAGNOSIS — N5201 Erectile dysfunction due to arterial insufficiency: Secondary | ICD-10-CM | POA: Diagnosis not present

## 2019-06-12 DIAGNOSIS — R3912 Poor urinary stream: Secondary | ICD-10-CM | POA: Diagnosis not present

## 2019-06-12 DIAGNOSIS — Z8546 Personal history of malignant neoplasm of prostate: Secondary | ICD-10-CM | POA: Diagnosis not present

## 2019-06-13 DIAGNOSIS — Z85828 Personal history of other malignant neoplasm of skin: Secondary | ICD-10-CM | POA: Diagnosis not present

## 2019-06-13 DIAGNOSIS — C44329 Squamous cell carcinoma of skin of other parts of face: Secondary | ICD-10-CM | POA: Diagnosis not present

## 2019-06-20 ENCOUNTER — Other Ambulatory Visit: Payer: Self-pay | Admitting: Family Medicine

## 2019-06-27 DIAGNOSIS — M0589 Other rheumatoid arthritis with rheumatoid factor of multiple sites: Secondary | ICD-10-CM | POA: Diagnosis not present

## 2019-07-05 DIAGNOSIS — C44712 Basal cell carcinoma of skin of right lower limb, including hip: Secondary | ICD-10-CM | POA: Diagnosis not present

## 2019-07-13 NOTE — Telephone Encounter (Signed)
NA

## 2019-07-19 DIAGNOSIS — M545 Low back pain: Secondary | ICD-10-CM | POA: Diagnosis not present

## 2019-07-19 DIAGNOSIS — M5416 Radiculopathy, lumbar region: Secondary | ICD-10-CM | POA: Diagnosis not present

## 2019-07-30 DIAGNOSIS — M4316 Spondylolisthesis, lumbar region: Secondary | ICD-10-CM | POA: Diagnosis not present

## 2019-07-30 DIAGNOSIS — M5126 Other intervertebral disc displacement, lumbar region: Secondary | ICD-10-CM | POA: Diagnosis not present

## 2019-07-30 DIAGNOSIS — M5410 Radiculopathy, site unspecified: Secondary | ICD-10-CM | POA: Diagnosis not present

## 2019-07-30 DIAGNOSIS — M419 Scoliosis, unspecified: Secondary | ICD-10-CM | POA: Diagnosis not present

## 2019-08-02 DIAGNOSIS — M5416 Radiculopathy, lumbar region: Secondary | ICD-10-CM | POA: Diagnosis not present

## 2019-08-13 DIAGNOSIS — M5416 Radiculopathy, lumbar region: Secondary | ICD-10-CM | POA: Diagnosis not present

## 2019-08-15 DIAGNOSIS — M5416 Radiculopathy, lumbar region: Secondary | ICD-10-CM | POA: Diagnosis not present

## 2019-09-07 ENCOUNTER — Other Ambulatory Visit: Payer: Self-pay

## 2019-09-10 ENCOUNTER — Encounter: Payer: Self-pay | Admitting: Family Medicine

## 2019-09-10 ENCOUNTER — Other Ambulatory Visit: Payer: Self-pay

## 2019-09-10 ENCOUNTER — Ambulatory Visit (INDEPENDENT_AMBULATORY_CARE_PROVIDER_SITE_OTHER): Payer: PPO | Admitting: Family Medicine

## 2019-09-10 VITALS — BP 140/80 | HR 75 | Temp 96.9°F | Ht 70.0 in | Wt 202.0 lb

## 2019-09-10 DIAGNOSIS — Z8546 Personal history of malignant neoplasm of prostate: Secondary | ICD-10-CM | POA: Diagnosis not present

## 2019-09-10 DIAGNOSIS — M069 Rheumatoid arthritis, unspecified: Secondary | ICD-10-CM

## 2019-09-10 DIAGNOSIS — M545 Low back pain: Secondary | ICD-10-CM

## 2019-09-10 DIAGNOSIS — G8929 Other chronic pain: Secondary | ICD-10-CM | POA: Diagnosis not present

## 2019-09-10 DIAGNOSIS — R5382 Chronic fatigue, unspecified: Secondary | ICD-10-CM | POA: Insufficient documentation

## 2019-09-10 DIAGNOSIS — I10 Essential (primary) hypertension: Secondary | ICD-10-CM | POA: Diagnosis not present

## 2019-09-10 DIAGNOSIS — R5383 Other fatigue: Secondary | ICD-10-CM | POA: Diagnosis not present

## 2019-09-10 NOTE — Assessment & Plan Note (Signed)
Borderline.  He will check at home and return in 2 weeks for a BP check with nursing.  Continue losartan.

## 2019-09-10 NOTE — Assessment & Plan Note (Signed)
He will continue to see urology. 

## 2019-09-10 NOTE — Progress Notes (Signed)
Tommi Rumps, MD Phone: [phone]  Julian Pineda is a 73 y.o. male who presents today for follow-up.  Low back pain: Patient notes he had left low back pain starting in September.  Notes his low back went out where he had difficulty standing.  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  He had a course of prednisone and underwent a steroid injection as well.  He notes he is back to his baseline.  He is doing core exercises and stretching.  No incontinence.  He had numbness in his left leg when this occurred though that has improved and only has some occasional tingling.  No weakness.  He follows up with Ortho tomorrow.  Hypertension: Not checking at home.  Taking losartan.  No chest pain, shortness of breath, or edema.  Decreased energy level: Patient notes he has felt as though his energy level is slightly lower than typical.  He feels like he is lazy at times.  No significant depression.  May be has some mild anxiety.  He wonders about having his testosterone checked.  Rheumatoid arthritis: He follows with rheumatology.  He is on methotrexate.  He wonders about having testing done to confirm whether or not he has rheumatoid arthritis.  He had testing previously notes he was advised by rheumatology he had rheumatoid arthritis.  Prostate cancer history: Patient reports he follows with urology once yearly.  He reports his last PSA was 0.6.  Social History   Tobacco Use  Smoking Status Never Smoker  Smokeless Tobacco Former Systems developer  . Types: Chew     ROS see history of present illness  Objective  Physical Exam Vitals:   09/10/19 1452  BP: 140/80  Pulse: 75  Temp: (!) 96.9 F (36.1 C)  SpO2: 97%    BP Readings from Last 3 Encounters:  09/10/19 140/80  09/20/18 130/76  09/20/18 130/76   Wt Readings from Last 3 Encounters:  09/10/19 202 lb (91.6 kg)  09/20/18 204 lb 1.9 oz (92.6 kg)  09/20/18 204 lb 1.9 oz (92.6  kg)    Physical Exam Constitutional:      General: He is not in acute distress.    Appearance: He is not diaphoretic.  Cardiovascular:     Rate and Rhythm: Normal rate and regular rhythm.     Heart sounds: Normal heart sounds.  Pulmonary:     Effort: Pulmonary effort is normal.     Breath sounds: Normal breath sounds.  Musculoskeletal:     Right lower leg: No edema.     Left lower leg: No edema.     Comments: No midline spine tenderness, no midline spine step-off, no muscular back tenderness  Skin:    General: Skin is warm and dry.  Neurological:     Mental Status: He is alert.     Comments: 5/5 strength bilateral quads, hamstrings, plantar flexion, and dorsiflexion, sensation light touch intact bilateral lower extremities      Assessment/Plan: Please see individual problem list.  Hypertension Borderline.  He will check at home and return in 2 weeks for a BP check with nursing.  Continue losartan.  Rheumatoid arthritis ([place]) Patient would like follow-up lab work to confirm presence of rheumatoid arthritis.  Discussed that I would need to do a little bit of research to see if his treatment would affect the levels that we usually check.  After the patient left I was able to find information noting that  levels could remain elevated despite treatment and remission of the disease.  Orders placed.  History of prostate cancer He will continue to see urology.  Low back pain He has improved from a recent flare.  He will follow-up with orthopedics as planned.  Decreased energy Check lab work as outlined below.   Orders Placed This Encounter  Procedures  . Comp Met (CMET)    Standing Status:   Future    Standing Expiration Date:   09/09/2020  . Lipid panel    Standing Status:   Future    Standing Expiration Date:   09/09/2020  . Testosterone    Standing Status:   Future    Standing Expiration Date:   09/09/2020  . B12    Standing Status:   Future    Standing Expiration  Date:   09/09/2020  . TSH    Standing Status:   Future    Standing Expiration Date:   09/09/2020  . Rheumatoid Factor    Standing Status:   Future    Standing Expiration Date:   09/09/2020  . Cyclic citrul peptide antibody, IgG ([facility])    Standing Status:   Future    Standing Expiration Date:   09/09/2020    No orders of the defined types were placed in this encounter.    Tommi Rumps, MD [place]

## 2019-09-10 NOTE — Assessment & Plan Note (Signed)
Patient would like follow-up lab work to confirm presence of rheumatoid arthritis.  Discussed that I would need to do a little bit of research to see if his treatment would affect the levels that we usually check.  After the patient left I was able to find information noting that levels could remain elevated despite treatment and remission of the disease.  Orders placed.

## 2019-09-10 NOTE — Assessment & Plan Note (Signed)
He has improved from a recent flare.  He will follow-up with orthopedics as planned.

## 2019-09-10 NOTE — Assessment & Plan Note (Signed)
Check lab work as outlined below.

## 2019-09-10 NOTE — Patient Instructions (Signed)
Nice to see you. We will have you return for labs.  Please watch your back and if you have recurrent issues please let us know. Please start checking your blood pressure at home.  Please bring your readings to your BP check in 2 weeks.

## 2019-09-11 DIAGNOSIS — M5416 Radiculopathy, lumbar region: Secondary | ICD-10-CM | POA: Diagnosis not present

## 2019-09-21 ENCOUNTER — Ambulatory Visit: Payer: PPO | Admitting: Family Medicine

## 2019-09-24 ENCOUNTER — Other Ambulatory Visit: Payer: Self-pay

## 2019-09-25 ENCOUNTER — Ambulatory Visit: Payer: PPO | Admitting: Family Medicine

## 2019-09-25 ENCOUNTER — Ambulatory Visit (INDEPENDENT_AMBULATORY_CARE_PROVIDER_SITE_OTHER): Payer: PPO

## 2019-09-25 DIAGNOSIS — Z0000 Encounter for general adult medical examination without abnormal findings: Secondary | ICD-10-CM

## 2019-09-25 NOTE — Patient Instructions (Addendum)
  Julian Pineda , Thank you for taking time to come for your Medicare Wellness Visit. I appreciate your ongoing commitment to your health goals. Please review the following plan we discussed and let me know if I can assist you in the future.   These are the goals we discussed: Goals    . DIET - INCREASE WATER INTAKE     Place water in line of vision to help remember        This is a list of the screening recommended for you and due dates:  Health Maintenance  Topic Date Due  . Pneumonia vaccines (1 of 2 - PCV13) 01/15/2013  . Flu Shot  01/30/2020*  . Colon Cancer Screening  11/09/2020  . Tetanus Vaccine  08/11/2025  .  Hepatitis C: One time screening is recommended by Center for Disease Control  (CDC) for  adults born from [birth year] through 1963.   Completed  *Topic was postponed. The date shown is not the original due date.

## 2019-09-25 NOTE — Progress Notes (Signed)
Subjective:   Julian Pineda is a 73 y.o. male who presents for Medicare Annual/Subsequent preventive examination.  Review of Systems:  No ROS.  Medicare Wellness Virtual Visit.  Visual/audio telehealth visit, UTA vital signs.   See social history for additional risk factors.   Cardiac Risk Factors include: advanced age (>73mn, >>52women);hypertension;male gender     Objective:    Vitals: There were no vitals taken for this visit.  There is no height or weight on file to calculate BMI.  Advanced Directives 09/25/2019 09/20/2018 01/28/2018 11/09/2017 09/19/2017 06/17/2017 06/17/2016  Does Patient Have a Medical Advance Directive? Yes Yes No Yes Yes Yes Yes  Type of AParamedicof AForestburgLiving will [place]Living will - Living will Living will;Healthcare Power of Attorney Living will [place] will  Does patient want to make changes to medical advance directive? No - Patient declined No - Patient declined - - No - Patient declined - -  Copy of [place] Chart? No - copy requested No - copy requested - - No - copy requested - No - copy requested  Would patient like information on creating a medical advance directive? - - No - Patient declined - - - -    Tobacco Social History   Tobacco Use  Smoking Status Never Smoker  Smokeless Tobacco Former USystems developer . Types: Chew     Counseling given: Not Answered   Clinical Intake:  Pre-visit preparation completed: Yes        Diabetes: No  How often do you need to have someone help you when you read instructions, pamphlets, or other written materials from your doctor or pharmacy?: 1 - Never  Interpreter Needed?: No     Past Medical History:  Diagnosis Date  . Diverticulosis of colon   . GERD (gastroesophageal reflux disease)    "occas"  . History of basal cell carcinoma (BCC) excision    09-25-2014  right lower eyelid s/p moh's dx /   left lower eyelid scheduled removal 07-05-2017  . History of squamous cell carcinoma excision    01/ 2015  left lateral cheek  s/p  moh's sx  . Hypertension    sees Dr. JJackalyn Lombard [place]  . Nocturia   . OA (osteoarthritis)   . Prostate cancer ([facility] urologist-  dr eskidge/  oncologist- dr mTammi Klippel  dx 04/ 2018-- Stage T1c, Gleason 3+3,  PSA 7.1,  vol 38.6cc  . Rheumatoid arteritis ([facility]    rheumotologist-   dr bElpidio Galea . Rosacea    Dr. PSharlett Iles . Syncope 01/27/2018  . Vitamin D deficiency    Past Surgical History:  Procedure Laterality Date  . ACHILLES TENDON SURGERY Right 1984  . COLONOSCOPY  2008  . COLONOSCOPY WITH PROPOFOL N/A 11/09/2017   Procedure: COLONOSCOPY WITH PROPOFOL;  Surgeon: EManya Silvas MD;  [facility];  Service: Endoscopy;  Laterality: N/A;  . MOHS SURGERY  09-25-2014;  11-27-2013   [facility]   09-25-2014 right lower eyelid /  11-27-2013  left lateral cheek  . RADIOACTIVE SEED IMPLANT N/A 06/17/2017   Procedure: RADIOACTIVE SEED IMPLANT/BRACHYTHERAPY IMPLANT;  Surgeon: EFestus Aloe MD;  [facility]  Service: Urology;  Laterality: N/A;  . TONSILLECTOMY  child  . TOTAL KNEE ARTHROPLASTY  12/04/2012   Procedure: TOTAL KNEE ARTHROPLASTY;  Surgeon: SVickey Huger MD;  [place] [facility]  Service: Orthopedics;  Laterality: Left;  left total knee  arthroplasty  . VARICOSE VEIN SURGERY  2000   left leg   Family History  Problem Relation Age of Onset  . Cancer Mother        Multiple Myeloma  . Alcohol abuse Father   . Arthritis Father   . Prostate cancer Brother   . Breast cancer Daughter   . Hematuria Neg Hx   . Sickle cell anemia Neg Hx   . Kidney cancer Neg Hx   . Bladder Cancer Neg Hx    Social History   Socioeconomic History  . Marital status: Married    Spouse name: Not on file  . Number of children: Not on file  . Years of education: Not on file  . Highest education level: Not on file  Occupational  History  . Not on file  Social Needs  . Financial resource strain: Not hard at all  . Food insecurity    Worry: Never true    Inability: Never true  . Transportation needs    Medical: No    Non-medical: No  Tobacco Use  . Smoking status: Never Smoker  . Smokeless tobacco: Former Systems developer    Types: Chew  Substance and Sexual Activity  . Alcohol use: Yes    Alcohol/week: 14.0 standard drinks    Types: 14 Shots of liquor per week    Comment: 2 per day  . Drug use: No  . Sexual activity: Yes  Lifestyle  . Physical activity    Days per week: 3 days    Minutes per session: 60 min  . Stress: Not at all  Relationships  . Social Herbalist on phone: Not on file    Gets together: Not on file    Attends religious service: Not on file    Active member of club or organization: Not on file    Attends meetings of clubs or organizations: Not on file    Relationship status: Married  Other Topics Concern  . Not on file  Social History Narrative   Moderate alcohol use   Lives in [place] with wife, has a daughter and son   Pets has a Engineer, structural   Diet: regular    Exercise. Treadmill 5 days a week   Likes to [employer]    Outpatient Encounter Medications as of 09/25/2019  Medication Sig  . cholecalciferol (VITAMIN D) 1000 units tablet Take 1,000 Units by mouth every morning.   . folic acid (FOLVITE) 1 MG tablet TAKE 1 TABLET EVERY DAY  . losartan (COZAAR) 100 MG tablet TAKE 1 TABLET BY MOUTH DAILY  . meloxicam (MOBIC) 15 MG tablet TAKE 1 TABLET BY MOUTH ONCE DAILY WITH FOOD  . methotrexate 50 MG/2ML injection INJECT 1ML WEEKLY  . metroNIDAZOLE (METROCREAM) 0.75 % cream Apply 1 application topically daily.   . tamsulosin (FLOMAX) 0.4 MG CAPS capsule Take 1 capsule (0.4 mg total) by mouth daily after supper. (Patient not taking: Reported on 09/25/2019)   No facility-administered encounter medications on file as of 09/25/2019.     Activities of Daily  Living In your present state of health, do you have any difficulty performing the following activities: 09/25/2019  Hearing? N  Vision? N  Difficulty concentrating or making decisions? N  Walking or climbing stairs? N  Dressing or bathing? N  Doing errands, shopping? N  Preparing Food and eating ? N  Using the Toilet? N  In the past six months, have you accidently leaked urine?  N  Do you have problems with loss of bowel control? N  Managing your Medications? N  Managing your Finances? N  Housekeeping or managing your Housekeeping? N  Some recent data might be hidden    Patient Care Team: Leone Haven, MD as PCP - General (Family Medicine)   Assessment:   This is a routine wellness examination for [place].  Nurse connected with patient 09/25/19 at  8:30 AM EST by a telephone enabled telemedicine application and verified that I am speaking with the correct person using two identifiers. Patient stated full name and DOB. Patient gave permission to continue with virtual visit. Patient's location was at home and Nurse's location was at [place] office.   Health Maintenance Due: -PNA - discussed; to be completed with doctor in visit or local pharmacy.  -Hgb A1c-02/21/18 (5.4) Update all pending maintenance due as appropriate.   See completed HM at the end of note.   Eye: Visual acuity not assessed. Virtual visit. Wears corrective lenses. Followed by their ophthalmologist every 12 months.  Retinopathy- none reported  Dental: Visits every 6 months.    Hearing: Demonstrates normal hearing during visit.  Safety:  Patient feels safe at home- yes Patient does have smoke detectors at home- yes Patient does wear sunscreen or protective clothing when in direct sunlight - yes Patient does wear seat belt when in a moving vehicle - yes Patient drives- yes Adequate lighting in walkways free from debris- yes Grab bars and handrails used as appropriate- yes Ambulates with no assistive  device Cell phone or lifeline/life alert/medic alert on person when ambulating outside of the home- yes  Social: Alcohol intake - yes      Smoking history- never   Smokers in home? none Illicit drug use? none  Depression: PHQ 2 &9 complete. See screening below. Denies irritability, anhedonia, sadness/tearfullness.    Falls: See screening below.    Medication: Taking as directed and without issues.   Covid-19: Precautions and sickness symptoms discussed. Wears mask, social distancing, hand hygiene as appropriate.   Activities of Daily Living Patient denies needing assistance with: household chores, feeding themselves, getting from bed to chair, getting to the toilet, bathing/showering, dressing, managing money, or preparing meals.   Memory: Patient is alert. Patient denies difficulty focusing or concentrating. Correctly identified the president of the [place], season and recall. Patient likes to read and research for brain stimulation.   BMI- discussed the importance of a healthy diet, water intake and the benefits of aerobic exercise.  Educational material provided.  Physical activity- cardio and weight training twice weekly.  Diet:  Regular Water: fair intake Caffeine: 1.5   Other Providers Patient Care Team: Leone Haven, MD as PCP - General (Family Medicine)  Exercise Activities and Dietary recommendations Current Exercise Habits: Structured exercise class, Type of exercise: strength training/weights, Time (Minutes): 60, Frequency (Times/Week): 2, Weekly Exercise (Minutes/Week): 120, Intensity: Moderate  Goals    . DIET - INCREASE WATER INTAKE     Place water in line of vision to help remember        Fall Risk Fall Risk  09/25/2019 09/10/2019 09/20/2018 09/19/2017 03/23/2017  Falls in the past year? 0 0 0 No No  Number falls in past yr: 0 0 - - -  Follow up Falls prevention discussed;Education provided Falls evaluation completed - - -    Timed Get Up and  Go Performed: no, virtual visit  Depression Screen [facility] 2/9 Scores 09/25/2019 09/10/2019 09/20/2018 09/19/2017  PHQ -  2 Score 0 0 0 0  PHQ- 9 Score - - - 0    Cognitive Function MMSE - Mini Mental State Exam 09/19/2017  Orientation to time 5  Orientation to Place 5  Registration 3  Attention/ Calculation 5  Recall 3  Language- name 2 objects 2  Language- repeat 1  Language- follow 3 step command 3  Language- read & follow direction 1  Write a sentence 1  Copy design 1  Total score 30     6CIT Screen 09/25/2019 09/20/2018  What Year? 0 points 0 points  What month? 0 points 0 points  What time? 0 points 0 points  Count back from 20 0 points 0 points  Months in reverse 0 points 0 points  Repeat phrase 0 points 0 points  Total Score 0 0    Immunization History  Administered Date(s) Administered  . DTaP 02/19/2010  . Tdap 08/12/2015   Screening Tests Health Maintenance  Topic Date Due  . PNA vac Low Risk Adult (1 of 2 - PCV13) 01/15/2013  . INFLUENZA VACCINE  01/30/2020 (Originally 06/02/2019)  . COLONOSCOPY  11/09/2020  . TETANUS/TDAP  08/11/2025  . Hepatitis C Screening  Completed       Plan:   Keep all routine maintenance appointments.   Next scheduled lab and nurse visit 09/26/19  Follow up 01/14/20  Medicare Attestation I have personally reviewed: The patient's medical and social history Their use of alcohol, tobacco or illicit drugs Their current medications and supplements The patient's functional ability including ADLs,fall risks, home safety risks, cognitive, and hearing and visual impairment Diet and physical activities Evidence for depression   In addition, I have reviewed and discussed with patient certain preventive protocols, quality metrics, and best practice recommendations. A written personalized care plan for preventive services as well as general preventive health recommendations were provided to patient via mail.     Varney Biles, LPN  [id number]

## 2019-09-26 ENCOUNTER — Ambulatory Visit (INDEPENDENT_AMBULATORY_CARE_PROVIDER_SITE_OTHER): Payer: PPO

## 2019-09-26 ENCOUNTER — Other Ambulatory Visit: Payer: Self-pay

## 2019-09-26 ENCOUNTER — Other Ambulatory Visit (INDEPENDENT_AMBULATORY_CARE_PROVIDER_SITE_OTHER): Payer: PPO

## 2019-09-26 VITALS — BP 120/80

## 2019-09-26 DIAGNOSIS — I10 Essential (primary) hypertension: Secondary | ICD-10-CM

## 2019-09-26 DIAGNOSIS — R5383 Other fatigue: Secondary | ICD-10-CM

## 2019-09-26 DIAGNOSIS — M069 Rheumatoid arthritis, unspecified: Secondary | ICD-10-CM

## 2019-09-26 DIAGNOSIS — Z23 Encounter for immunization: Secondary | ICD-10-CM

## 2019-09-26 LAB — COMPREHENSIVE METABOLIC PANEL
ALT: 20 U/L (ref 0–53)
AST: 18 U/L (ref 0–37)
Albumin: 4 g/dL (ref 3.5–5.2)
Alkaline Phosphatase: 45 U/L (ref 39–117)
BUN: 24 mg/dL — ABNORMAL HIGH (ref 6–23)
CO2: 27 mEq/L (ref 19–32)
Calcium: 9.4 mg/dL (ref 8.4–10.5)
Chloride: 102 mEq/L (ref 96–112)
Creatinine, Ser: 1.08 mg/dL (ref 0.40–1.50)
GFR: 67.27 mL/min (ref >60.00)
Glucose, Bld: 98 mg/dL (ref 70–99)
Potassium: 4.4 mEq/L (ref 3.5–5.1)
Sodium: 137 mEq/L (ref 135–145)
Total Bilirubin: 0.8 mg/dL (ref 0.2–1.2)
Total Protein: 6.2 g/dL (ref 6.0–8.3)

## 2019-09-26 LAB — LIPID PANEL
Cholesterol: 198 mg/dL (ref 0–200)
HDL: 79.5 mg/dL (ref >39.00)
LDL Cholesterol: 106 mg/dL — ABNORMAL HIGH (ref 0–99)
NonHDL: 118.27
Total CHOL/HDL Ratio: 2
Triglycerides: 62 mg/dL (ref 0.0–149.0)
VLDL: 12.4 mg/dL (ref 0.0–40.0)

## 2019-09-26 LAB — TSH: TSH: 3.39 u[IU]/mL (ref 0.35–4.50)

## 2019-09-26 LAB — VITAMIN B12: Vitamin B-12: 449 pg/mL (ref 211–911)

## 2019-09-26 LAB — TESTOSTERONE: Testosterone: 355.64 ng/dL (ref 300.00–890.00)

## 2019-09-26 NOTE — Progress Notes (Addendum)
Patient here for nurse visit BP check per order from Dr. Caryl Bis   Patient reports compliance with prescribed BP medications: yes  Last dose of BP medication: Last night   BP Readings from Last 3 Encounters:  09/26/19 120/80  09/10/19 140/80  09/20/18 130/76   Pulse Readings from Last 3 Encounters:  09/10/19 75  09/20/18 73  09/20/18 73    Per Dr.Tracey Mclean  Follow current regimen and schedule f/up with provider.  Nina,cma   Patient verbalized understanding of instructions.   Gordy Councilman, CMA   Agree [place]

## 2019-09-28 LAB — CYCLIC CITRUL PEPTIDE ANTIBODY, IGG: Cyclic Citrullin Peptide Ab: 43 UNITS — ABNORMAL HIGH

## 2019-09-28 LAB — RHEUMATOID FACTOR: Rhuematoid fact SerPl-aCnc: <14 IU/mL (ref <14)

## 2019-10-01 ENCOUNTER — Telehealth: Payer: Self-pay

## 2019-10-01 DIAGNOSIS — Z8546 Personal history of malignant neoplasm of prostate: Secondary | ICD-10-CM | POA: Diagnosis not present

## 2019-10-01 DIAGNOSIS — M19049 Primary osteoarthritis, unspecified hand: Secondary | ICD-10-CM | POA: Diagnosis not present

## 2019-10-01 DIAGNOSIS — Z79899 Other long term (current) drug therapy: Secondary | ICD-10-CM | POA: Diagnosis not present

## 2019-10-01 DIAGNOSIS — E782 Mixed hyperlipidemia: Secondary | ICD-10-CM | POA: Diagnosis not present

## 2019-10-01 DIAGNOSIS — Z6828 Body mass index (BMI) 28.0-28.9, adult: Secondary | ICD-10-CM | POA: Diagnosis not present

## 2019-10-01 DIAGNOSIS — E663 Overweight: Secondary | ICD-10-CM | POA: Diagnosis not present

## 2019-10-01 DIAGNOSIS — R5382 Chronic fatigue, unspecified: Secondary | ICD-10-CM | POA: Diagnosis not present

## 2019-10-01 DIAGNOSIS — M0589 Other rheumatoid arthritis with rheumatoid factor of multiple sites: Secondary | ICD-10-CM | POA: Diagnosis not present

## 2019-10-01 NOTE — Telephone Encounter (Signed)
I faxed the labs to Dr. Valora Piccolo office.  Nina,cma

## 2019-10-01 NOTE — Telephone Encounter (Unsigned)
Copied from [place] [id number]. Topic: Medical Record Request - Provider/Facility Request >> Oct 01, 2019  8:54 AM Burchel, Abbi R wrote: Caryl Pina (Dr [facility]) called to request pt's most recent labs. Pt is in office currently.  Please fax to: [phone]

## 2019-10-01 NOTE — Telephone Encounter (Signed)
Copied from [place] [id number]. Topic: Medical Record Request - Provider/Facility Request >> Oct 01, 2019  8:54 AM Burchel, Abbi R wrote: Caryl Pina (Dr [facility]) called to request pt's most recent labs. Pt is in office currently.  Please fax to: [phone]

## 2019-11-12 ENCOUNTER — Telehealth: Payer: Self-pay | Admitting: Family Medicine

## 2019-11-12 NOTE — Telephone Encounter (Signed)
Pt's wife called and stated that pt has been experiencing nausea and fatigue for the past 2-3 weeks. Pt wanted appt today. I offered her an appt for another day -she did not want that. I let her know that he may want to go to urgent care> she wants a call back today. Please advise

## 2019-11-12 NOTE — Telephone Encounter (Signed)
He could be offered an appointment with another provider in [place] if available if they want to be seen today. If they decline that I could add him on at 1 pm on 11/12/18 for a virtual visit.

## 2019-11-12 NOTE — Telephone Encounter (Signed)
Patient had another appointment and none else had any openings, I scheduled him in a 15 mins slot for this Friday with you, he states he just is having fatigue and nausea when he eats. If this slot is not appropriate please let me know.    [name],cma

## 2019-11-12 NOTE — Telephone Encounter (Signed)
That time should be fine.

## 2019-11-15 DIAGNOSIS — M9903 Segmental and somatic dysfunction of lumbar region: Secondary | ICD-10-CM | POA: Diagnosis not present

## 2019-11-15 DIAGNOSIS — M5416 Radiculopathy, lumbar region: Secondary | ICD-10-CM | POA: Diagnosis not present

## 2019-11-15 DIAGNOSIS — M62830 Muscle spasm of back: Secondary | ICD-10-CM | POA: Diagnosis not present

## 2019-11-15 DIAGNOSIS — M5136 Other intervertebral disc degeneration, lumbar region: Secondary | ICD-10-CM | POA: Diagnosis not present

## 2019-11-16 ENCOUNTER — Encounter: Payer: Self-pay | Admitting: Family Medicine

## 2019-11-16 ENCOUNTER — Other Ambulatory Visit: Payer: Self-pay

## 2019-11-16 ENCOUNTER — Ambulatory Visit (INDEPENDENT_AMBULATORY_CARE_PROVIDER_SITE_OTHER): Payer: PPO | Admitting: Family Medicine

## 2019-11-16 DIAGNOSIS — M5416 Radiculopathy, lumbar region: Secondary | ICD-10-CM | POA: Diagnosis not present

## 2019-11-16 DIAGNOSIS — Z7289 Other problems related to lifestyle: Secondary | ICD-10-CM

## 2019-11-16 DIAGNOSIS — M62830 Muscle spasm of back: Secondary | ICD-10-CM | POA: Diagnosis not present

## 2019-11-16 DIAGNOSIS — M5136 Other intervertebral disc degeneration, lumbar region: Secondary | ICD-10-CM | POA: Diagnosis not present

## 2019-11-16 DIAGNOSIS — R110 Nausea: Secondary | ICD-10-CM

## 2019-11-16 DIAGNOSIS — F1011 Alcohol abuse, in remission: Secondary | ICD-10-CM | POA: Insufficient documentation

## 2019-11-16 DIAGNOSIS — R5382 Chronic fatigue, unspecified: Secondary | ICD-10-CM | POA: Diagnosis not present

## 2019-11-16 DIAGNOSIS — Z789 Other specified health status: Secondary | ICD-10-CM | POA: Insufficient documentation

## 2019-11-16 DIAGNOSIS — M9903 Segmental and somatic dysfunction of lumbar region: Secondary | ICD-10-CM | POA: Diagnosis not present

## 2019-11-16 MED ORDER — FAMOTIDINE 10 MG PO TABS: 10.0000 mg | ORAL_TABLET | Freq: Two times a day (BID) | ORAL | 0 refills | Status: DC

## 2019-11-16 NOTE — Assessment & Plan Note (Signed)
Patient with continued fatigue some nausea and lack of appetite as well as some weight loss.  His PHQ-9 would indicate at least mild depression.  Discussed this is a potential cause.  Discussed starting a medication for that though he was hesitant.  He may have some reflux that is contributing to the nausea and there could possibly be some gastritis contributing as well though he has no abdominal pain.  The patient was willing to try medication to treat reflux.  I attempted to prescribe Protonix though it appears that it may interact with his methotrexate and I will check with our clinical pharmacist on that.  We will trial Pepcid.  Discussed follow-up in 3 weeks and that we could consider adding an antidepressant at that time if symptoms are not improving.  Lab work was previously checked and and was unrevealing for cause of his fatigue.  He is up-to-date on colon cancer screening.  He reports a adequate PSA of less than 1 when he last saw his urologist.  Discussed reasons to seek medical attention the emergency department regarding suicidal ideation.

## 2019-11-16 NOTE — Progress Notes (Signed)
Virtual Visit via video Note  This visit type was conducted due to national recommendations for restrictions regarding the COVID-19 pandemic (e.g. social distancing).  This format is felt to be most appropriate for this patient at this time.  All issues noted in this document were discussed and addressed.  No physical exam was performed (except for noted visual exam findings with Video Visits).   I connected with Julian Pineda today at  3:15 PM EST by a video enabled telemedicine application or telephone and verified that I am speaking with the correct person using two identifiers. Location patient: home Location provider: work Persons participating in the virtual visit: patient, provider, Kron Everton (wife)  I discussed the limitations, risks, security and privacy concerns of performing an evaluation and management service by telephone and the availability of in person appointments. I also discussed with the patient that there may be a patient responsible charge related to this service. The patient expressed understanding and agreed to proceed.  Reason for visit: Same day visit.  HPI: Fatigue: Patient notes this has been an ongoing issue since our last visit.  He has subsequently developed some nausea.  He has also had a loss of appetite later in the day.  His wife does note that he has had some odor to his breath and wonders if he has had some issues with reflux.  He is down about 9 pounds over the past month.  He is getting full more easily though he is able to eat full meals.  Oftentimes he will not eat anything later in the day.  He denies any burning reflux sensation.  No abdominal pain.  No vomiting.  In the past he has had diarrhea though more recently has had more solid stools.  He does burp some.  No blood in his stool.  He has had some minimal sweats on 1 or 2 occasions at night and notes it was warm in the house on one of the occasions.  No drenching sweats.  No itching.  No chest  pain.  No shortness of breath.  He denies anxiety though does note he has had much less interest in things and may be dealing with some depression.  He was drinking 3-4 alcoholic beverages a day though he has cut down over the last month and a half or so.  He does still occasionally drink some alcohol.  On day 3 or 4 after stopping his alcohol intake previously he did get quite anxious.  Notes that significant anxiety resolved pretty quickly.  He has noted no tremor or shakiness.  No sweatiness.  He does report a mild yellow coating on his tongue that he does not think he has ever been there previously.  He notes no recent prednisone biotics.   ROS: See pertinent positives and negatives per HPI.  Past Medical History:  Diagnosis Date  . Diverticulosis of colon   . GERD (gastroesophageal reflux disease)    "occas"  . History of basal cell carcinoma (BCC) excision    09-25-2014  right lower eyelid s/p moh's dx /  left lower eyelid scheduled removal 07-05-2017  . History of squamous cell carcinoma excision    01/ 2015  left lateral cheek  s/p  moh's sx  . Hypertension    sees Dr. Jackalyn Lombard, [place]  . Nocturia   . OA (osteoarthritis)   . Prostate cancer [facility]) urologist-  dr eskidge/  oncologist- dr [name]   dx 04/ 2018-- Stage T1c, Gleason  3+3,  PSA 7.1,  vol 38.6cc  . Rheumatoid arteritis ([place])    rheumotologist-   dr Elpidio Galea  . Rosacea    Dr. Sharlett Iles  . Syncope 01/27/2018  . Vitamin D deficiency     Past Surgical History:  Procedure Laterality Date  . ACHILLES TENDON SURGERY Right 1984  . COLONOSCOPY  2008  . COLONOSCOPY WITH PROPOFOL N/A 11/09/2017   Procedure: COLONOSCOPY WITH PROPOFOL;  Surgeon: Manya Silvas, MD;  [facility];  Service: Endoscopy;  Laterality: N/A;  . MOHS SURGERY  09-25-2014;  11-27-2013   [facility]   09-25-2014 right lower eyelid /  11-27-2013  left lateral cheek  . RADIOACTIVE SEED IMPLANT N/A 06/17/2017   Procedure: RADIOACTIVE SEED  IMPLANT/BRACHYTHERAPY IMPLANT;  Surgeon: Festus Aloe, MD;  Location: [facility];  Service: Urology;  Laterality: N/A;  . TONSILLECTOMY  child  . TOTAL KNEE ARTHROPLASTY  12/04/2012   Procedure: TOTAL KNEE ARTHROPLASTY;  Surgeon: Vickey Huger, MD;  Location: [place];  Service: Orthopedics;  Laterality: Left;  left total knee arthroplasty  . VARICOSE VEIN SURGERY  2000   left leg    Family History  Problem Relation Age of Onset  . Cancer Mother        Multiple Myeloma  . Alcohol abuse Father   . Arthritis Father   . Prostate cancer Brother   . Breast cancer Daughter   . Hematuria Neg Hx   . Sickle cell anemia Neg Hx   . Kidney cancer Neg Hx   . Bladder Cancer Neg Hx     SOCIAL HX: Non-smoker   Current Outpatient Medications:  .  cholecalciferol (VITAMIN D) 1000 units tablet, Take 1,000 Units by mouth every morning. , Disp: , Rfl:  .  folic acid (FOLVITE) 1 MG tablet, TAKE 1 TABLET EVERY DAY, Disp: 90 tablet, Rfl: 0 .  losartan (COZAAR) 100 MG tablet, TAKE 1 TABLET BY MOUTH DAILY, Disp: 90 tablet, Rfl: 1 .  meloxicam (MOBIC) 15 MG tablet, TAKE 1 TABLET BY MOUTH ONCE DAILY WITH FOOD, Disp: 30 tablet, Rfl: 2 .  methotrexate 50 MG/2ML injection, INJECT 1ML WEEKLY, Disp: 4 mL, Rfl: 6 .  metroNIDAZOLE (METROCREAM) 0.75 % cream, Apply 1 application topically daily. , Disp: , Rfl:  .  famotidine (PEPCID) 10 MG tablet, Take 1 tablet (10 mg total) by mouth 2 (two) times daily., Disp: 28 tablet, Rfl: 0  EXAM:  VITALS per patient if applicable:  GENERAL: alert, oriented, appears well and in no acute distress  HEENT: atraumatic, conjunttiva clear, no obvious abnormalities on inspection of external nose and ears  NECK: normal movements of the head and neck  LUNGS: on inspection no signs of respiratory distress, breathing rate appears normal, no obvious gross SOB, gasping or wheezing  CV: no obvious cyanosis  MS: moves all visible extremities without noticeable  abnormality  PSYCH/NEURO: pleasant and cooperative, no obvious depression or anxiety, speech and thought processing grossly intact  ASSESSMENT AND PLAN:  Discussed the following assessment and plan:  Fatigue Patient with continued fatigue some nausea and lack of appetite as well as some weight loss.  His PHQ-9 would indicate at least mild depression.  Discussed this is a potential cause.  Discussed starting a medication for that though he was hesitant.  He may have some reflux that is contributing to the nausea and there could possibly be some gastritis contributing as well though he has no abdominal pain.  The patient was willing to try medication to  treat reflux.  I attempted to prescribe Protonix though it appears that it may interact with his methotrexate and I will check with our clinical pharmacist on that.  We will trial Pepcid.  Discussed follow-up in 3 weeks and that we could consider adding an antidepressant at that time if symptoms are not improving.  Lab work was previously checked and and was unrevealing for cause of his fatigue.  He is up-to-date on colon cancer screening.  He reports a adequate PSA of less than 1 when he last saw his urologist.  Discussed reasons to seek medical attention the emergency department regarding suicidal ideation.  Nausea Trial of Pepcid.  We will see if this is improving.  Depression could be contributing.  Alcohol use Patient with prior excessive alcohol use.  He cut down on his own and did so fairly suddenly.  I suspect he had some mild withdrawal symptoms when he got excessively anxious.  Advised that he should not increase to the level he was drinking previously.  Discussed that if he ever did decide to cut back on excessive alcohol use he should not do so suddenly given the risk of withdrawal and seizures.   No orders of the defined types were placed in this encounter.   Meds ordered this encounter  Medications  . famotidine (PEPCID) 10 MG  tablet    Sig: Take 1 tablet (10 mg total) by mouth 2 (two) times daily.    Dispense:  28 tablet    Refill:  0     I discussed the assessment and treatment plan with the patient. The patient was provided an opportunity to ask questions and all were answered. The patient agreed with the plan and demonstrated an understanding of the instructions.   The patient was advised to call back or seek an in-person evaluation if the symptoms worsen or if the condition fails to improve as anticipated.  I have spent 41 minutes in the care of this patient regarding normal previsit chart review, recent lab review, history taking, discussion of medications, counseling on alcohol use, and documentation.   Tommi Rumps, MD

## 2019-11-16 NOTE — Assessment & Plan Note (Signed)
Trial of Pepcid.  We will see if this is improving.  Depression could be contributing.

## 2019-11-16 NOTE — Assessment & Plan Note (Signed)
Patient with prior excessive alcohol use.  He cut down on his own and did so fairly suddenly.  I suspect he had some mild withdrawal symptoms when he got excessively anxious.  Advised that he should not increase to the level he was drinking previously.  Discussed that if he ever did decide to cut back on excessive alcohol use he should not do so suddenly given the risk of withdrawal and seizures.

## 2019-11-19 DIAGNOSIS — M9903 Segmental and somatic dysfunction of lumbar region: Secondary | ICD-10-CM | POA: Diagnosis not present

## 2019-11-19 DIAGNOSIS — M5416 Radiculopathy, lumbar region: Secondary | ICD-10-CM | POA: Diagnosis not present

## 2019-11-19 DIAGNOSIS — M62830 Muscle spasm of back: Secondary | ICD-10-CM | POA: Diagnosis not present

## 2019-11-19 DIAGNOSIS — M5136 Other intervertebral disc degeneration, lumbar region: Secondary | ICD-10-CM | POA: Diagnosis not present

## 2019-11-21 DIAGNOSIS — M5136 Other intervertebral disc degeneration, lumbar region: Secondary | ICD-10-CM | POA: Diagnosis not present

## 2019-11-21 DIAGNOSIS — M5416 Radiculopathy, lumbar region: Secondary | ICD-10-CM | POA: Diagnosis not present

## 2019-11-21 DIAGNOSIS — M62830 Muscle spasm of back: Secondary | ICD-10-CM | POA: Diagnosis not present

## 2019-11-21 DIAGNOSIS — M9903 Segmental and somatic dysfunction of lumbar region: Secondary | ICD-10-CM | POA: Diagnosis not present

## 2019-11-22 ENCOUNTER — Telehealth: Payer: Self-pay | Admitting: Family Medicine

## 2019-11-22 DIAGNOSIS — M9903 Segmental and somatic dysfunction of lumbar region: Secondary | ICD-10-CM | POA: Diagnosis not present

## 2019-11-22 DIAGNOSIS — M5136 Other intervertebral disc degeneration, lumbar region: Secondary | ICD-10-CM | POA: Diagnosis not present

## 2019-11-22 DIAGNOSIS — M62830 Muscle spasm of back: Secondary | ICD-10-CM | POA: Diagnosis not present

## 2019-11-22 DIAGNOSIS — M5416 Radiculopathy, lumbar region: Secondary | ICD-10-CM | POA: Diagnosis not present

## 2019-11-22 NOTE — Telephone Encounter (Signed)
-----   Message from De Hollingshead, [facility] sent at 11/19/2019  8:28 AM EST ----- Marykay Lex -   It is an issue, but looks like more with cancer doses of MTX, rather than rheum doses.   I say see if the pepcid helps. If not, try a short course of omeprazole. I'd recommend he at least let Dr. Amil Amen know he's on a course of PPI.   Catie ----- Message ----- From: Leone Haven, MD Sent: 11/16/2019   7:18 PM EST To: De Hollingshead, Christine Catie,   I tried to prescribe this patient omeprazole and received a warning about PPIs and methotrexate. I wanted to see if you knew anything about this and see if it was an interaction to be worried about prior to me prescribing the omeprazole. Thanks.   Randall Hiss

## 2019-11-22 NOTE — Telephone Encounter (Signed)
See documentation below in case we need to try omeprazole.

## 2019-11-23 DIAGNOSIS — L821 Other seborrheic keratosis: Secondary | ICD-10-CM | POA: Diagnosis not present

## 2019-11-23 DIAGNOSIS — D2261 Melanocytic nevi of right upper limb, including shoulder: Secondary | ICD-10-CM | POA: Diagnosis not present

## 2019-11-23 DIAGNOSIS — D225 Melanocytic nevi of trunk: Secondary | ICD-10-CM | POA: Diagnosis not present

## 2019-11-23 DIAGNOSIS — D2272 Melanocytic nevi of left lower limb, including hip: Secondary | ICD-10-CM | POA: Diagnosis not present

## 2019-11-23 DIAGNOSIS — X32XXXA Exposure to sunlight, initial encounter: Secondary | ICD-10-CM | POA: Diagnosis not present

## 2019-11-23 DIAGNOSIS — Z85828 Personal history of other malignant neoplasm of skin: Secondary | ICD-10-CM | POA: Diagnosis not present

## 2019-11-23 DIAGNOSIS — L570 Actinic keratosis: Secondary | ICD-10-CM | POA: Diagnosis not present

## 2019-11-23 DIAGNOSIS — D0439 Carcinoma in situ of skin of other parts of face: Secondary | ICD-10-CM | POA: Diagnosis not present

## 2019-11-23 DIAGNOSIS — D485 Neoplasm of uncertain behavior of skin: Secondary | ICD-10-CM | POA: Diagnosis not present

## 2019-11-23 DIAGNOSIS — L718 Other rosacea: Secondary | ICD-10-CM | POA: Diagnosis not present

## 2019-11-26 DIAGNOSIS — M5136 Other intervertebral disc degeneration, lumbar region: Secondary | ICD-10-CM | POA: Diagnosis not present

## 2019-11-26 DIAGNOSIS — M5416 Radiculopathy, lumbar region: Secondary | ICD-10-CM | POA: Diagnosis not present

## 2019-11-26 DIAGNOSIS — M9903 Segmental and somatic dysfunction of lumbar region: Secondary | ICD-10-CM | POA: Diagnosis not present

## 2019-11-26 DIAGNOSIS — M62830 Muscle spasm of back: Secondary | ICD-10-CM | POA: Diagnosis not present

## 2019-11-28 DIAGNOSIS — M5416 Radiculopathy, lumbar region: Secondary | ICD-10-CM | POA: Diagnosis not present

## 2019-11-28 DIAGNOSIS — M9903 Segmental and somatic dysfunction of lumbar region: Secondary | ICD-10-CM | POA: Diagnosis not present

## 2019-11-28 DIAGNOSIS — M62830 Muscle spasm of back: Secondary | ICD-10-CM | POA: Diagnosis not present

## 2019-11-28 DIAGNOSIS — M5136 Other intervertebral disc degeneration, lumbar region: Secondary | ICD-10-CM | POA: Diagnosis not present

## 2019-11-30 DIAGNOSIS — M62830 Muscle spasm of back: Secondary | ICD-10-CM | POA: Diagnosis not present

## 2019-11-30 DIAGNOSIS — M5136 Other intervertebral disc degeneration, lumbar region: Secondary | ICD-10-CM | POA: Diagnosis not present

## 2019-11-30 DIAGNOSIS — M5416 Radiculopathy, lumbar region: Secondary | ICD-10-CM | POA: Diagnosis not present

## 2019-11-30 DIAGNOSIS — M9903 Segmental and somatic dysfunction of lumbar region: Secondary | ICD-10-CM | POA: Diagnosis not present

## 2019-12-04 DIAGNOSIS — M62830 Muscle spasm of back: Secondary | ICD-10-CM | POA: Diagnosis not present

## 2019-12-04 DIAGNOSIS — M5416 Radiculopathy, lumbar region: Secondary | ICD-10-CM | POA: Diagnosis not present

## 2019-12-04 DIAGNOSIS — M5136 Other intervertebral disc degeneration, lumbar region: Secondary | ICD-10-CM | POA: Diagnosis not present

## 2019-12-04 DIAGNOSIS — M9903 Segmental and somatic dysfunction of lumbar region: Secondary | ICD-10-CM | POA: Diagnosis not present

## 2019-12-07 DIAGNOSIS — M5136 Other intervertebral disc degeneration, lumbar region: Secondary | ICD-10-CM | POA: Diagnosis not present

## 2019-12-07 DIAGNOSIS — M5416 Radiculopathy, lumbar region: Secondary | ICD-10-CM | POA: Diagnosis not present

## 2019-12-07 DIAGNOSIS — M62830 Muscle spasm of back: Secondary | ICD-10-CM | POA: Diagnosis not present

## 2019-12-07 DIAGNOSIS — M9903 Segmental and somatic dysfunction of lumbar region: Secondary | ICD-10-CM | POA: Diagnosis not present

## 2019-12-11 DIAGNOSIS — M62830 Muscle spasm of back: Secondary | ICD-10-CM | POA: Diagnosis not present

## 2019-12-11 DIAGNOSIS — M9903 Segmental and somatic dysfunction of lumbar region: Secondary | ICD-10-CM | POA: Diagnosis not present

## 2019-12-11 DIAGNOSIS — M5136 Other intervertebral disc degeneration, lumbar region: Secondary | ICD-10-CM | POA: Diagnosis not present

## 2019-12-11 DIAGNOSIS — M5416 Radiculopathy, lumbar region: Secondary | ICD-10-CM | POA: Diagnosis not present

## 2019-12-14 ENCOUNTER — Ambulatory Visit: Payer: PPO | Attending: Internal Medicine

## 2019-12-14 DIAGNOSIS — M62830 Muscle spasm of back: Secondary | ICD-10-CM | POA: Diagnosis not present

## 2019-12-14 DIAGNOSIS — M5416 Radiculopathy, lumbar region: Secondary | ICD-10-CM | POA: Diagnosis not present

## 2019-12-14 DIAGNOSIS — Z23 Encounter for immunization: Secondary | ICD-10-CM

## 2019-12-14 DIAGNOSIS — M5136 Other intervertebral disc degeneration, lumbar region: Secondary | ICD-10-CM | POA: Diagnosis not present

## 2019-12-14 DIAGNOSIS — M9903 Segmental and somatic dysfunction of lumbar region: Secondary | ICD-10-CM | POA: Diagnosis not present

## 2019-12-14 NOTE — Progress Notes (Signed)
   Covid-19 Vaccination Clinic  Name:  Julian Pineda    MRN: [id number] DOB: 1945-12-24  12/14/2019  [name] was observed post Covid-19 immunization for 15 minutes without incidence. He was provided with Vaccine Information Sheet and instruction to access the V-Safe system.   [name] was instructed to call 911 with any severe reactions post vaccine: Marland Kitchen Difficulty breathing  . Swelling of your face and throat  . A fast heartbeat  . A bad rash all over your body  . Dizziness and weakness    Immunizations Administered    Name Date Dose VIS Date Route   Pfizer COVID-19 Vaccine 12/14/2019 11:08 AM 0.3 mL 10/12/2019 Intramuscular   Manufacturer: [place]   Lot: [id number]   [place]: [id number]

## 2019-12-17 ENCOUNTER — Other Ambulatory Visit: Payer: Self-pay

## 2019-12-17 ENCOUNTER — Ambulatory Visit (INDEPENDENT_AMBULATORY_CARE_PROVIDER_SITE_OTHER): Payer: PPO | Admitting: Family Medicine

## 2019-12-17 ENCOUNTER — Encounter: Payer: Self-pay | Admitting: Family Medicine

## 2019-12-17 DIAGNOSIS — R110 Nausea: Secondary | ICD-10-CM

## 2019-12-17 DIAGNOSIS — R5382 Chronic fatigue, unspecified: Secondary | ICD-10-CM

## 2019-12-17 MED ORDER — FAMOTIDINE 10 MG PO TABS: 10.0000 mg | ORAL_TABLET | Freq: Two times a day (BID) | ORAL | 2 refills | Status: DC

## 2019-12-17 NOTE — Assessment & Plan Note (Addendum)
Potentially reflux related given that the Pepcid seemed to help his symptoms.  He can continue on Pepcid at this time.  This was sent to his pharmacy.  Discussed every month or 2 he could trial coming off of this to see if he still needed the medication.

## 2019-12-17 NOTE — Progress Notes (Signed)
Virtual Visit via video Note  This visit type was conducted due to national recommendations for restrictions regarding the COVID-19 pandemic (e.g. social distancing).  This format is felt to be most appropriate for this patient at this time.  All issues noted in this document were discussed and addressed.  No physical exam was performed (except for noted visual exam findings with Video Visits).   I connected with Julian Pineda today at 11:00 AM EST by a video enabled telemedicine application and verified that I am speaking with the correct person using two identifiers. Location patient: home Location provider: work Persons participating in the virtual visit: patient, provider, Nickolus Wadding (wife)  I discussed the limitations, risks, security and privacy concerns of performing an evaluation and management service by telephone and the availability of in person appointments. I also discussed with the patient that there may be a patient responsible charge related to this service. The patient expressed understanding and agreed to proceed.   Reason for visit: f/u  HPI: Fatigue/reflux: Patient notes fatigue has improved.  Notes no depression.  He notes at first it did not seem like the Pepcid was helpful though he ran out several days ago and notes he noticed a big difference.  He notes some very mild nausea that improved with the Pepcid.  No vomiting.  No diarrhea.  No abdominal pain.  He started to avoid dairy and wheat and notes that made a big difference as well.   ROS: See pertinent positives and negatives per HPI.  Past Medical History:  Diagnosis Date  . Diverticulosis of colon   . GERD (gastroesophageal reflux disease)    "occas"  . History of basal cell carcinoma (BCC) excision    09-25-2014  right lower eyelid s/p moh's dx /  left lower eyelid scheduled removal 07-05-2017  . History of squamous cell carcinoma excision    01/ 2015  left lateral cheek  s/p  moh's sx  .  Hypertension    sees Dr. Jackalyn Lombard, [place]  . Nocturia   . OA (osteoarthritis)   . Prostate cancer [facility]) urologist-  dr eskidge/  oncologist- dr [name]   dx 04/ 2018-- Stage T1c, Gleason 3+3,  PSA 7.1,  vol 38.6cc  . Rheumatoid arteritis ([place])    rheumotologist-   dr Elpidio Galea  . Rosacea    Dr. Sharlett Iles  . Syncope 01/27/2018  . Vitamin D deficiency     Past Surgical History:  Procedure Laterality Date  . ACHILLES TENDON SURGERY Right 1984  . COLONOSCOPY  2008  . COLONOSCOPY WITH PROPOFOL N/A 11/09/2017   Procedure: COLONOSCOPY WITH PROPOFOL;  Surgeon: Manya Silvas, MD;  Location: [facility] ENDOSCOPY;  Service: Endoscopy;  Laterality: N/A;  . MOHS SURGERY  09-25-2014;  11-27-2013   [facility]   09-25-2014 right lower eyelid /  11-27-2013  left lateral cheek  . RADIOACTIVE SEED IMPLANT N/A 06/17/2017   Procedure: RADIOACTIVE SEED IMPLANT/BRACHYTHERAPY IMPLANT;  Surgeon: Festus Aloe, MD;  Location: [facility];  Service: Urology;  Laterality: N/A;  . TONSILLECTOMY  child  . TOTAL KNEE ARTHROPLASTY  12/04/2012   Procedure: TOTAL KNEE ARTHROPLASTY;  Surgeon: Vickey Huger, MD;  Location: [place];  Service: Orthopedics;  Laterality: Left;  left total knee arthroplasty  . VARICOSE VEIN SURGERY  2000   left leg    Family History  Problem Relation Age of Onset  . Cancer Mother        Multiple Myeloma  . Alcohol abuse Father   .  Arthritis Father   . Prostate cancer Brother   . Breast cancer Daughter   . Hematuria Neg Hx   . Sickle cell anemia Neg Hx   . Kidney cancer Neg Hx   . Bladder Cancer Neg Hx     SOCIAL HX: Non-smoker   Current Outpatient Medications:  .  cholecalciferol (VITAMIN D) 1000 units tablet, Take 1,000 Units by mouth every morning. , Disp: , Rfl:  .  famotidine (PEPCID) 10 MG tablet, Take 1 tablet (10 mg total) by mouth 2 (two) times daily., Disp: 60 tablet, Rfl: 2 .  folic acid (FOLVITE) 1 MG tablet, TAKE 1 TABLET EVERY DAY, Disp: 90 tablet,  Rfl: 0 .  losartan (COZAAR) 100 MG tablet, TAKE 1 TABLET BY MOUTH DAILY, Disp: 90 tablet, Rfl: 1 .  meloxicam (MOBIC) 15 MG tablet, TAKE 1 TABLET BY MOUTH ONCE DAILY WITH FOOD, Disp: 30 tablet, Rfl: 2 .  methotrexate 50 MG/2ML injection, INJECT 1ML WEEKLY, Disp: 4 mL, Rfl: 6 .  metroNIDAZOLE (METROCREAM) 0.75 % cream, Apply 1 application topically daily. , Disp: , Rfl:   EXAM:  VITALS per patient if applicable:  GENERAL: alert, oriented, appears well and in no acute distress  HEENT: atraumatic, conjunttiva clear, no obvious abnormalities on inspection of external nose and ears  NECK: normal movements of the head and neck  LUNGS: on inspection no signs of respiratory distress, breathing rate appears normal, no obvious gross SOB, gasping or wheezing  CV: no obvious cyanosis  MS: moves all visible extremities without noticeable abnormality  PSYCH/NEURO: pleasant and cooperative, no obvious depression or anxiety, speech and thought processing grossly intact  ASSESSMENT AND PLAN:  Discussed the following assessment and plan:  Fatigue Improved.  There may be some dietary driven factors here.  Encouraged him to avoid dairy and wheat as he has been.  Nausea Potentially reflux related given that the Pepcid seemed to help his symptoms.  He can continue on Pepcid at this time.  This was sent to his pharmacy.  Discussed every month or 2 he could trial coming off of this to see if he still needed the medication.   No orders of the defined types were placed in this encounter.   Meds ordered this encounter  Medications  . famotidine (PEPCID) 10 MG tablet    Sig: Take 1 tablet (10 mg total) by mouth 2 (two) times daily.    Dispense:  60 tablet    Refill:  2     I discussed the assessment and treatment plan with the patient. The patient was provided an opportunity to ask questions and all were answered. The patient agreed with the plan and demonstrated an understanding of the  instructions.   The patient was advised to call back or seek an in-person evaluation if the symptoms worsen or if the condition fails to improve as anticipated.   Tommi Rumps, MD

## 2019-12-17 NOTE — Assessment & Plan Note (Signed)
Improved.  There may be some dietary driven factors here.  Encouraged him to avoid dairy and wheat as he has been.

## 2019-12-18 DIAGNOSIS — D0439 Carcinoma in situ of skin of other parts of face: Secondary | ICD-10-CM | POA: Diagnosis not present

## 2019-12-19 ENCOUNTER — Other Ambulatory Visit: Payer: Self-pay | Admitting: Family Medicine

## 2019-12-19 DIAGNOSIS — M62830 Muscle spasm of back: Secondary | ICD-10-CM | POA: Diagnosis not present

## 2019-12-19 DIAGNOSIS — M9903 Segmental and somatic dysfunction of lumbar region: Secondary | ICD-10-CM | POA: Diagnosis not present

## 2019-12-19 DIAGNOSIS — M5136 Other intervertebral disc degeneration, lumbar region: Secondary | ICD-10-CM | POA: Diagnosis not present

## 2019-12-19 DIAGNOSIS — M5416 Radiculopathy, lumbar region: Secondary | ICD-10-CM | POA: Diagnosis not present

## 2019-12-24 DIAGNOSIS — M5136 Other intervertebral disc degeneration, lumbar region: Secondary | ICD-10-CM | POA: Diagnosis not present

## 2019-12-24 DIAGNOSIS — M62830 Muscle spasm of back: Secondary | ICD-10-CM | POA: Diagnosis not present

## 2019-12-24 DIAGNOSIS — M5416 Radiculopathy, lumbar region: Secondary | ICD-10-CM | POA: Diagnosis not present

## 2019-12-24 DIAGNOSIS — M9903 Segmental and somatic dysfunction of lumbar region: Secondary | ICD-10-CM | POA: Diagnosis not present

## 2019-12-28 DIAGNOSIS — M62830 Muscle spasm of back: Secondary | ICD-10-CM | POA: Diagnosis not present

## 2019-12-28 DIAGNOSIS — M5136 Other intervertebral disc degeneration, lumbar region: Secondary | ICD-10-CM | POA: Diagnosis not present

## 2019-12-28 DIAGNOSIS — M5416 Radiculopathy, lumbar region: Secondary | ICD-10-CM | POA: Diagnosis not present

## 2019-12-28 DIAGNOSIS — M9903 Segmental and somatic dysfunction of lumbar region: Secondary | ICD-10-CM | POA: Diagnosis not present

## 2019-12-31 DIAGNOSIS — M0589 Other rheumatoid arthritis with rheumatoid factor of multiple sites: Secondary | ICD-10-CM | POA: Diagnosis not present

## 2020-01-02 DIAGNOSIS — M5416 Radiculopathy, lumbar region: Secondary | ICD-10-CM | POA: Diagnosis not present

## 2020-01-02 DIAGNOSIS — M9903 Segmental and somatic dysfunction of lumbar region: Secondary | ICD-10-CM | POA: Diagnosis not present

## 2020-01-02 DIAGNOSIS — M62830 Muscle spasm of back: Secondary | ICD-10-CM | POA: Diagnosis not present

## 2020-01-02 DIAGNOSIS — M5136 Other intervertebral disc degeneration, lumbar region: Secondary | ICD-10-CM | POA: Diagnosis not present

## 2020-01-08 DIAGNOSIS — M62830 Muscle spasm of back: Secondary | ICD-10-CM | POA: Diagnosis not present

## 2020-01-08 DIAGNOSIS — M5416 Radiculopathy, lumbar region: Secondary | ICD-10-CM | POA: Diagnosis not present

## 2020-01-08 DIAGNOSIS — M5136 Other intervertebral disc degeneration, lumbar region: Secondary | ICD-10-CM | POA: Diagnosis not present

## 2020-01-08 DIAGNOSIS — M9903 Segmental and somatic dysfunction of lumbar region: Secondary | ICD-10-CM | POA: Diagnosis not present

## 2020-01-09 ENCOUNTER — Ambulatory Visit: Payer: PPO | Attending: Internal Medicine

## 2020-01-09 DIAGNOSIS — Z23 Encounter for immunization: Secondary | ICD-10-CM

## 2020-01-09 NOTE — Progress Notes (Signed)
   Covid-19 Vaccination Clinic  Name:  Julian Pineda    MRN: [id number] DOB: Feb 11, 1946  01/09/2020  [name] was observed post Covid-19 immunization for 15 minutes without incident. He was provided with Vaccine Information Sheet and instruction to access the V-Safe system.   [name] was instructed to call 911 with any severe reactions post vaccine: Marland Kitchen Difficulty breathing  . Swelling of face and throat  . A fast heartbeat  . A bad rash all over body  . Dizziness and weakness   Immunizations Administered    Name Date Dose VIS Date Route   Pfizer COVID-19 Vaccine 01/09/2020  9:26 AM 0.3 mL 10/12/2019 Intramuscular   Manufacturer: [place]   Lot: [id number]   [place]: [id number]

## 2020-01-10 ENCOUNTER — Other Ambulatory Visit: Payer: Self-pay

## 2020-01-14 ENCOUNTER — Other Ambulatory Visit: Payer: Self-pay

## 2020-01-14 ENCOUNTER — Telehealth (INDEPENDENT_AMBULATORY_CARE_PROVIDER_SITE_OTHER): Payer: PPO | Admitting: Family Medicine

## 2020-01-14 DIAGNOSIS — E7800 Pure hypercholesterolemia, unspecified: Secondary | ICD-10-CM | POA: Diagnosis not present

## 2020-01-14 DIAGNOSIS — R110 Nausea: Secondary | ICD-10-CM

## 2020-01-14 DIAGNOSIS — R5382 Chronic fatigue, unspecified: Secondary | ICD-10-CM

## 2020-01-14 DIAGNOSIS — I10 Essential (primary) hypertension: Secondary | ICD-10-CM

## 2020-01-14 DIAGNOSIS — E559 Vitamin D deficiency, unspecified: Secondary | ICD-10-CM

## 2020-01-14 NOTE — Assessment & Plan Note (Signed)
Resolved

## 2020-01-14 NOTE — Progress Notes (Signed)
Virtual Visit via video Note  This visit type was conducted due to national recommendations for restrictions regarding the COVID-19 pandemic (e.g. social distancing).  This format is felt to be most appropriate for this patient at this time.  All issues noted in this document were discussed and addressed.  No physical exam was performed (except for noted visual exam findings with Video Visits).   I connected with Julian Pineda today at 11:00 AM EDT by a video enabled telemedicine application and verified that I am speaking with the correct person using two identifiers. Location patient: home Location provider: home office Persons participating in the virtual visit: patient, provider, Jobanny Mavis (wife)  I discussed the limitations, risks, security and privacy concerns of performing an evaluation and management service by telephone and the availability of in person appointments. I also discussed with the patient that there may be a patient responsible charge related to this service. The patient expressed understanding and agreed to proceed.  Reason for visit: f/u  HPI: HYPERTENSION  Disease Monitoring  Home BP Monitoring not checking consistently. Chest pain-no.    Dyspnea-no. Medications  Compliance-taking losartan.  Edema-no.  Nausea: Patient notes this resolved.  He has been avoiding dairy products and that seems to make the biggest difference.  He is taking Pepcid once daily and is not sure that that makes any difference.  Elevated LDL: Noted on last lab work.  He exercises by going to the gym twice weekly and does play golf.  Diet is fairly healthy.  He does have bacon and sausage and eggs 1-2 times a week.  Typically has yogurt with blueberries for breakfast.  Not much lunch.  Typically has balanced dinners with some vegetables and typically lean protein.  Does not typically eat dessert.     ROS: See pertinent positives and negatives per HPI.  Past Medical History:    Diagnosis Date  . Diverticulosis of colon   . GERD (gastroesophageal reflux disease)    "occas"  . History of basal cell carcinoma (BCC) excision    09-25-2014  right lower eyelid s/p moh's dx /  left lower eyelid scheduled removal 07-05-2017  . History of squamous cell carcinoma excision    01/ 2015  left lateral cheek  s/p  moh's sx  . Hypertension    sees Dr. Jackalyn Lombard, [place]  . Nocturia   . OA (osteoarthritis)   . Prostate cancer [facility]) urologist-  dr eskidge/  oncologist- dr [name]   dx 04/ 2018-- Stage T1c, Gleason 3+3,  PSA 7.1,  vol 38.6cc  . Rheumatoid arteritis ([place])    rheumotologist-   dr Elpidio Galea  . Rosacea    Dr. Sharlett Iles  . Syncope 01/27/2018  . Vitamin D deficiency     Past Surgical History:  Procedure Laterality Date  . ACHILLES TENDON SURGERY Right 1984  . COLONOSCOPY  2008  . COLONOSCOPY WITH PROPOFOL N/A 11/09/2017   Procedure: COLONOSCOPY WITH PROPOFOL;  Surgeon: Manya Silvas, MD;  Location: [facility] ENDOSCOPY;  Service: Endoscopy;  Laterality: N/A;  . MOHS SURGERY  09-25-2014;  11-27-2013   [facility]   09-25-2014 right lower eyelid /  11-27-2013  left lateral cheek  . RADIOACTIVE SEED IMPLANT N/A 06/17/2017   Procedure: RADIOACTIVE SEED IMPLANT/BRACHYTHERAPY IMPLANT;  Surgeon: Festus Aloe, MD;  Location: [facility];  Service: Urology;  Laterality: N/A;  . TONSILLECTOMY  child  . TOTAL KNEE ARTHROPLASTY  12/04/2012   Procedure: TOTAL KNEE ARTHROPLASTY;  Surgeon: Vickey Huger, MD;  Location: [place];  Service: Orthopedics;  Laterality: Left;  left total knee arthroplasty  . VARICOSE VEIN SURGERY  2000   left leg    Family History  Problem Relation Age of Onset  . Cancer Mother        Multiple Myeloma  . Alcohol abuse Father   . Arthritis Father   . Prostate cancer Brother   . Breast cancer Daughter   . Hematuria Neg Hx   . Sickle cell anemia Neg Hx   . Kidney cancer Neg Hx   . Bladder Cancer Neg Hx     SOCIAL HX:  Non-smoker   Current Outpatient Medications:  .  cholecalciferol (VITAMIN D) 1000 units tablet, Take 1,000 Units by mouth every morning. , Disp: , Rfl:  .  famotidine (PEPCID) 10 MG tablet, Take 1 tablet (10 mg total) by mouth 2 (two) times daily., Disp: 60 tablet, Rfl: 2 .  folic acid (FOLVITE) 1 MG tablet, TAKE 1 TABLET EVERY DAY, Disp: 90 tablet, Rfl: 0 .  losartan (COZAAR) 100 MG tablet, TAKE 1 TABLET BY MOUTH DAILY, Disp: 90 tablet, Rfl: 1 .  meloxicam (MOBIC) 15 MG tablet, TAKE 1 TABLET BY MOUTH ONCE DAILY WITH FOOD, Disp: 30 tablet, Rfl: 2 .  methotrexate 50 MG/2ML injection, INJECT 1ML WEEKLY, Disp: 4 mL, Rfl: 6 .  metroNIDAZOLE (METROCREAM) 0.75 % cream, Apply 1 application topically daily. , Disp: , Rfl:   EXAM:  VITALS per patient if applicable:  GENERAL: alert, oriented, appears well and in no acute distress  HEENT: atraumatic, conjunttiva clear, no obvious abnormalities on inspection of external nose and ears  NECK: normal movements of the head and neck  LUNGS: on inspection no signs of respiratory distress, breathing rate appears normal, no obvious gross SOB, gasping or wheezing  CV: no obvious cyanosis  MS: moves all visible extremities without noticeable abnormality  PSYCH/NEURO: pleasant and cooperative, no obvious depression or anxiety, speech and thought processing grossly intact  ASSESSMENT AND PLAN:  Discussed the following assessment and plan:  Hypertension Undetermined control.  He will check his blood pressure and contact us with his readings.  He will continue his losartan.  Elevated LDL cholesterol level Noted on prior labs.  He will continue with diet and exercise.  Plan to recheck in 1 to 2 months.  Discussed the potential for medication if not at goal.  Fatigue Resolved.  Nausea Resolved.  Discussed he could try coming off of the Pepcid.  I suspect it was related to dietary issues.  Vitamin D deficiency disease Check with labs.   Orders  Placed This Encounter  Procedures  . LDL cholesterol, direct    Standing Status:   Future    Standing Expiration Date:   01/13/2021  . Comp Met (CMET)    Standing Status:   Future    Standing Expiration Date:   01/13/2021  . Vitamin D (25 hydroxy)    Standing Status:   Future    Standing Expiration Date:   01/13/2021    No orders of the defined types were placed in this encounter.    I discussed the assessment and treatment plan with the patient. The patient was provided an opportunity to ask questions and all were answered. The patient agreed with the plan and demonstrated an understanding of the instructions.   The patient was advised to call back or seek an in-person evaluation if the symptoms worsen or if the condition fails to improve as anticipated.   Randall Hiss  Caryl Bis, MD

## 2020-01-14 NOTE — Assessment & Plan Note (Signed)
Noted on prior labs.  He will continue with diet and exercise.  Plan to recheck in 1 to 2 months.  Discussed the potential for medication if not at goal.

## 2020-01-14 NOTE — Assessment & Plan Note (Signed)
Check with labs. ?

## 2020-01-14 NOTE — Assessment & Plan Note (Signed)
Resolved.  Discussed he could try coming off of the Pepcid.  I suspect it was related to dietary issues.

## 2020-01-14 NOTE — Assessment & Plan Note (Signed)
Undetermined control.  He will check his blood pressure and contact us with his readings.  He will continue his losartan.

## 2020-01-15 DIAGNOSIS — M5136 Other intervertebral disc degeneration, lumbar region: Secondary | ICD-10-CM | POA: Diagnosis not present

## 2020-01-15 DIAGNOSIS — M62830 Muscle spasm of back: Secondary | ICD-10-CM | POA: Diagnosis not present

## 2020-01-15 DIAGNOSIS — M9903 Segmental and somatic dysfunction of lumbar region: Secondary | ICD-10-CM | POA: Diagnosis not present

## 2020-01-15 DIAGNOSIS — M5416 Radiculopathy, lumbar region: Secondary | ICD-10-CM | POA: Diagnosis not present

## 2020-01-22 DIAGNOSIS — M5416 Radiculopathy, lumbar region: Secondary | ICD-10-CM | POA: Diagnosis not present

## 2020-01-22 DIAGNOSIS — M9903 Segmental and somatic dysfunction of lumbar region: Secondary | ICD-10-CM | POA: Diagnosis not present

## 2020-01-22 DIAGNOSIS — M62830 Muscle spasm of back: Secondary | ICD-10-CM | POA: Diagnosis not present

## 2020-01-22 DIAGNOSIS — M5136 Other intervertebral disc degeneration, lumbar region: Secondary | ICD-10-CM | POA: Diagnosis not present

## 2020-01-29 DIAGNOSIS — M5416 Radiculopathy, lumbar region: Secondary | ICD-10-CM | POA: Diagnosis not present

## 2020-01-29 DIAGNOSIS — M5136 Other intervertebral disc degeneration, lumbar region: Secondary | ICD-10-CM | POA: Diagnosis not present

## 2020-01-29 DIAGNOSIS — M62830 Muscle spasm of back: Secondary | ICD-10-CM | POA: Diagnosis not present

## 2020-01-29 DIAGNOSIS — M9903 Segmental and somatic dysfunction of lumbar region: Secondary | ICD-10-CM | POA: Diagnosis not present

## 2020-02-05 DIAGNOSIS — M5136 Other intervertebral disc degeneration, lumbar region: Secondary | ICD-10-CM | POA: Diagnosis not present

## 2020-02-05 DIAGNOSIS — M62830 Muscle spasm of back: Secondary | ICD-10-CM | POA: Diagnosis not present

## 2020-02-05 DIAGNOSIS — M9903 Segmental and somatic dysfunction of lumbar region: Secondary | ICD-10-CM | POA: Diagnosis not present

## 2020-02-05 DIAGNOSIS — M5416 Radiculopathy, lumbar region: Secondary | ICD-10-CM | POA: Diagnosis not present

## 2020-02-11 DIAGNOSIS — H2511 Age-related nuclear cataract, right eye: Secondary | ICD-10-CM | POA: Diagnosis not present

## 2020-02-12 DIAGNOSIS — M5416 Radiculopathy, lumbar region: Secondary | ICD-10-CM | POA: Diagnosis not present

## 2020-02-12 DIAGNOSIS — M62830 Muscle spasm of back: Secondary | ICD-10-CM | POA: Diagnosis not present

## 2020-02-12 DIAGNOSIS — M5136 Other intervertebral disc degeneration, lumbar region: Secondary | ICD-10-CM | POA: Diagnosis not present

## 2020-02-12 DIAGNOSIS — M9903 Segmental and somatic dysfunction of lumbar region: Secondary | ICD-10-CM | POA: Diagnosis not present

## 2020-02-19 ENCOUNTER — Telehealth: Payer: Self-pay | Admitting: Family Medicine

## 2020-02-19 DIAGNOSIS — M9903 Segmental and somatic dysfunction of lumbar region: Secondary | ICD-10-CM | POA: Diagnosis not present

## 2020-02-19 DIAGNOSIS — M62830 Muscle spasm of back: Secondary | ICD-10-CM | POA: Diagnosis not present

## 2020-02-19 DIAGNOSIS — M5136 Other intervertebral disc degeneration, lumbar region: Secondary | ICD-10-CM | POA: Diagnosis not present

## 2020-02-19 DIAGNOSIS — M5416 Radiculopathy, lumbar region: Secondary | ICD-10-CM | POA: Diagnosis not present

## 2020-02-19 NOTE — Telephone Encounter (Signed)
Lvm to set up lab and pcp follow up

## 2020-02-22 ENCOUNTER — Other Ambulatory Visit: Payer: Self-pay

## 2020-02-22 ENCOUNTER — Ambulatory Visit
Admission: EM | Admit: 2020-02-22 | Discharge: 2020-02-22 | Disposition: A | Discharge status: Home / Self Care | Payer: PPO | Attending: Emergency Medicine | Admitting: Emergency Medicine

## 2020-02-22 ENCOUNTER — Telehealth: Payer: Self-pay | Admitting: Family Medicine

## 2020-02-22 ENCOUNTER — Other Ambulatory Visit (INDEPENDENT_AMBULATORY_CARE_PROVIDER_SITE_OTHER): Payer: PPO

## 2020-02-22 DIAGNOSIS — L0291 Cutaneous abscess, unspecified: Secondary | ICD-10-CM

## 2020-02-22 DIAGNOSIS — I10 Essential (primary) hypertension: Secondary | ICD-10-CM

## 2020-02-22 DIAGNOSIS — E7800 Pure hypercholesterolemia, unspecified: Secondary | ICD-10-CM | POA: Diagnosis not present

## 2020-02-22 DIAGNOSIS — E559 Vitamin D deficiency, unspecified: Secondary | ICD-10-CM | POA: Diagnosis not present

## 2020-02-22 LAB — COMPREHENSIVE METABOLIC PANEL
ALT: 17 U/L (ref 0–53)
AST: 20 U/L (ref 0–37)
Albumin: 4 g/dL (ref 3.5–5.2)
Alkaline Phosphatase: 45 U/L (ref 39–117)
BUN: 19 mg/dL (ref 6–23)
CO2: 29 mEq/L (ref 19–32)
Calcium: 9.3 mg/dL (ref 8.4–10.5)
Chloride: 105 mEq/L (ref 96–112)
Creatinine, Ser: 1.06 mg/dL (ref 0.40–1.50)
GFR: 68.66 mL/min (ref >60.00)
Glucose, Bld: 115 mg/dL — ABNORMAL HIGH (ref 70–99)
Potassium: 4.1 mEq/L (ref 3.5–5.1)
Sodium: 140 mEq/L (ref 135–145)
Total Bilirubin: 0.6 mg/dL (ref 0.2–1.2)
Total Protein: 6.2 g/dL (ref 6.0–8.3)

## 2020-02-22 LAB — LDL CHOLESTEROL, DIRECT: Direct LDL: 78 mg/dL

## 2020-02-22 LAB — VITAMIN D 25 HYDROXY (VIT D DEFICIENCY, FRACTURES): VITD: 28.07 ng/mL — ABNORMAL LOW (ref 30.00–100.00)

## 2020-02-22 MED ORDER — DOXYCYCLINE HYCLATE 100 MG PO CAPS: 100.0000 mg | ORAL_CAPSULE | Freq: Two times a day (BID) | ORAL | 0 refills | Status: DC

## 2020-02-22 NOTE — Discharge Instructions (Signed)
Keep your wound clean and dry.  Wash it gently twice a day with soap and water.  Apply the antibiotic cream twice a day.    Take the antibiotic as directed.    Follow up with your dermatologist as scheduled next week.   Return here if you see signs of worsening infection, such as increased pain, redness, red streaks, warmth, fever, chills, or other concerning symptoms.

## 2020-02-22 NOTE — ED Triage Notes (Signed)
Patient complains of an abscess on his left side x 10 days. States that area has been draining and has an appt with Dermatology next week but is concerned he may need abx sooner.

## 2020-02-22 NOTE — ED Provider Notes (Signed)
Julian Pineda    CSN: [id number] Arrival date & time: 02/22/20  1012      History   Chief Complaint Chief Complaint  Patient presents with  . Abscess    HPI Julian Pineda is a 74 y.o. male.   Patient presents with an abscess on his left flank x 10 days.  He reports it has been there for years and that it flares up and he has to remove purulent drainage and then it will resolve for a while.  The latest flareup has not resolved but has been draining and is red surrounding the lesion.  He has an appointment with his dermatologist next week for this.  He denies fever or chills.  He has been applying Vaseline to the area.    The history is provided by the patient.    Past Medical History:  Diagnosis Date  . Diverticulosis of colon   . GERD (gastroesophageal reflux disease)    "occas"  . History of basal cell carcinoma (BCC) excision    09-25-2014  right lower eyelid s/p moh's dx /  left lower eyelid scheduled removal 07-05-2017  . History of squamous cell carcinoma excision    01/ 2015  left lateral cheek  s/p  moh's sx  . Hypertension    sees Dr. Jackalyn Lombard,    . Nocturia   . OA (osteoarthritis)   . Prostate cancer [facility] urologist-  dr eskidge/  oncologist- dr [name]   dx 04/ 2018-- Stage T1c, Gleason 3+3,  PSA 7.1,  vol 38.6cc  . Rheumatoid arteritis ([place])    rheumotologist-   dr Elpidio Galea  . Rosacea    Dr. Sharlett Iles  . Syncope 01/27/2018  . Vitamin D deficiency     Patient Active Problem List   Diagnosis Date Noted  . Elevated LDL cholesterol level 01/14/2020  . Alcohol use 11/16/2019  . Nausea 03/21/2019  . Physical deconditioning 05/15/2018  . Basal cell carcinoma 07/28/2017  . Low back pain 01/25/2017  . History of prostate cancer 08/24/2016  . Personal history of other malignant neoplasm of skin 11/20/2013  . S/P TKR (total knee replacement) 01/16/2013  . Rheumatoid arthritis ([place]) 02/16/2012  . Vitamin D deficiency disease 02/16/2012    . Hypertension 02/16/2012    Past Surgical History:  Procedure Laterality Date  . ACHILLES TENDON SURGERY Right 1984  . COLONOSCOPY  2008  . COLONOSCOPY WITH PROPOFOL N/A 11/09/2017   Procedure: COLONOSCOPY WITH PROPOFOL;  Surgeon: Manya Silvas, MD;  Location: [facility] ENDOSCOPY;  Service: Endoscopy;  Laterality: N/A;  . MOHS SURGERY  09-25-2014;  11-27-2013   [place]   09-25-2014 right lower eyelid /  11-27-2013  left lateral cheek  . RADIOACTIVE SEED IMPLANT N/A 06/17/2017   Procedure: RADIOACTIVE SEED IMPLANT/BRACHYTHERAPY IMPLANT;  Surgeon: Festus Aloe, MD;  Location: [facility];  Service: Urology;  Laterality: N/A;  . TONSILLECTOMY  child  . TOTAL KNEE ARTHROPLASTY  12/04/2012   Procedure: TOTAL KNEE ARTHROPLASTY;  Surgeon: Vickey Huger, MD;  Location: [place];  Service: Orthopedics;  Laterality: Left;  left total knee arthroplasty  . VARICOSE VEIN SURGERY  2000   left leg       Home Medications    Prior to Admission medications   Medication Sig Start Date End Date Taking? Authorizing Provider  cholecalciferol (VITAMIN D) 1000 units tablet Take 1,000 Units by mouth every morning.    Yes [provider]  folic acid (FOLVITE) 1 MG tablet TAKE 1 TABLET  EVERY DAY 09/15/15  Yes Jackolyn Confer, MD  losartan (COZAAR) 100 MG tablet TAKE 1 TABLET BY MOUTH DAILY 12/20/19  Yes Leone Haven, MD  meloxicam (MOBIC) 15 MG tablet TAKE 1 TABLET BY MOUTH ONCE DAILY WITH FOOD 10/07/16  Yes Leone Haven, MD  methotrexate 50 MG/2ML injection INJECT 1ML WEEKLY 04/15/16  Yes Jackolyn Confer, MD  metroNIDAZOLE (METROCREAM) 0.75 % cream Apply 1 application topically daily.    Yes [provider]  doxycycline (VIBRAMYCIN) 100 MG capsule Take 1 capsule (100 mg total) by mouth 2 (two) times daily. 02/22/20   Sharion Balloon, NP  famotidine (PEPCID) 10 MG tablet Take 1 tablet (10 mg total) by mouth 2 (two) times daily. 12/17/19   Leone Haven, MD     Family History Family History  Problem Relation Age of Onset  . Cancer Mother        Multiple Myeloma  . Alcohol abuse Father   . Arthritis Father   . Prostate cancer Brother   . Breast cancer Daughter   . Hematuria Neg Hx   . Sickle cell anemia Neg Hx   . Kidney cancer Neg Hx   . Bladder Cancer Neg Hx     Social History Social History   Tobacco Use  . Smoking status: Never Smoker  . Smokeless tobacco: Former Systems developer    Types: Chew  Substance Use Topics  . Alcohol use: Yes    Alcohol/week: 14.0 standard drinks    Types: 14 Shots of liquor per week    Comment: 2 per day  . Drug use: No     Allergies   Penicillins   Review of Systems Review of Systems  Constitutional: Negative for chills and fever.  HENT: Negative for ear pain and sore throat.   Eyes: Negative for pain and visual disturbance.  Respiratory: Negative for cough and shortness of breath.   Cardiovascular: Negative for chest pain and palpitations.  Gastrointestinal: Negative for abdominal pain and vomiting.  Genitourinary: Negative for dysuria and hematuria.  Musculoskeletal: Negative for arthralgias and back pain.  Skin: Positive for wound. Negative for color change and rash.  Neurological: Negative for seizures and syncope.  All other systems reviewed and are negative.    Physical Exam Triage Vital Signs ED Triage Vitals  Enc Vitals Group     BP      Pulse      Resp      Temp      Temp src      SpO2      Weight      Height      Head Circumference      Peak Flow      Pain Score      Pain Loc      Pain Edu?      Excl. in [place]?    No data found.  Updated Vital Signs BP (!) 143/82 (BP Location: Left Arm)   Pulse 69   Temp 98.4 F (36.9 C) (Oral)   Resp 18   Ht 5' 10.5" (1.791 m)   Wt 192 lb (87.1 kg)   SpO2 99%   BMI 27.16 kg/m   Visual Acuity Right Eye Distance:   Left Eye Distance:   Bilateral Distance:    Right Eye Near:   Left Eye Near:    Bilateral Near:      Physical Exam Vitals and nursing note reviewed.  Constitutional:      Appearance: He  is well-developed.  HENT:     Head: Normocephalic and atraumatic.     Mouth/Throat:     Mouth: Mucous membranes are moist.  Eyes:     Conjunctiva/sclera: Conjunctivae normal.  Cardiovascular:     Rate and Rhythm: Normal rate and regular rhythm.     Heart sounds: No murmur.  Pulmonary:     Effort: Pulmonary effort is normal. No respiratory distress.     Breath sounds: Normal breath sounds.  Abdominal:     Palpations: Abdomen is soft.     Tenderness: There is no abdominal tenderness. There is no guarding or rebound.  Musculoskeletal:     Cervical back: Neck supple.  Skin:    General: Skin is warm and dry.     Findings: Lesion present.     Comments: 5 cm area of erythema with central open ulcer.  Neurological:     General: No focal deficit present.     Mental Status: He is alert and oriented to person, place, and time.     Gait: Gait normal.  Psychiatric:        Mood and Affect: Mood normal.        Behavior: Behavior normal.      UC Treatments / Results  Labs (all labs ordered are listed, but only abnormal results are displayed) Labs Reviewed - No data to display  EKG   Radiology No results found.  Procedures Procedures (including critical care time)  Medications Ordered in UC Medications - No data to display  Initial Impression / Assessment and Plan / UC Course  I have reviewed the triage vital signs and the nursing notes.  Pertinent labs & imaging results that were available during my care of the patient were reviewed by me and considered in my medical decision making (see chart for details).   Abscess on left flank.  Treating with doxycycline.  Wound care instructions and signs of increased infection discussed with patient.  Instructed him to follow-up with his dermatologist as scheduled next week.  Instructed him to see his PCP or return here if he notes signs of  worsening infection prior to his dermatology appointment.  Patient agrees to plan of care.     Final Clinical Impressions(s) / UC Diagnoses   Final diagnoses:  Abscess     Discharge Instructions     Keep your wound clean and dry.  Wash it gently twice a day with soap and water.  Apply the antibiotic cream twice a day.    Take the antibiotic as directed.    Follow up with your dermatologist as scheduled next week.   Return here if you see signs of worsening infection, such as increased pain, redness, red streaks, warmth, fever, chills, or other concerning symptoms.         ED Prescriptions    Medication Sig Dispense Auth. Provider   doxycycline (VIBRAMYCIN) 100 MG capsule Take 1 capsule (100 mg total) by mouth 2 (two) times daily. 20 capsule Sharion Balloon, NP     PDMP not reviewed this encounter.   Sharion Balloon, NP 02/22/20 1039

## 2020-02-22 NOTE — Telephone Encounter (Signed)
Pt has a boil-possible staph infection on left side under armpit. Pt decided to go to [facility] urgent Care. FYI

## 2020-02-26 DIAGNOSIS — M62830 Muscle spasm of back: Secondary | ICD-10-CM | POA: Diagnosis not present

## 2020-02-26 DIAGNOSIS — M9903 Segmental and somatic dysfunction of lumbar region: Secondary | ICD-10-CM | POA: Diagnosis not present

## 2020-02-26 DIAGNOSIS — M5416 Radiculopathy, lumbar region: Secondary | ICD-10-CM | POA: Diagnosis not present

## 2020-02-26 DIAGNOSIS — M5136 Other intervertebral disc degeneration, lumbar region: Secondary | ICD-10-CM | POA: Diagnosis not present

## 2020-02-27 DIAGNOSIS — L728 Other follicular cysts of the skin and subcutaneous tissue: Secondary | ICD-10-CM | POA: Diagnosis not present

## 2020-02-27 DIAGNOSIS — L570 Actinic keratosis: Secondary | ICD-10-CM | POA: Diagnosis not present

## 2020-03-04 DIAGNOSIS — M5136 Other intervertebral disc degeneration, lumbar region: Secondary | ICD-10-CM | POA: Diagnosis not present

## 2020-03-04 DIAGNOSIS — M62830 Muscle spasm of back: Secondary | ICD-10-CM | POA: Diagnosis not present

## 2020-03-04 DIAGNOSIS — M5416 Radiculopathy, lumbar region: Secondary | ICD-10-CM | POA: Diagnosis not present

## 2020-03-04 DIAGNOSIS — M9903 Segmental and somatic dysfunction of lumbar region: Secondary | ICD-10-CM | POA: Diagnosis not present

## 2020-03-11 DIAGNOSIS — M9903 Segmental and somatic dysfunction of lumbar region: Secondary | ICD-10-CM | POA: Diagnosis not present

## 2020-03-11 DIAGNOSIS — M62830 Muscle spasm of back: Secondary | ICD-10-CM | POA: Diagnosis not present

## 2020-03-11 DIAGNOSIS — M5136 Other intervertebral disc degeneration, lumbar region: Secondary | ICD-10-CM | POA: Diagnosis not present

## 2020-03-11 DIAGNOSIS — M5416 Radiculopathy, lumbar region: Secondary | ICD-10-CM | POA: Diagnosis not present

## 2020-03-18 DIAGNOSIS — M9903 Segmental and somatic dysfunction of lumbar region: Secondary | ICD-10-CM | POA: Diagnosis not present

## 2020-03-18 DIAGNOSIS — M5416 Radiculopathy, lumbar region: Secondary | ICD-10-CM | POA: Diagnosis not present

## 2020-03-18 DIAGNOSIS — M62830 Muscle spasm of back: Secondary | ICD-10-CM | POA: Diagnosis not present

## 2020-03-18 DIAGNOSIS — M5136 Other intervertebral disc degeneration, lumbar region: Secondary | ICD-10-CM | POA: Diagnosis not present

## 2020-03-25 DIAGNOSIS — X32XXXA Exposure to sunlight, initial encounter: Secondary | ICD-10-CM | POA: Diagnosis not present

## 2020-03-25 DIAGNOSIS — B353 Tinea pedis: Secondary | ICD-10-CM | POA: Diagnosis not present

## 2020-03-25 DIAGNOSIS — M9903 Segmental and somatic dysfunction of lumbar region: Secondary | ICD-10-CM | POA: Diagnosis not present

## 2020-03-25 DIAGNOSIS — M5416 Radiculopathy, lumbar region: Secondary | ICD-10-CM | POA: Diagnosis not present

## 2020-03-25 DIAGNOSIS — L570 Actinic keratosis: Secondary | ICD-10-CM | POA: Diagnosis not present

## 2020-03-25 DIAGNOSIS — C44329 Squamous cell carcinoma of skin of other parts of face: Secondary | ICD-10-CM | POA: Diagnosis not present

## 2020-03-25 DIAGNOSIS — D2262 Melanocytic nevi of left upper limb, including shoulder: Secondary | ICD-10-CM | POA: Diagnosis not present

## 2020-03-25 DIAGNOSIS — D2271 Melanocytic nevi of right lower limb, including hip: Secondary | ICD-10-CM | POA: Diagnosis not present

## 2020-03-25 DIAGNOSIS — M62830 Muscle spasm of back: Secondary | ICD-10-CM | POA: Diagnosis not present

## 2020-03-25 DIAGNOSIS — Z85828 Personal history of other malignant neoplasm of skin: Secondary | ICD-10-CM | POA: Diagnosis not present

## 2020-03-25 DIAGNOSIS — D485 Neoplasm of uncertain behavior of skin: Secondary | ICD-10-CM | POA: Diagnosis not present

## 2020-03-25 DIAGNOSIS — D2261 Melanocytic nevi of right upper limb, including shoulder: Secondary | ICD-10-CM | POA: Diagnosis not present

## 2020-03-25 DIAGNOSIS — D225 Melanocytic nevi of trunk: Secondary | ICD-10-CM | POA: Diagnosis not present

## 2020-03-25 DIAGNOSIS — M5136 Other intervertebral disc degeneration, lumbar region: Secondary | ICD-10-CM | POA: Diagnosis not present

## 2020-04-01 DIAGNOSIS — R5382 Chronic fatigue, unspecified: Secondary | ICD-10-CM | POA: Diagnosis not present

## 2020-04-01 DIAGNOSIS — Z6827 Body mass index (BMI) 27.0-27.9, adult: Secondary | ICD-10-CM | POA: Diagnosis not present

## 2020-04-01 DIAGNOSIS — M19049 Primary osteoarthritis, unspecified hand: Secondary | ICD-10-CM | POA: Diagnosis not present

## 2020-04-01 DIAGNOSIS — Z79899 Other long term (current) drug therapy: Secondary | ICD-10-CM | POA: Diagnosis not present

## 2020-04-01 DIAGNOSIS — M0589 Other rheumatoid arthritis with rheumatoid factor of multiple sites: Secondary | ICD-10-CM | POA: Diagnosis not present

## 2020-04-01 DIAGNOSIS — E663 Overweight: Secondary | ICD-10-CM | POA: Diagnosis not present

## 2020-04-02 DIAGNOSIS — M5416 Radiculopathy, lumbar region: Secondary | ICD-10-CM | POA: Diagnosis not present

## 2020-04-02 DIAGNOSIS — M9903 Segmental and somatic dysfunction of lumbar region: Secondary | ICD-10-CM | POA: Diagnosis not present

## 2020-04-02 DIAGNOSIS — M5136 Other intervertebral disc degeneration, lumbar region: Secondary | ICD-10-CM | POA: Diagnosis not present

## 2020-04-02 DIAGNOSIS — M62830 Muscle spasm of back: Secondary | ICD-10-CM | POA: Diagnosis not present

## 2020-04-08 DIAGNOSIS — M5136 Other intervertebral disc degeneration, lumbar region: Secondary | ICD-10-CM | POA: Diagnosis not present

## 2020-04-08 DIAGNOSIS — M5416 Radiculopathy, lumbar region: Secondary | ICD-10-CM | POA: Diagnosis not present

## 2020-04-08 DIAGNOSIS — M9903 Segmental and somatic dysfunction of lumbar region: Secondary | ICD-10-CM | POA: Diagnosis not present

## 2020-04-08 DIAGNOSIS — M62830 Muscle spasm of back: Secondary | ICD-10-CM | POA: Diagnosis not present

## 2020-04-10 DIAGNOSIS — C44329 Squamous cell carcinoma of skin of other parts of face: Secondary | ICD-10-CM | POA: Diagnosis not present

## 2020-04-10 DIAGNOSIS — L570 Actinic keratosis: Secondary | ICD-10-CM | POA: Diagnosis not present

## 2020-04-15 DIAGNOSIS — M5136 Other intervertebral disc degeneration, lumbar region: Secondary | ICD-10-CM | POA: Diagnosis not present

## 2020-04-15 DIAGNOSIS — M62830 Muscle spasm of back: Secondary | ICD-10-CM | POA: Diagnosis not present

## 2020-04-15 DIAGNOSIS — M9903 Segmental and somatic dysfunction of lumbar region: Secondary | ICD-10-CM | POA: Diagnosis not present

## 2020-04-15 DIAGNOSIS — M5416 Radiculopathy, lumbar region: Secondary | ICD-10-CM | POA: Diagnosis not present

## 2020-04-22 DIAGNOSIS — M5416 Radiculopathy, lumbar region: Secondary | ICD-10-CM | POA: Diagnosis not present

## 2020-04-22 DIAGNOSIS — M62830 Muscle spasm of back: Secondary | ICD-10-CM | POA: Diagnosis not present

## 2020-04-22 DIAGNOSIS — M5136 Other intervertebral disc degeneration, lumbar region: Secondary | ICD-10-CM | POA: Diagnosis not present

## 2020-04-22 DIAGNOSIS — M9903 Segmental and somatic dysfunction of lumbar region: Secondary | ICD-10-CM | POA: Diagnosis not present

## 2020-04-29 DIAGNOSIS — M9903 Segmental and somatic dysfunction of lumbar region: Secondary | ICD-10-CM | POA: Diagnosis not present

## 2020-04-29 DIAGNOSIS — M5136 Other intervertebral disc degeneration, lumbar region: Secondary | ICD-10-CM | POA: Diagnosis not present

## 2020-04-29 DIAGNOSIS — M62830 Muscle spasm of back: Secondary | ICD-10-CM | POA: Diagnosis not present

## 2020-04-29 DIAGNOSIS — M5416 Radiculopathy, lumbar region: Secondary | ICD-10-CM | POA: Diagnosis not present

## 2020-05-06 DIAGNOSIS — M5136 Other intervertebral disc degeneration, lumbar region: Secondary | ICD-10-CM | POA: Diagnosis not present

## 2020-05-06 DIAGNOSIS — M5416 Radiculopathy, lumbar region: Secondary | ICD-10-CM | POA: Diagnosis not present

## 2020-05-06 DIAGNOSIS — M62830 Muscle spasm of back: Secondary | ICD-10-CM | POA: Diagnosis not present

## 2020-05-06 DIAGNOSIS — M9903 Segmental and somatic dysfunction of lumbar region: Secondary | ICD-10-CM | POA: Diagnosis not present

## 2020-05-13 DIAGNOSIS — M5136 Other intervertebral disc degeneration, lumbar region: Secondary | ICD-10-CM | POA: Diagnosis not present

## 2020-05-13 DIAGNOSIS — M5416 Radiculopathy, lumbar region: Secondary | ICD-10-CM | POA: Diagnosis not present

## 2020-05-13 DIAGNOSIS — M62830 Muscle spasm of back: Secondary | ICD-10-CM | POA: Diagnosis not present

## 2020-05-13 DIAGNOSIS — M9903 Segmental and somatic dysfunction of lumbar region: Secondary | ICD-10-CM | POA: Diagnosis not present

## 2020-05-20 DIAGNOSIS — M9903 Segmental and somatic dysfunction of lumbar region: Secondary | ICD-10-CM | POA: Diagnosis not present

## 2020-05-20 DIAGNOSIS — M62830 Muscle spasm of back: Secondary | ICD-10-CM | POA: Diagnosis not present

## 2020-05-20 DIAGNOSIS — M5416 Radiculopathy, lumbar region: Secondary | ICD-10-CM | POA: Diagnosis not present

## 2020-05-20 DIAGNOSIS — M5136 Other intervertebral disc degeneration, lumbar region: Secondary | ICD-10-CM | POA: Diagnosis not present

## 2020-05-23 ENCOUNTER — Ambulatory Visit: Payer: PPO | Admitting: Family Medicine

## 2020-05-27 DIAGNOSIS — M5136 Other intervertebral disc degeneration, lumbar region: Secondary | ICD-10-CM | POA: Diagnosis not present

## 2020-05-27 DIAGNOSIS — M9903 Segmental and somatic dysfunction of lumbar region: Secondary | ICD-10-CM | POA: Diagnosis not present

## 2020-05-27 DIAGNOSIS — M5416 Radiculopathy, lumbar region: Secondary | ICD-10-CM | POA: Diagnosis not present

## 2020-05-27 DIAGNOSIS — M62830 Muscle spasm of back: Secondary | ICD-10-CM | POA: Diagnosis not present

## 2020-06-10 DIAGNOSIS — M9903 Segmental and somatic dysfunction of lumbar region: Secondary | ICD-10-CM | POA: Diagnosis not present

## 2020-06-10 DIAGNOSIS — M62830 Muscle spasm of back: Secondary | ICD-10-CM | POA: Diagnosis not present

## 2020-06-10 DIAGNOSIS — M5136 Other intervertebral disc degeneration, lumbar region: Secondary | ICD-10-CM | POA: Diagnosis not present

## 2020-06-10 DIAGNOSIS — M5416 Radiculopathy, lumbar region: Secondary | ICD-10-CM | POA: Diagnosis not present

## 2020-06-20 ENCOUNTER — Other Ambulatory Visit: Payer: Self-pay | Admitting: Family Medicine

## 2020-06-23 ENCOUNTER — Ambulatory Visit (INDEPENDENT_AMBULATORY_CARE_PROVIDER_SITE_OTHER): Payer: PPO | Admitting: Family Medicine

## 2020-06-23 ENCOUNTER — Encounter: Payer: Self-pay | Admitting: Family Medicine

## 2020-06-23 ENCOUNTER — Other Ambulatory Visit: Payer: Self-pay

## 2020-06-23 VITALS — BP 156/82 | HR 86 | Temp 98.6°F | Ht 70.51 in | Wt 197.8 lb

## 2020-06-23 DIAGNOSIS — R5382 Chronic fatigue, unspecified: Secondary | ICD-10-CM

## 2020-06-23 DIAGNOSIS — Z8546 Personal history of malignant neoplasm of prostate: Secondary | ICD-10-CM

## 2020-06-23 DIAGNOSIS — R110 Nausea: Secondary | ICD-10-CM | POA: Diagnosis not present

## 2020-06-23 DIAGNOSIS — I10 Essential (primary) hypertension: Secondary | ICD-10-CM | POA: Diagnosis not present

## 2020-06-23 MED ORDER — AMLODIPINE BESYLATE 5 MG PO TABS: 5.0000 mg | ORAL_TABLET | Freq: Every day | ORAL | 3 refills | Status: DC

## 2020-06-23 NOTE — Assessment & Plan Note (Signed)
Check PSA. He sees urology Wednesday.

## 2020-06-23 NOTE — Assessment & Plan Note (Signed)
Could be related to dietary issues.  Recheck labs and have our pharmacist review his medication list.  He can continue the Lactaid pills for his lactose intolerance.  Consider further evaluation once labs return.

## 2020-06-23 NOTE — Assessment & Plan Note (Addendum)
Above goal.  Add amlodipine.  He will continue losartan.  Check CMP.  Follow-up in 1 month for recheck.

## 2020-06-23 NOTE — Progress Notes (Signed)
 , MD Phone: [phone]  Julian Pineda is a 74 y.o. male who presents today for f/u.  HYPERTENSION  Disease Monitoring  Home BP Monitoring 140s systolic Chest pain- no    Dyspnea- no Medications  Compliance-  Taking losartan.  Edema- no  Chronic fatigue: Patient continues to have issues with this.  He notes he just does not feel like doing much of anything.  He wonders if it is related to the mental aspect or if it is a physical aspect.  He notes no chest pain or shortness of breath.  Once he makes his mind up to do something he is able to do it.  No depression.  Some anxiety particularly when he has the nauseous feeling.  Notes the nauseous feeling and the anxiety seem to improve after a bowel movement.  He has figured out he is lactose intolerant and notes taking Lactaid pills is made a difference.  He notes no abdominal pain.  No vomiting.  Occasional diarrhea though no blood in his stool.    Social History   Tobacco Use  Smoking Status Never Smoker  Smokeless Tobacco Former User  . Types: Chew     ROS see history of present illness  Objective  Physical Exam Vitals:   06/23/20 1442  BP: (!) 156/82  Pulse: 86  Temp: 98.6 F (37 C)  SpO2: 94%    BP Readings from Last 3 Encounters:  06/23/20 (!) 156/82  02/22/20 (!) 143/82  09/26/19 120/80   Wt Readings from Last 3 Encounters:  06/23/20 197 lb 12.8 oz (89.7 kg)  02/22/20 192 lb (87.1 kg)  01/14/20 193 lb (87.5 kg)    Physical Exam Constitutional:      General: He is not in acute distress.    Appearance: He is not diaphoretic.  Cardiovascular:     Rate and Rhythm: Normal rate and regular rhythm.     Heart sounds: Normal heart sounds.  Pulmonary:     Effort: Pulmonary effort is normal.     Breath sounds: Normal breath sounds.  Abdominal:     General: Bowel sounds are normal. There is no distension.     Palpations: Abdomen is soft.     Tenderness: There is no abdominal tenderness. There  is no guarding or rebound.  Musculoskeletal:     Right lower leg: No edema.     Left lower leg: No edema.  Skin:    General: Skin is warm and dry.  Neurological:     Mental Status: He is alert.      Assessment/Plan: Please see individual problem list.  Hypertension Above goal.  Add amlodipine.  He will continue losartan.  Check CMP.  Follow-up in 1 month for recheck.  Chronic fatigue Chronic issue.  Continues to bother him.  Difficult to say if this would be related to his nauseous feeling.  We will check lab work to evaluate for potential underlying causes.  I will have our pharmacist review his medicines to see if any of those could be contributing.  Nausea Could be related to dietary issues.  Recheck labs and have our pharmacist review his medication list.  He can continue the Lactaid pills for his lactose intolerance.  Consider further evaluation once labs return.  History of prostate cancer Check PSA. He sees urology Wednesday.    Orders Placed This Encounter  Procedures  . Comp Met (CMET)  . CBC w/Diff  . TSH  . B12  . Vitamin D (25 hydroxy)  .   Testosterone  . PSA    Meds ordered this encounter  Medications  . amLODipine (NORVASC) 5 MG tablet    Sig: Take 1 tablet (5 mg total) by mouth daily.    Dispense:  90 tablet    Refill:  3    This visit occurred during the SARS-CoV-2 public health emergency.  Safety protocols were in place, including screening questions prior to the visit, additional usage of staff PPE, and extensive cleaning of exam room while observing appropriate contact time as indicated for disinfecting solutions.     , MD [facility] [place]  

## 2020-06-23 NOTE — Assessment & Plan Note (Signed)
Chronic issue.  Continues to bother him.  Difficult to say if this would be related to his nauseous feeling.  We will check lab work to evaluate for potential underlying causes.  I will have our pharmacist review his medicines to see if any of those could be contributing.

## 2020-06-23 NOTE — Patient Instructions (Signed)
Nice to see you. We will contact you when we hear back from the pharmacist. We will also contact you with your lab results. Please start the amlodipine for your blood pressure.  You will continue the losartan as well.

## 2020-06-24 ENCOUNTER — Telehealth: Payer: Self-pay | Admitting: Family Medicine

## 2020-06-24 ENCOUNTER — Other Ambulatory Visit: Payer: Self-pay | Admitting: Family Medicine

## 2020-06-24 DIAGNOSIS — M5136 Other intervertebral disc degeneration, lumbar region: Secondary | ICD-10-CM | POA: Diagnosis not present

## 2020-06-24 DIAGNOSIS — M5416 Radiculopathy, lumbar region: Secondary | ICD-10-CM | POA: Diagnosis not present

## 2020-06-24 DIAGNOSIS — R718 Other abnormality of red blood cells: Secondary | ICD-10-CM

## 2020-06-24 DIAGNOSIS — M9903 Segmental and somatic dysfunction of lumbar region: Secondary | ICD-10-CM | POA: Diagnosis not present

## 2020-06-24 DIAGNOSIS — M62830 Muscle spasm of back: Secondary | ICD-10-CM | POA: Diagnosis not present

## 2020-06-24 LAB — VITAMIN D 25 HYDROXY (VIT D DEFICIENCY, FRACTURES): VITD: 26.41 ng/mL — ABNORMAL LOW (ref 30.00–100.00)

## 2020-06-24 LAB — CBC WITH DIFFERENTIAL/PLATELET
Basophils Absolute: 0.1 10*3/uL (ref 0.0–0.1)
Basophils Relative: 0.8 % (ref 0.0–3.0)
Eosinophils Absolute: 0.1 10*3/uL (ref 0.0–0.7)
Eosinophils Relative: 1.6 % (ref 0.0–5.0)
HCT: 42.6 % (ref 39.0–52.0)
Hemoglobin: 14.3 g/dL (ref 13.0–17.0)
Lymphocytes Relative: 13.2 % (ref 12.0–46.0)
Lymphs Abs: 1 10*3/uL (ref 0.7–4.0)
MCHC: 33.6 g/dL (ref 30.0–36.0)
MCV: 100.1 fl — ABNORMAL HIGH (ref 78.0–100.0)
Monocytes Absolute: 0.3 10*3/uL (ref 0.1–1.0)
Monocytes Relative: 4.7 % (ref 3.0–12.0)
Neutro Abs: 5.8 10*3/uL (ref 1.4–7.7)
Neutrophils Relative %: 79.7 % — ABNORMAL HIGH (ref 43.0–77.0)
Platelets: 269 10*3/uL (ref 150.0–400.0)
RBC: 4.25 Mil/uL (ref 4.22–5.81)
RDW: 13.7 % (ref 11.5–15.5)
WBC: 7.3 10*3/uL (ref 4.0–10.5)

## 2020-06-24 LAB — COMPREHENSIVE METABOLIC PANEL
ALT: 16 U/L (ref 0–53)
AST: 18 U/L (ref 0–37)
Albumin: 4.1 g/dL (ref 3.5–5.2)
Alkaline Phosphatase: 44 U/L (ref 39–117)
BUN: 26 mg/dL — ABNORMAL HIGH (ref 6–23)
CO2: 25 mEq/L (ref 19–32)
Calcium: 9.2 mg/dL (ref 8.4–10.5)
Chloride: 103 mEq/L (ref 96–112)
Creatinine, Ser: 1.11 mg/dL (ref 0.40–1.50)
GFR: 65.04 mL/min (ref >60.00)
Glucose, Bld: 153 mg/dL — ABNORMAL HIGH (ref 70–99)
Potassium: 3.8 mEq/L (ref 3.5–5.1)
Sodium: 136 mEq/L (ref 135–145)
Total Bilirubin: 0.6 mg/dL (ref 0.2–1.2)
Total Protein: 6.1 g/dL (ref 6.0–8.3)

## 2020-06-24 LAB — PSA: PSA: 0.17 ng/mL (ref 0.10–4.00)

## 2020-06-24 LAB — VITAMIN B12: Vitamin B-12: 392 pg/mL (ref 211–911)

## 2020-06-24 LAB — TESTOSTERONE: Testosterone: 293.95 ng/dL — ABNORMAL LOW (ref 300.00–890.00)

## 2020-06-24 LAB — TSH: TSH: 4.3 u[IU]/mL (ref 0.35–4.50)

## 2020-06-24 NOTE — Telephone Encounter (Signed)
Please let the patient know that the pharmacist felt that methotrexate could potentially be contributing to his symptoms of fatigue.  I would suggest that he discuss the fatigue with his rheumatologist to get their thoughts on it and whether or not the methotrexate could be contributing from their perspective.  Thanks.

## 2020-06-24 NOTE — Telephone Encounter (Signed)
-----   Message from De Hollingshead, [facility] sent at 06/23/2020  7:22 PM EDT ----- I'm wondering if the methotrexate is contributing. I think your labwork you ordered would check for liver dysfunction or CBC abnormalities, but MTX can cause cause fatigue that's neurologic. Has he discussed these concerns w/ rheumatology?  Catie ----- Message ----- From: Leone Haven, MD Sent: 06/23/2020   3:08 PM EDT To: De Hollingshead, [facility]  Hey Catie,   This patient has been having ongoing issues with nausea and fatigue. Can you look at his med list and let me know if any of the medications could be contributing? Thanks.   Randall Hiss

## 2020-06-25 DIAGNOSIS — R350 Frequency of micturition: Secondary | ICD-10-CM | POA: Diagnosis not present

## 2020-06-25 DIAGNOSIS — Z8546 Personal history of malignant neoplasm of prostate: Secondary | ICD-10-CM | POA: Diagnosis not present

## 2020-06-25 DIAGNOSIS — N5201 Erectile dysfunction due to arterial insufficiency: Secondary | ICD-10-CM | POA: Diagnosis not present

## 2020-06-26 NOTE — Telephone Encounter (Signed)
I called and spoke with the patient and informed him that the provider stated after speaking with the pharmacist that the medication Methotrexate could be the cause of his fatigue and he needed to discuss this with his rheumatologist and he understood and stated he would speak with him at his next visit.  Mylisa Brunson,cma

## 2020-07-01 DIAGNOSIS — M5416 Radiculopathy, lumbar region: Secondary | ICD-10-CM | POA: Diagnosis not present

## 2020-07-01 DIAGNOSIS — M62830 Muscle spasm of back: Secondary | ICD-10-CM | POA: Diagnosis not present

## 2020-07-01 DIAGNOSIS — M9903 Segmental and somatic dysfunction of lumbar region: Secondary | ICD-10-CM | POA: Diagnosis not present

## 2020-07-01 DIAGNOSIS — M5136 Other intervertebral disc degeneration, lumbar region: Secondary | ICD-10-CM | POA: Diagnosis not present

## 2020-07-09 DIAGNOSIS — M9903 Segmental and somatic dysfunction of lumbar region: Secondary | ICD-10-CM | POA: Diagnosis not present

## 2020-07-09 DIAGNOSIS — M62830 Muscle spasm of back: Secondary | ICD-10-CM | POA: Diagnosis not present

## 2020-07-09 DIAGNOSIS — M5136 Other intervertebral disc degeneration, lumbar region: Secondary | ICD-10-CM | POA: Diagnosis not present

## 2020-07-09 DIAGNOSIS — M5416 Radiculopathy, lumbar region: Secondary | ICD-10-CM | POA: Diagnosis not present

## 2020-07-15 DIAGNOSIS — M9903 Segmental and somatic dysfunction of lumbar region: Secondary | ICD-10-CM | POA: Diagnosis not present

## 2020-07-15 DIAGNOSIS — M5416 Radiculopathy, lumbar region: Secondary | ICD-10-CM | POA: Diagnosis not present

## 2020-07-15 DIAGNOSIS — M5136 Other intervertebral disc degeneration, lumbar region: Secondary | ICD-10-CM | POA: Diagnosis not present

## 2020-07-15 DIAGNOSIS — M62830 Muscle spasm of back: Secondary | ICD-10-CM | POA: Diagnosis not present

## 2020-07-17 DIAGNOSIS — M9903 Segmental and somatic dysfunction of lumbar region: Secondary | ICD-10-CM | POA: Diagnosis not present

## 2020-07-17 DIAGNOSIS — M62830 Muscle spasm of back: Secondary | ICD-10-CM | POA: Diagnosis not present

## 2020-07-17 DIAGNOSIS — M5136 Other intervertebral disc degeneration, lumbar region: Secondary | ICD-10-CM | POA: Diagnosis not present

## 2020-07-17 DIAGNOSIS — M5416 Radiculopathy, lumbar region: Secondary | ICD-10-CM | POA: Diagnosis not present

## 2020-07-18 DIAGNOSIS — M4716 Other spondylosis with myelopathy, lumbar region: Secondary | ICD-10-CM | POA: Diagnosis not present

## 2020-07-18 DIAGNOSIS — G894 Chronic pain syndrome: Secondary | ICD-10-CM | POA: Diagnosis not present

## 2020-07-18 DIAGNOSIS — M5416 Radiculopathy, lumbar region: Secondary | ICD-10-CM | POA: Diagnosis not present

## 2020-07-22 DIAGNOSIS — M5136 Other intervertebral disc degeneration, lumbar region: Secondary | ICD-10-CM | POA: Diagnosis not present

## 2020-07-22 DIAGNOSIS — M5416 Radiculopathy, lumbar region: Secondary | ICD-10-CM | POA: Diagnosis not present

## 2020-07-22 DIAGNOSIS — M9903 Segmental and somatic dysfunction of lumbar region: Secondary | ICD-10-CM | POA: Diagnosis not present

## 2020-07-22 DIAGNOSIS — M62830 Muscle spasm of back: Secondary | ICD-10-CM | POA: Diagnosis not present

## 2020-07-29 DIAGNOSIS — M5416 Radiculopathy, lumbar region: Secondary | ICD-10-CM | POA: Diagnosis not present

## 2020-07-29 DIAGNOSIS — M5136 Other intervertebral disc degeneration, lumbar region: Secondary | ICD-10-CM | POA: Diagnosis not present

## 2020-07-29 DIAGNOSIS — M9903 Segmental and somatic dysfunction of lumbar region: Secondary | ICD-10-CM | POA: Diagnosis not present

## 2020-07-29 DIAGNOSIS — M62830 Muscle spasm of back: Secondary | ICD-10-CM | POA: Diagnosis not present

## 2020-08-02 DIAGNOSIS — M47817 Spondylosis without myelopathy or radiculopathy, lumbosacral region: Secondary | ICD-10-CM | POA: Diagnosis not present

## 2020-08-04 ENCOUNTER — Ambulatory Visit: Payer: PPO | Admitting: Family Medicine

## 2020-08-05 DIAGNOSIS — M9903 Segmental and somatic dysfunction of lumbar region: Secondary | ICD-10-CM | POA: Diagnosis not present

## 2020-08-05 DIAGNOSIS — M5416 Radiculopathy, lumbar region: Secondary | ICD-10-CM | POA: Diagnosis not present

## 2020-08-05 DIAGNOSIS — M62830 Muscle spasm of back: Secondary | ICD-10-CM | POA: Diagnosis not present

## 2020-08-05 DIAGNOSIS — M5136 Other intervertebral disc degeneration, lumbar region: Secondary | ICD-10-CM | POA: Diagnosis not present

## 2020-08-19 DIAGNOSIS — M5416 Radiculopathy, lumbar region: Secondary | ICD-10-CM | POA: Diagnosis not present

## 2020-08-19 DIAGNOSIS — M9903 Segmental and somatic dysfunction of lumbar region: Secondary | ICD-10-CM | POA: Diagnosis not present

## 2020-08-19 DIAGNOSIS — M62830 Muscle spasm of back: Secondary | ICD-10-CM | POA: Diagnosis not present

## 2020-08-19 DIAGNOSIS — M5136 Other intervertebral disc degeneration, lumbar region: Secondary | ICD-10-CM | POA: Diagnosis not present

## 2020-08-20 DIAGNOSIS — Z85828 Personal history of other malignant neoplasm of skin: Secondary | ICD-10-CM | POA: Diagnosis not present

## 2020-08-20 DIAGNOSIS — D2262 Melanocytic nevi of left upper limb, including shoulder: Secondary | ICD-10-CM | POA: Diagnosis not present

## 2020-08-20 DIAGNOSIS — D485 Neoplasm of uncertain behavior of skin: Secondary | ICD-10-CM | POA: Diagnosis not present

## 2020-08-20 DIAGNOSIS — D0461 Carcinoma in situ of skin of right upper limb, including shoulder: Secondary | ICD-10-CM | POA: Diagnosis not present

## 2020-08-20 DIAGNOSIS — X32XXXA Exposure to sunlight, initial encounter: Secondary | ICD-10-CM | POA: Diagnosis not present

## 2020-08-20 DIAGNOSIS — D0439 Carcinoma in situ of skin of other parts of face: Secondary | ICD-10-CM | POA: Diagnosis not present

## 2020-08-20 DIAGNOSIS — D225 Melanocytic nevi of trunk: Secondary | ICD-10-CM | POA: Diagnosis not present

## 2020-08-20 DIAGNOSIS — D044 Carcinoma in situ of skin of scalp and neck: Secondary | ICD-10-CM | POA: Diagnosis not present

## 2020-08-20 DIAGNOSIS — L570 Actinic keratosis: Secondary | ICD-10-CM | POA: Diagnosis not present

## 2020-08-20 DIAGNOSIS — D2261 Melanocytic nevi of right upper limb, including shoulder: Secondary | ICD-10-CM | POA: Diagnosis not present

## 2020-08-20 DIAGNOSIS — D2271 Melanocytic nevi of right lower limb, including hip: Secondary | ICD-10-CM | POA: Diagnosis not present

## 2020-08-29 DIAGNOSIS — M5416 Radiculopathy, lumbar region: Secondary | ICD-10-CM | POA: Diagnosis not present

## 2020-08-29 DIAGNOSIS — M4716 Other spondylosis with myelopathy, lumbar region: Secondary | ICD-10-CM | POA: Diagnosis not present

## 2020-08-29 DIAGNOSIS — G894 Chronic pain syndrome: Secondary | ICD-10-CM | POA: Diagnosis not present

## 2020-08-29 DIAGNOSIS — M48061 Spinal stenosis, lumbar region without neurogenic claudication: Secondary | ICD-10-CM | POA: Diagnosis not present

## 2020-08-29 DIAGNOSIS — M25552 Pain in left hip: Secondary | ICD-10-CM | POA: Diagnosis not present

## 2020-08-29 DIAGNOSIS — M25559 Pain in unspecified hip: Secondary | ICD-10-CM | POA: Diagnosis not present

## 2020-08-29 DIAGNOSIS — M545 Low back pain, unspecified: Secondary | ICD-10-CM | POA: Diagnosis not present

## 2020-08-29 DIAGNOSIS — Z79891 Long term (current) use of opiate analgesic: Secondary | ICD-10-CM | POA: Diagnosis not present

## 2020-09-02 ENCOUNTER — Other Ambulatory Visit: Payer: Self-pay | Admitting: Surgery

## 2020-09-02 DIAGNOSIS — M5416 Radiculopathy, lumbar region: Secondary | ICD-10-CM | POA: Diagnosis not present

## 2020-09-02 DIAGNOSIS — M62830 Muscle spasm of back: Secondary | ICD-10-CM | POA: Diagnosis not present

## 2020-09-02 DIAGNOSIS — R110 Nausea: Secondary | ICD-10-CM | POA: Diagnosis not present

## 2020-09-02 DIAGNOSIS — M9903 Segmental and somatic dysfunction of lumbar region: Secondary | ICD-10-CM | POA: Diagnosis not present

## 2020-09-02 DIAGNOSIS — M5136 Other intervertebral disc degeneration, lumbar region: Secondary | ICD-10-CM | POA: Diagnosis not present

## 2020-09-02 DIAGNOSIS — R197 Diarrhea, unspecified: Secondary | ICD-10-CM | POA: Diagnosis not present

## 2020-09-03 ENCOUNTER — Other Ambulatory Visit: Payer: Self-pay | Admitting: Surgery

## 2020-09-03 DIAGNOSIS — R110 Nausea: Secondary | ICD-10-CM

## 2020-09-16 DIAGNOSIS — M5136 Other intervertebral disc degeneration, lumbar region: Secondary | ICD-10-CM | POA: Diagnosis not present

## 2020-09-16 DIAGNOSIS — M62830 Muscle spasm of back: Secondary | ICD-10-CM | POA: Diagnosis not present

## 2020-09-16 DIAGNOSIS — M5416 Radiculopathy, lumbar region: Secondary | ICD-10-CM | POA: Diagnosis not present

## 2020-09-16 DIAGNOSIS — M9903 Segmental and somatic dysfunction of lumbar region: Secondary | ICD-10-CM | POA: Diagnosis not present

## 2020-09-17 ENCOUNTER — Ambulatory Visit
Admission: RE | Admit: 2020-09-17 | Discharge: 2020-09-17 | Disposition: A | Discharge status: Home / Self Care | Payer: PPO | Source: Ambulatory Visit | Attending: Surgery | Admitting: Surgery

## 2020-09-17 DIAGNOSIS — R110 Nausea: Secondary | ICD-10-CM

## 2020-09-17 DIAGNOSIS — K760 Fatty (change of) liver, not elsewhere classified: Secondary | ICD-10-CM | POA: Diagnosis not present

## 2020-09-17 DIAGNOSIS — K8020 Calculus of gallbladder without cholecystitis without obstruction: Secondary | ICD-10-CM | POA: Diagnosis not present

## 2020-09-22 DIAGNOSIS — M545 Low back pain, unspecified: Secondary | ICD-10-CM | POA: Diagnosis not present

## 2020-09-22 DIAGNOSIS — M25559 Pain in unspecified hip: Secondary | ICD-10-CM | POA: Diagnosis not present

## 2020-09-22 DIAGNOSIS — M48061 Spinal stenosis, lumbar region without neurogenic claudication: Secondary | ICD-10-CM | POA: Diagnosis not present

## 2020-09-22 DIAGNOSIS — M4716 Other spondylosis with myelopathy, lumbar region: Secondary | ICD-10-CM | POA: Diagnosis not present

## 2020-09-22 DIAGNOSIS — G894 Chronic pain syndrome: Secondary | ICD-10-CM | POA: Diagnosis not present

## 2020-09-22 DIAGNOSIS — M5416 Radiculopathy, lumbar region: Secondary | ICD-10-CM | POA: Diagnosis not present

## 2020-09-24 ENCOUNTER — Other Ambulatory Visit: Payer: Self-pay | Admitting: Family Medicine

## 2020-09-24 DIAGNOSIS — M25552 Pain in left hip: Secondary | ICD-10-CM | POA: Diagnosis not present

## 2020-09-24 DIAGNOSIS — I10 Essential (primary) hypertension: Secondary | ICD-10-CM

## 2020-09-24 MED ORDER — AMLODIPINE BESYLATE 5 MG PO TABS: 5.0000 mg | ORAL_TABLET | Freq: Every day | ORAL | 3 refills | Status: DC

## 2020-09-29 ENCOUNTER — Ambulatory Visit (INDEPENDENT_AMBULATORY_CARE_PROVIDER_SITE_OTHER): Payer: PPO

## 2020-09-29 VITALS — Ht 70.51 in | Wt 193.0 lb

## 2020-09-29 DIAGNOSIS — Z0000 Encounter for general adult medical examination without abnormal findings: Secondary | ICD-10-CM

## 2020-09-29 NOTE — Progress Notes (Addendum)
Subjective:   Julian Pineda is a 74 y.o. male who presents for Medicare Annual/Subsequent preventive examination.  Review of Systems    No ROS.  Medicare Wellness Virtual Visit.   Cardiac Risk Factors include: advanced age (>81mn, >>41women)     Objective:    Today's Vitals   09/29/20 0843  Weight: 193 lb (87.5 kg)  Height: 5' 10.51" (1.791 m)   Body mass index is 27.29 kg/m.  Advanced Directives 09/29/2020 02/22/2020 09/25/2019 09/20/2018 01/28/2018 11/09/2017 09/19/2017  Does Patient Have a Medical Advance Directive? Yes No Yes Yes No Yes Yes  Type of AParamedicof A[place]edpatchLiving will - [place] will HV[place]Living will - Living will Living will;Healthcare Power of Attorney  Does patient want to make changes to medical advance directive? No - Patient declined - No - Patient declined No - Patient declined - - No - Patient declined  Copy of [place] Chart? No - copy requested - No - copy requested No - copy requested - - No - copy requested  Would patient like information on creating a medical advance directive? - - - - No - Patient declined - -    Current Medications (verified) Outpatient Encounter Medications as of 09/29/2020  Medication Sig   amLODipine (NORVASC) 5 MG tablet Take 1 tablet (5 mg total) by mouth daily.   cholecalciferol (VITAMIN D) 1000 units tablet Take 1,000 Units by mouth every morning.    famotidine (PEPCID) 10 MG tablet Take 1 tablet (10 mg total) by mouth 2 (two) times daily.   folic acid (FOLVITE) 1 MG tablet TAKE 1 TABLET EVERY DAY   losartan (COZAAR) 100 MG tablet TAKE 1 TABLET BY MOUTH DAILY   meloxicam (MOBIC) 15 MG tablet TAKE 1 TABLET BY MOUTH ONCE DAILY WITH FOOD   methotrexate 50 MG/2ML injection INJECT 1ML WEEKLY   metroNIDAZOLE (METROCREAM) 0.75 % cream Apply 1 application topically daily.    No facility-administered encounter medications  on file as of 09/29/2020.    Allergies (verified) Penicillins   History: Past Medical History:  Diagnosis Date   Diverticulosis of colon    GERD (gastroesophageal reflux disease)    "occas"   History of basal cell carcinoma (BCC) excision    09-25-2014  right lower eyelid s/p moh's dx /  left lower eyelid scheduled removal 07-05-2017   History of squamous cell carcinoma excision    01/ 2015  left lateral cheek  s/p  moh's sx   Hypertension    sees Dr. JJackalyn Lombard [place]   Nocturia    OA (osteoarthritis)    Prostate cancer ([facility] urologist-  dr eskidge/  oncologist- dr mTammi Klippel  dx 04/ 2018-- Stage T1c, Gleason 3+3,  PSA 7.1,  vol 38.6cc   Rheumatoid arteritis ([place]    rheumotologist-   dr bElpidio Galea  Rosacea    Dr. PSharlett Iles  Syncope 01/27/2018   Vitamin D deficiency    Past Surgical History:  Procedure Laterality Date   ACHILLES TENDON SURGERY Right 1984   COLONOSCOPY  2008   COLONOSCOPY WITH PROPOFOL N/A 11/09/2017   Procedure: COLONOSCOPY WITH PROPOFOL;  Surgeon: EManya Silvas MD;  [facility];  Service: Endoscopy;  Laterality: N/A;   MOHS SURGERY  09-25-2014;  11-27-2013   [facility]   09-25-2014 right lower eyelid /  11-27-2013  left lateral cheek   RADIOACTIVE SEED IMPLANT N/A 06/17/2017   Procedure: RADIOACTIVE SEED  IMPLANT/BRACHYTHERAPY IMPLANT;  Surgeon: Festus Aloe, MD;  Location[facility];  Service: Urology;  Laterality: N/A;   TONSILLECTOMY  child   TOTAL KNEE ARTHROPLASTY  12/04/2012   Procedure: TOTAL KNEE ARTHROPLASTY;  Surgeon: Vickey Huger, MD;  Location: [facility];  Service: Orthopedics;  Laterality: Left;  left total knee arthroplasty   VARICOSE VEIN SURGERY  2000   left leg   Family History  Problem Relation Age of Onset   Cancer Mother        Multiple Myeloma   Alcohol abuse Father    Arthritis Father    Prostate cancer Brother    Breast cancer Daughter    Hematuria Neg Hx    Sickle  cell anemia Neg Hx    Kidney cancer Neg Hx    Bladder Cancer Neg Hx    Social History   Socioeconomic History   Marital status: Married    Spouse name: Not on file   Number of children: Not on file   Years of education: Not on file   Highest education level: Not on file  Occupational History   Not on file  Tobacco Use   Smoking status: Never Smoker   Smokeless tobacco: Former Systems developer    Types: Nurse, children's Use: Never used  Substance and Sexual Activity   Alcohol use: Yes    Alcohol/week: 14.0 standard drinks    Types: 14 Shots of liquor per week    Comment: 2 per day   Drug use: No   Sexual activity: Yes  Other Topics Concern   Not on file  Social History Narrative   Moderate alcohol use   Lives in [place] with wife, has a daughter and son   Pets has a Engineer, structural   Diet: regular    Exercise. Treadmill 5 days a week   Likes to [employer] Strain: Low Risk    Difficulty of Paying Living Expenses: Not hard at all  Food Insecurity: No Food Insecurity   Worried About Charity fundraiser in the Last Year: Never true   Arboriculturist in the Last Year: Never true  Transportation Needs: No Transportation Needs   Lack of Transportation (Medical): No   Lack of Transportation (Non-Medical): No  Physical Activity: Sufficiently Active   Days of Exercise per Week: 3 days   Minutes of Exercise per Session: 60 min  Stress: No Stress Concern Present   Feeling of Stress : Not at all  Social Connections: Unknown   Frequency of Communication with Friends and Family: Not on file   Frequency of Social Gatherings with Friends and Family: Not on file   Attends Religious Services: Not on Electrical engineer or Organizations: Not on file   Attends Archivist Meetings: Not on file   Marital Status: Married    Tobacco Counseling Counseling given: Not  Answered   Clinical Intake:  Pre-visit preparation completed: Yes        Diabetes: No  How often do you need to have someone help you when you read instructions, pamphlets, or other written materials from your doctor or pharmacy?: 1 - Never   Interpreter Needed?: No      Activities of Daily Living In your present state of health, do you have any difficulty performing the following activities: 09/29/2020  Hearing? N  Vision? N  Difficulty  concentrating or making decisions? N  Walking or climbing stairs? N  Dressing or bathing? N  Doing errands, shopping? N  Preparing Food and eating ? N  Using the Toilet? N  In the past six months, have you accidently leaked urine? N  Do you have problems with loss of bowel control? N  Managing your Medications? N  Managing your Finances? N  Housekeeping or managing your Housekeeping? N  Some recent data might be hidden    Patient Care Team: Leone Haven, MD as PCP - General (Family Medicine)  Indicate any recent Medical Services you may have received from other than [facility] providers in the past year (date may be approximate).     Assessment:   This is a routine wellness examination for Julias.  I connected with Yousef today by telephone and verified that I am speaking with the correct person using two identifiers. Location patient: home Location provider: work Persons participating in the virtual visit: patient, Marine scientist.    I discussed the limitations, risks, security and privacy concerns of performing an evaluation and management service by telephone and the availability of in person appointments. The patient expressed understanding and verbally consented to this telephonic visit.    Interactive audio and video telecommunications were attempted between this provider and patient, however failed, due to patient having technical difficulties OR patient did not have access to video capability.  We continued and completed visit with  audio only.  Video connection was lost when less than 50% of the duration of the visit was complete, at which time the remainder of the visit was completed via audio only.  Some vital signs may be absent or patient reported.   Hearing/Vision screen  Hearing Screening   '125Hz'  '250Hz'  '500Hz'  '1000Hz'  '2000Hz'  '3000Hz'  '4000Hz'  '6000Hz'  '8000Hz'   Right ear:           Left ear:           Comments: Patient is able to hear conversational tones without difficulty. No issues reported.  Vision Screening Comments: Wears reader lenses  Visual acuity not assessed, virtual visit. They have seen their ophthalmologist in the last 12 months.  Dietary issues and exercise activities discussed: Current Exercise Habits: Home exercise routine, Type of exercise: calisthenics;strength training/weights;stretching, Time (Minutes): 60, Frequency (Times/Week): 3, Weekly Exercise (Minutes/Week): 180, Intensity: Moderate  Dairy free Good water intake  Goals     Maintain Healthy Lifestyle     Healthy diet Stay hydrated Stay active Make a decision regarding gall bladder surgery      Depression Screen PHQ 2/9 Scores 09/29/2020 06/23/2020 11/16/2019 09/25/2019 09/10/2019 09/20/2018 09/19/2017  PHQ - 2 Score 0 0 5 0 0 0 0  PHQ- 9 Score - - 11 - - - 0    Fall Risk Fall Risk  09/29/2020 06/23/2020 12/17/2019 09/25/2019 09/10/2019  Falls in the past year? 0 0 0 0 0  Number falls in past yr: 0 0 0 0 0  Injury with Fall? 0 0 - - -  Follow up Falls evaluation completed Falls evaluation completed Falls evaluation completed Falls prevention discussed;Education provided Falls evaluation completed   Handrails in use when climbing stairs? Yes Home free of loose throw rugs in walkways, pet beds, electrical cords, etc? Yes  Adequate lighting in your home to reduce risk of falls? Yes   ASSISTIVE DEVICES UTILIZED TO PREVENT FALLS: Life alert? No  Use of a cane, walker or w/c? No  Grab bars in the bathroom? No  Shower chair  or bench in  shower? No  Elevated toilet seat or a handicapped toilet? No   TIMED UP AND GO: Was the test performed? No . Virtual visit.  Cognitive Function: Patient is alert and oriented x3.  Denies difficulty focusing, making decisions, memory loss.  Enjoys reading. MMSE/6CIT deferred. Normal by direct observation.  MMSE - Mini Mental State Exam 09/19/2017  Orientation to time 5  Orientation to Place 5  Registration 3  Attention/ Calculation 5  Recall 3  Language- name 2 objects 2  Language- repeat 1  Language- follow 3 step command 3  Language- read & follow direction 1  Write a sentence 1  Copy design 1  Total score 30     6CIT Screen 09/25/2019 09/20/2018  What Year? 0 points 0 points  What month? 0 points 0 points  What time? 0 points 0 points  Count back from 20 0 points 0 points  Months in reverse 0 points 0 points  Repeat phrase 0 points 0 points  Total Score 0 0    Immunizations Immunization History  Administered Date(s) Administered   DTaP 02/19/2010   PFIZER SARS-COV-2 Vaccination 12/14/2019, 01/09/2020   Pneumococcal Conjugate-13 09/26/2019   Tdap 08/12/2015   Pneumococcal vaccine- Advised may receive this vaccine at local pharmacy or Health Dept. Aware to provide a copy of the vaccination record if obtained from local pharmacy or Health Dept. Verbalized acceptance and understanding.  Deferred.  Influenza vaccine- deferred.   Health Maintenance Health Maintenance  Topic Date Due   PNA vac Low Risk Adult (2 of 2 - PPSV23) 09/25/2020   INFLUENZA VACCINE  01/29/2021 (Originally 06/01/2020)   COLONOSCOPY  11/09/2020   TETANUS/TDAP  08/11/2025   COVID-19 Vaccine  Completed   Hepatitis C Screening  Completed    Colorectal cancer screening: Completed 11/09/17. Repeat every 3 years  Lung Cancer Screening: (Low Dose CT Chest recommended if Age 59-80 years, 30 pack-year currently smoking OR have quit w/in 15years.) does not qualify.   Hepatitis C Screening:  Completed 07/28/16.  Vision Screening: Recommended annual ophthalmology exams for early detection of glaucoma and other disorders of the eye. Is the patient up to date with their annual eye exam?  Yes  Who is the provider or what is the name of the office in which the patient attends annual eye exams? [facility]  Dental Screening: Recommended annual dental exams for proper oral hygiene. Visit every 6 months.   Community Resource Referral / Chronic Care Management: CRR required this visit?  No   CCM required this visit?  No      Plan:   .Schedule next routine office visit around 12/2020.  I have personally reviewed and noted the following in the patients chart:    Medical and social history  Use of alcohol, tobacco or illicit drugs   Current medications and supplements  Functional ability and status  Nutritional status  Physical activity  Advanced directives  List of other physicians  Hospitalizations, surgeries, and ER visits in previous 12 months  Vitals  Screenings to include cognitive, depression, and falls  Referrals and appointments  In addition, I have reviewed and discussed with patient certain preventive protocols, quality metrics, and best practice recommendations. A written personalized care plan for preventive services as well as general preventive health recommendations were provided to patient via mychart.     Varney Biles, LPN   [id number]   Reviewed above information.  Agree with assessment and plan.    Dr  Scott

## 2020-09-29 NOTE — Patient Instructions (Addendum)
Julian Pineda , Thank you for taking time to come for your Medicare Wellness Visit. I appreciate your ongoing commitment to your health goals. Please review the following plan we discussed and let me know if I can assist you in the future.   These are the goals we discussed: Goals    . Maintain Healthy Lifestyle     Healthy diet Stay hydrated Stay active Make a decision regarding gall bladder surgery       This is a list of the screening recommended for you and due dates:  Health Maintenance  Topic Date Due  . Pneumonia vaccines (2 of 2 - PPSV23) 09/25/2020  . Flu Shot  01/29/2021*  . Colon Cancer Screening  11/09/2020  . Tetanus Vaccine  08/11/2025  . COVID-19 Vaccine  Completed  .  Hepatitis C: One time screening is recommended by Center for Disease Control  (CDC) for  adults born from [birth year] through 1963.   Completed  *Topic was postponed. The date shown is not the original due date.    Immunizations Immunization History  Administered Date(s) Administered  . DTaP 02/19/2010  . PFIZER SARS-COV-2 Vaccination 12/14/2019, 01/09/2020  . Pneumococcal Conjugate-13 09/26/2019  . Tdap 08/12/2015   Advanced directives: End of life planning; Advance aging; Advanced directives discussed.  Copy of current HCPOA/Living Will requested.    Conditions/risks identified: none new.   Next appointment: Follow up in one year for your annual wellness visit.   Preventive Care 6 Years and Older, Male Preventive care refers to lifestyle choices and visits with your health care provider that can promote health and wellness. What does preventive care include?  A yearly physical exam. This is also called an annual well check.  Dental exams once or twice a year.  Routine eye exams. Ask your health care provider how often you should have your eyes checked.  Personal lifestyle choices, including:  Daily care of your teeth and gums.  Regular physical activity.  Eating a healthy  diet.  Avoiding tobacco and drug use.  Limiting alcohol use.  Practicing safe sex.  Taking low doses of aspirin every day.  Taking vitamin and mineral supplements as recommended by your health care provider. What happens during an annual well check? The services and screenings done by your health care provider during your annual well check will depend on your age, overall health, lifestyle risk factors, and family history of disease. Counseling  Your health care provider may ask you questions about your:  Alcohol use.  Tobacco use.  Drug use.  Emotional well-being.  Home and relationship well-being.  Sexual activity.  Eating habits.  History of falls.  Memory and ability to understand (cognition).  Work and work Statistician. Screening  You may have the following tests or measurements:  Height, weight, and BMI.  Blood pressure.  Lipid and cholesterol levels. These may be checked every 5 years, or more frequently if you are over 76 years old.  Skin check.  Lung cancer screening. You may have this screening every year starting at age 42 if you have a 30-pack-year history of smoking and currently smoke or have quit within the past 15 years.  Fecal occult blood test (FOBT) of the stool. You may have this test every year starting at age 84.  Flexible sigmoidoscopy or colonoscopy. You may have a sigmoidoscopy every 5 years or a colonoscopy every 10 years starting at age 41.  Prostate cancer screening. Recommendations will vary depending on your family history and other  risks.  Hepatitis C blood test.  Hepatitis B blood test.  Sexually transmitted disease (STD) testing.  Diabetes screening. This is done by checking your blood sugar (glucose) after you have not eaten for a while (fasting). You may have this done every 1-3 years.  Abdominal aortic aneurysm (AAA) screening. You may need this if you are a current or former smoker.  Osteoporosis. You may be screened  starting at age 53 if you are at high risk. Talk with your health care provider about your test results, treatment options, and if necessary, the need for more tests. Vaccines  Your health care provider may recommend certain vaccines, such as:  Influenza vaccine. This is recommended every year.  Tetanus, diphtheria, and acellular pertussis (Tdap, Td) vaccine. You may need a Td booster every 10 years.  Zoster vaccine. You may need this after age 19.  Pneumococcal 13-valent conjugate (PCV13) vaccine. One dose is recommended after age 49.  Pneumococcal polysaccharide (PPSV23) vaccine. One dose is recommended after age 41. Talk to your health care provider about which screenings and vaccines you need and how often you need them. This information is not intended to replace advice given to you by your health care provider. Make sure you discuss any questions you have with your health care provider. Document Released: 11/14/2015 Document Revised: 07/07/2016 Document Reviewed: 08/19/2015 Elsevier Interactive Patient Education  2017 [place] Prevention in the Home Falls can cause injuries. They can happen to people of all ages. There are many things you can do to make your home safe and to help prevent falls. What can I do on the outside of my home?  Regularly fix the edges of walkways and driveways and fix any cracks.  Remove anything that might make you trip as you walk through a door, such as a raised step or threshold.  Trim any bushes or trees on the path to your home.  Use bright outdoor lighting.  Clear any walking paths of anything that might make someone trip, such as rocks or tools.  Regularly check to see if handrails are loose or broken. Make sure that both sides of any steps have handrails.  Any raised decks and porches should have guardrails on the edges.  Have any leaves, snow, or ice cleared regularly.  Use sand or salt on walking paths during  winter.  Clean up any spills in your garage right away. This includes oil or grease spills. What can I do in the bathroom?  Use night lights.  Install grab bars by the toilet and in the tub and shower. Do not use towel bars as grab bars.  Use non-skid mats or decals in the tub or shower.  If you need to sit down in the shower, use a plastic, non-slip stool.  Keep the floor dry. Clean up any water that spills on the floor as soon as it happens.  Remove soap buildup in the tub or shower regularly.  Attach bath mats securely with double-sided non-slip rug tape.  Do not have throw rugs and other things on the floor that can make you trip. What can I do in the bedroom?  Use night lights.  Make sure that you have a light by your bed that is easy to reach.  Do not use any sheets or blankets that are too big for your bed. They should not hang down onto the floor.  Have a firm chair that has side arms. You can use this for support  while you get dressed.  Do not have throw rugs and other things on the floor that can make you trip. What can I do in the kitchen?  Clean up any spills right away.  Avoid walking on wet floors.  Keep items that you use a lot in easy-to-reach places.  If you need to reach something above you, use a strong step stool that has a grab bar.  Keep electrical cords out of the way.  Do not use floor polish or wax that makes floors slippery. If you must use wax, use non-skid floor wax.  Do not have throw rugs and other things on the floor that can make you trip. What can I do with my stairs?  Do not leave any items on the stairs.  Make sure that there are handrails on both sides of the stairs and use them. Fix handrails that are broken or loose. Make sure that handrails are as long as the stairways.  Check any carpeting to make sure that it is firmly attached to the stairs. Fix any carpet that is loose or worn.  Avoid having throw rugs at the top or  bottom of the stairs. If you do have throw rugs, attach them to the floor with carpet tape.  Make sure that you have a light switch at the top of the stairs and the bottom of the stairs. If you do not have them, ask someone to add them for you. What else can I do to help prevent falls?  Wear shoes that:  Do not have high heels.  Have rubber bottoms.  Are comfortable and fit you well.  Are closed at the toe. Do not wear sandals.  If you use a stepladder:  Make sure that it is fully opened. Do not climb a closed stepladder.  Make sure that both sides of the stepladder are locked into place.  Ask someone to hold it for you, if possible.  Clearly mark and make sure that you can see:  Any grab bars or handrails.  First and last steps.  Where the edge of each step is.  Use tools that help you move around (mobility aids) if they are needed. These include:  Canes.  Walkers.  Scooters.  Crutches.  Turn on the lights when you go into a dark area. Replace any light bulbs as soon as they burn out.  Set up your furniture so you have a clear path. Avoid moving your furniture around.  If any of your floors are uneven, fix them.  If there are any pets around you, be aware of where they are.  Review your medicines with your doctor. Some medicines can make you feel dizzy. This can increase your chance of falling. Ask your doctor what other things that you can do to help prevent falls. This information is not intended to replace advice given to you by your health care provider. Make sure you discuss any questions you have with your health care provider. Document Released: 08/14/2009 Document Revised: 03/25/2016 Document Reviewed: 11/22/2014 Elsevier Interactive Patient Education  2017 [employer].

## 2020-09-30 DIAGNOSIS — M9903 Segmental and somatic dysfunction of lumbar region: Secondary | ICD-10-CM | POA: Diagnosis not present

## 2020-09-30 DIAGNOSIS — M5416 Radiculopathy, lumbar region: Secondary | ICD-10-CM | POA: Diagnosis not present

## 2020-09-30 DIAGNOSIS — M62830 Muscle spasm of back: Secondary | ICD-10-CM | POA: Diagnosis not present

## 2020-09-30 DIAGNOSIS — M5136 Other intervertebral disc degeneration, lumbar region: Secondary | ICD-10-CM | POA: Diagnosis not present

## 2020-10-01 DIAGNOSIS — Z79899 Other long term (current) drug therapy: Secondary | ICD-10-CM | POA: Diagnosis not present

## 2020-10-01 DIAGNOSIS — M19049 Primary osteoarthritis, unspecified hand: Secondary | ICD-10-CM | POA: Diagnosis not present

## 2020-10-01 DIAGNOSIS — E663 Overweight: Secondary | ICD-10-CM | POA: Diagnosis not present

## 2020-10-01 DIAGNOSIS — Z6828 Body mass index (BMI) 28.0-28.9, adult: Secondary | ICD-10-CM | POA: Diagnosis not present

## 2020-10-01 DIAGNOSIS — R5382 Chronic fatigue, unspecified: Secondary | ICD-10-CM | POA: Diagnosis not present

## 2020-10-01 DIAGNOSIS — M0589 Other rheumatoid arthritis with rheumatoid factor of multiple sites: Secondary | ICD-10-CM | POA: Diagnosis not present

## 2020-10-02 DIAGNOSIS — D0439 Carcinoma in situ of skin of other parts of face: Secondary | ICD-10-CM | POA: Diagnosis not present

## 2020-10-07 DIAGNOSIS — M25552 Pain in left hip: Secondary | ICD-10-CM | POA: Diagnosis not present

## 2020-10-07 DIAGNOSIS — M1612 Unilateral primary osteoarthritis, left hip: Secondary | ICD-10-CM | POA: Diagnosis not present

## 2020-10-07 DIAGNOSIS — M5416 Radiculopathy, lumbar region: Secondary | ICD-10-CM | POA: Diagnosis not present

## 2020-10-09 DIAGNOSIS — D044 Carcinoma in situ of skin of scalp and neck: Secondary | ICD-10-CM | POA: Diagnosis not present

## 2020-10-09 DIAGNOSIS — D0461 Carcinoma in situ of skin of right upper limb, including shoulder: Secondary | ICD-10-CM | POA: Diagnosis not present

## 2020-10-14 DIAGNOSIS — M9903 Segmental and somatic dysfunction of lumbar region: Secondary | ICD-10-CM | POA: Diagnosis not present

## 2020-10-14 DIAGNOSIS — M5416 Radiculopathy, lumbar region: Secondary | ICD-10-CM | POA: Diagnosis not present

## 2020-10-14 DIAGNOSIS — M5136 Other intervertebral disc degeneration, lumbar region: Secondary | ICD-10-CM | POA: Diagnosis not present

## 2020-10-14 DIAGNOSIS — M62830 Muscle spasm of back: Secondary | ICD-10-CM | POA: Diagnosis not present

## 2020-10-28 DIAGNOSIS — M5416 Radiculopathy, lumbar region: Secondary | ICD-10-CM | POA: Diagnosis not present

## 2020-10-28 DIAGNOSIS — M9903 Segmental and somatic dysfunction of lumbar region: Secondary | ICD-10-CM | POA: Diagnosis not present

## 2020-10-28 DIAGNOSIS — M5136 Other intervertebral disc degeneration, lumbar region: Secondary | ICD-10-CM | POA: Diagnosis not present

## 2020-10-28 DIAGNOSIS — M62830 Muscle spasm of back: Secondary | ICD-10-CM | POA: Diagnosis not present

## 2020-10-29 DIAGNOSIS — G894 Chronic pain syndrome: Secondary | ICD-10-CM | POA: Diagnosis not present

## 2020-10-29 DIAGNOSIS — M48061 Spinal stenosis, lumbar region without neurogenic claudication: Secondary | ICD-10-CM | POA: Diagnosis not present

## 2020-10-29 DIAGNOSIS — M25559 Pain in unspecified hip: Secondary | ICD-10-CM | POA: Diagnosis not present

## 2020-10-29 DIAGNOSIS — M4716 Other spondylosis with myelopathy, lumbar region: Secondary | ICD-10-CM | POA: Diagnosis not present

## 2020-10-29 DIAGNOSIS — M5416 Radiculopathy, lumbar region: Secondary | ICD-10-CM | POA: Diagnosis not present

## 2020-10-29 DIAGNOSIS — M545 Low back pain, unspecified: Secondary | ICD-10-CM | POA: Diagnosis not present

## 2020-11-11 DIAGNOSIS — M5416 Radiculopathy, lumbar region: Secondary | ICD-10-CM | POA: Diagnosis not present

## 2020-11-11 DIAGNOSIS — M5136 Other intervertebral disc degeneration, lumbar region: Secondary | ICD-10-CM | POA: Diagnosis not present

## 2020-11-11 DIAGNOSIS — M9903 Segmental and somatic dysfunction of lumbar region: Secondary | ICD-10-CM | POA: Diagnosis not present

## 2020-11-11 DIAGNOSIS — M62830 Muscle spasm of back: Secondary | ICD-10-CM | POA: Diagnosis not present

## 2020-11-13 DIAGNOSIS — N130 Hydronephrosis with ureteropelvic junction obstruction: Secondary | ICD-10-CM | POA: Diagnosis not present

## 2020-11-13 DIAGNOSIS — E349 Endocrine disorder, unspecified: Secondary | ICD-10-CM | POA: Diagnosis not present

## 2020-11-13 DIAGNOSIS — R310 Gross hematuria: Secondary | ICD-10-CM | POA: Diagnosis not present

## 2020-11-25 DIAGNOSIS — M9903 Segmental and somatic dysfunction of lumbar region: Secondary | ICD-10-CM | POA: Diagnosis not present

## 2020-11-25 DIAGNOSIS — M62830 Muscle spasm of back: Secondary | ICD-10-CM | POA: Diagnosis not present

## 2020-11-25 DIAGNOSIS — M5416 Radiculopathy, lumbar region: Secondary | ICD-10-CM | POA: Diagnosis not present

## 2020-11-25 DIAGNOSIS — M5136 Other intervertebral disc degeneration, lumbar region: Secondary | ICD-10-CM | POA: Diagnosis not present

## 2020-12-02 DIAGNOSIS — R310 Gross hematuria: Secondary | ICD-10-CM | POA: Diagnosis not present

## 2020-12-02 DIAGNOSIS — K8020 Calculus of gallbladder without cholecystitis without obstruction: Secondary | ICD-10-CM | POA: Diagnosis not present

## 2020-12-02 DIAGNOSIS — K5730 Diverticulosis of large intestine without perforation or abscess without bleeding: Secondary | ICD-10-CM | POA: Diagnosis not present

## 2020-12-08 ENCOUNTER — Other Ambulatory Visit: Payer: Self-pay | Admitting: Urology

## 2020-12-08 DIAGNOSIS — Q266 Portal vein-hepatic artery fistula: Secondary | ICD-10-CM

## 2020-12-09 DIAGNOSIS — M5136 Other intervertebral disc degeneration, lumbar region: Secondary | ICD-10-CM | POA: Diagnosis not present

## 2020-12-09 DIAGNOSIS — M5416 Radiculopathy, lumbar region: Secondary | ICD-10-CM | POA: Diagnosis not present

## 2020-12-09 DIAGNOSIS — M9903 Segmental and somatic dysfunction of lumbar region: Secondary | ICD-10-CM | POA: Diagnosis not present

## 2020-12-09 DIAGNOSIS — M62830 Muscle spasm of back: Secondary | ICD-10-CM | POA: Diagnosis not present

## 2020-12-22 ENCOUNTER — Other Ambulatory Visit: Payer: Self-pay | Admitting: Family Medicine

## 2020-12-23 DIAGNOSIS — M9903 Segmental and somatic dysfunction of lumbar region: Secondary | ICD-10-CM | POA: Diagnosis not present

## 2020-12-23 DIAGNOSIS — M5136 Other intervertebral disc degeneration, lumbar region: Secondary | ICD-10-CM | POA: Diagnosis not present

## 2020-12-23 DIAGNOSIS — M5416 Radiculopathy, lumbar region: Secondary | ICD-10-CM | POA: Diagnosis not present

## 2020-12-23 DIAGNOSIS — M62830 Muscle spasm of back: Secondary | ICD-10-CM | POA: Diagnosis not present

## 2020-12-27 ENCOUNTER — Ambulatory Visit
Admission: RE | Admit: 2020-12-27 | Discharge: 2020-12-27 | Disposition: A | Discharge status: Home / Self Care | Payer: PPO | Source: Ambulatory Visit | Attending: Urology | Admitting: Urology

## 2020-12-27 ENCOUNTER — Other Ambulatory Visit: Payer: Self-pay

## 2020-12-27 DIAGNOSIS — K760 Fatty (change of) liver, not elsewhere classified: Secondary | ICD-10-CM | POA: Diagnosis not present

## 2020-12-27 DIAGNOSIS — N281 Cyst of kidney, acquired: Secondary | ICD-10-CM | POA: Diagnosis not present

## 2020-12-27 DIAGNOSIS — Q266 Portal vein-hepatic artery fistula: Secondary | ICD-10-CM

## 2020-12-27 DIAGNOSIS — D1809 Hemangioma of other sites: Secondary | ICD-10-CM | POA: Diagnosis not present

## 2020-12-27 MED ORDER — GADOBENATE DIMEGLUMINE 529 MG/ML IV SOLN
18.0000 mL | Freq: Once | INTRAVENOUS | Status: AC | PRN
  Administered 2020-12-27: 18 mL via INTRAVENOUS

## 2020-12-31 DIAGNOSIS — M0589 Other rheumatoid arthritis with rheumatoid factor of multiple sites: Secondary | ICD-10-CM | POA: Diagnosis not present

## 2020-12-31 DIAGNOSIS — Z79899 Other long term (current) drug therapy: Secondary | ICD-10-CM | POA: Diagnosis not present

## 2021-01-06 DIAGNOSIS — M9903 Segmental and somatic dysfunction of lumbar region: Secondary | ICD-10-CM | POA: Diagnosis not present

## 2021-01-06 DIAGNOSIS — M62830 Muscle spasm of back: Secondary | ICD-10-CM | POA: Diagnosis not present

## 2021-01-06 DIAGNOSIS — M5416 Radiculopathy, lumbar region: Secondary | ICD-10-CM | POA: Diagnosis not present

## 2021-01-06 DIAGNOSIS — M5136 Other intervertebral disc degeneration, lumbar region: Secondary | ICD-10-CM | POA: Diagnosis not present

## 2021-01-14 DIAGNOSIS — X32XXXA Exposure to sunlight, initial encounter: Secondary | ICD-10-CM | POA: Diagnosis not present

## 2021-01-14 DIAGNOSIS — C44722 Squamous cell carcinoma of skin of right lower limb, including hip: Secondary | ICD-10-CM | POA: Diagnosis not present

## 2021-01-14 DIAGNOSIS — D0471 Carcinoma in situ of skin of right lower limb, including hip: Secondary | ICD-10-CM | POA: Diagnosis not present

## 2021-01-14 DIAGNOSIS — L570 Actinic keratosis: Secondary | ICD-10-CM | POA: Diagnosis not present

## 2021-01-14 DIAGNOSIS — R208 Other disturbances of skin sensation: Secondary | ICD-10-CM | POA: Diagnosis not present

## 2021-01-14 DIAGNOSIS — Z08 Encounter for follow-up examination after completed treatment for malignant neoplasm: Secondary | ICD-10-CM | POA: Diagnosis not present

## 2021-01-14 DIAGNOSIS — C44399 Other specified malignant neoplasm of skin of other parts of face: Secondary | ICD-10-CM | POA: Diagnosis not present

## 2021-01-14 DIAGNOSIS — L821 Other seborrheic keratosis: Secondary | ICD-10-CM | POA: Diagnosis not present

## 2021-01-14 DIAGNOSIS — D485 Neoplasm of uncertain behavior of skin: Secondary | ICD-10-CM | POA: Diagnosis not present

## 2021-01-14 DIAGNOSIS — Z85828 Personal history of other malignant neoplasm of skin: Secondary | ICD-10-CM | POA: Diagnosis not present

## 2021-01-20 DIAGNOSIS — M5416 Radiculopathy, lumbar region: Secondary | ICD-10-CM | POA: Diagnosis not present

## 2021-01-20 DIAGNOSIS — M5136 Other intervertebral disc degeneration, lumbar region: Secondary | ICD-10-CM | POA: Diagnosis not present

## 2021-01-20 DIAGNOSIS — M62830 Muscle spasm of back: Secondary | ICD-10-CM | POA: Diagnosis not present

## 2021-01-20 DIAGNOSIS — M9903 Segmental and somatic dysfunction of lumbar region: Secondary | ICD-10-CM | POA: Diagnosis not present

## 2021-02-03 DIAGNOSIS — M5416 Radiculopathy, lumbar region: Secondary | ICD-10-CM | POA: Diagnosis not present

## 2021-02-03 DIAGNOSIS — M62830 Muscle spasm of back: Secondary | ICD-10-CM | POA: Diagnosis not present

## 2021-02-03 DIAGNOSIS — M5136 Other intervertebral disc degeneration, lumbar region: Secondary | ICD-10-CM | POA: Diagnosis not present

## 2021-02-03 DIAGNOSIS — M9903 Segmental and somatic dysfunction of lumbar region: Secondary | ICD-10-CM | POA: Diagnosis not present

## 2021-02-13 ENCOUNTER — Encounter: Payer: Self-pay | Admitting: Family Medicine

## 2021-02-13 DIAGNOSIS — Z1211 Encounter for screening for malignant neoplasm of colon: Secondary | ICD-10-CM

## 2021-02-16 DIAGNOSIS — C44319 Basal cell carcinoma of skin of other parts of face: Secondary | ICD-10-CM | POA: Diagnosis not present

## 2021-02-17 DIAGNOSIS — M62830 Muscle spasm of back: Secondary | ICD-10-CM | POA: Diagnosis not present

## 2021-02-17 DIAGNOSIS — M5416 Radiculopathy, lumbar region: Secondary | ICD-10-CM | POA: Diagnosis not present

## 2021-02-17 DIAGNOSIS — M5136 Other intervertebral disc degeneration, lumbar region: Secondary | ICD-10-CM | POA: Diagnosis not present

## 2021-02-17 DIAGNOSIS — M9903 Segmental and somatic dysfunction of lumbar region: Secondary | ICD-10-CM | POA: Diagnosis not present

## 2021-02-23 DIAGNOSIS — L570 Actinic keratosis: Secondary | ICD-10-CM | POA: Diagnosis not present

## 2021-02-23 DIAGNOSIS — D485 Neoplasm of uncertain behavior of skin: Secondary | ICD-10-CM | POA: Diagnosis not present

## 2021-02-23 DIAGNOSIS — C44722 Squamous cell carcinoma of skin of right lower limb, including hip: Secondary | ICD-10-CM | POA: Diagnosis not present

## 2021-02-25 DIAGNOSIS — M48061 Spinal stenosis, lumbar region without neurogenic claudication: Secondary | ICD-10-CM | POA: Diagnosis not present

## 2021-02-25 DIAGNOSIS — M5416 Radiculopathy, lumbar region: Secondary | ICD-10-CM | POA: Diagnosis not present

## 2021-02-25 DIAGNOSIS — G894 Chronic pain syndrome: Secondary | ICD-10-CM | POA: Diagnosis not present

## 2021-02-25 DIAGNOSIS — M545 Low back pain, unspecified: Secondary | ICD-10-CM | POA: Diagnosis not present

## 2021-02-25 DIAGNOSIS — M4716 Other spondylosis with myelopathy, lumbar region: Secondary | ICD-10-CM | POA: Diagnosis not present

## 2021-02-25 DIAGNOSIS — M25559 Pain in unspecified hip: Secondary | ICD-10-CM | POA: Diagnosis not present

## 2021-03-03 DIAGNOSIS — M5416 Radiculopathy, lumbar region: Secondary | ICD-10-CM | POA: Diagnosis not present

## 2021-03-03 DIAGNOSIS — M9903 Segmental and somatic dysfunction of lumbar region: Secondary | ICD-10-CM | POA: Diagnosis not present

## 2021-03-03 DIAGNOSIS — M62830 Muscle spasm of back: Secondary | ICD-10-CM | POA: Diagnosis not present

## 2021-03-03 DIAGNOSIS — M5136 Other intervertebral disc degeneration, lumbar region: Secondary | ICD-10-CM | POA: Diagnosis not present

## 2021-03-04 DIAGNOSIS — M25552 Pain in left hip: Secondary | ICD-10-CM | POA: Diagnosis not present

## 2021-03-09 DIAGNOSIS — D0471 Carcinoma in situ of skin of right lower limb, including hip: Secondary | ICD-10-CM | POA: Diagnosis not present

## 2021-03-17 DIAGNOSIS — M5136 Other intervertebral disc degeneration, lumbar region: Secondary | ICD-10-CM | POA: Diagnosis not present

## 2021-03-17 DIAGNOSIS — M62830 Muscle spasm of back: Secondary | ICD-10-CM | POA: Diagnosis not present

## 2021-03-17 DIAGNOSIS — M5416 Radiculopathy, lumbar region: Secondary | ICD-10-CM | POA: Diagnosis not present

## 2021-03-17 DIAGNOSIS — M9903 Segmental and somatic dysfunction of lumbar region: Secondary | ICD-10-CM | POA: Diagnosis not present

## 2021-03-31 DIAGNOSIS — M62830 Muscle spasm of back: Secondary | ICD-10-CM | POA: Diagnosis not present

## 2021-03-31 DIAGNOSIS — M9903 Segmental and somatic dysfunction of lumbar region: Secondary | ICD-10-CM | POA: Diagnosis not present

## 2021-03-31 DIAGNOSIS — M5136 Other intervertebral disc degeneration, lumbar region: Secondary | ICD-10-CM | POA: Diagnosis not present

## 2021-03-31 DIAGNOSIS — M5416 Radiculopathy, lumbar region: Secondary | ICD-10-CM | POA: Diagnosis not present

## 2021-04-01 DIAGNOSIS — M0589 Other rheumatoid arthritis with rheumatoid factor of multiple sites: Secondary | ICD-10-CM | POA: Diagnosis not present

## 2021-04-01 DIAGNOSIS — E669 Obesity, unspecified: Secondary | ICD-10-CM | POA: Diagnosis not present

## 2021-04-01 DIAGNOSIS — M19049 Primary osteoarthritis, unspecified hand: Secondary | ICD-10-CM | POA: Diagnosis not present

## 2021-04-01 DIAGNOSIS — R5382 Chronic fatigue, unspecified: Secondary | ICD-10-CM | POA: Diagnosis not present

## 2021-04-01 DIAGNOSIS — Z79899 Other long term (current) drug therapy: Secondary | ICD-10-CM | POA: Diagnosis not present

## 2021-04-01 DIAGNOSIS — Z6828 Body mass index (BMI) 28.0-28.9, adult: Secondary | ICD-10-CM | POA: Diagnosis not present

## 2021-04-14 DIAGNOSIS — M5416 Radiculopathy, lumbar region: Secondary | ICD-10-CM | POA: Diagnosis not present

## 2021-04-14 DIAGNOSIS — M9903 Segmental and somatic dysfunction of lumbar region: Secondary | ICD-10-CM | POA: Diagnosis not present

## 2021-04-14 DIAGNOSIS — M62830 Muscle spasm of back: Secondary | ICD-10-CM | POA: Diagnosis not present

## 2021-04-14 DIAGNOSIS — M5136 Other intervertebral disc degeneration, lumbar region: Secondary | ICD-10-CM | POA: Diagnosis not present

## 2021-04-22 DIAGNOSIS — G894 Chronic pain syndrome: Secondary | ICD-10-CM | POA: Diagnosis not present

## 2021-04-22 DIAGNOSIS — M25559 Pain in unspecified hip: Secondary | ICD-10-CM | POA: Diagnosis not present

## 2021-04-22 DIAGNOSIS — M545 Low back pain, unspecified: Secondary | ICD-10-CM | POA: Diagnosis not present

## 2021-04-22 DIAGNOSIS — M48061 Spinal stenosis, lumbar region without neurogenic claudication: Secondary | ICD-10-CM | POA: Diagnosis not present

## 2021-04-22 DIAGNOSIS — M4716 Other spondylosis with myelopathy, lumbar region: Secondary | ICD-10-CM | POA: Diagnosis not present

## 2021-04-22 DIAGNOSIS — M5416 Radiculopathy, lumbar region: Secondary | ICD-10-CM | POA: Diagnosis not present

## 2021-04-28 DIAGNOSIS — M9903 Segmental and somatic dysfunction of lumbar region: Secondary | ICD-10-CM | POA: Diagnosis not present

## 2021-04-28 DIAGNOSIS — M5136 Other intervertebral disc degeneration, lumbar region: Secondary | ICD-10-CM | POA: Diagnosis not present

## 2021-04-28 DIAGNOSIS — M5416 Radiculopathy, lumbar region: Secondary | ICD-10-CM | POA: Diagnosis not present

## 2021-04-28 DIAGNOSIS — M62830 Muscle spasm of back: Secondary | ICD-10-CM | POA: Diagnosis not present

## 2021-05-08 DIAGNOSIS — K8020 Calculus of gallbladder without cholecystitis without obstruction: Secondary | ICD-10-CM | POA: Diagnosis not present

## 2021-05-08 DIAGNOSIS — Z86010 Personal history of colonic polyps: Secondary | ICD-10-CM | POA: Diagnosis not present

## 2021-05-08 DIAGNOSIS — R142 Eructation: Secondary | ICD-10-CM | POA: Diagnosis not present

## 2021-05-08 DIAGNOSIS — K5730 Diverticulosis of large intestine without perforation or abscess without bleeding: Secondary | ICD-10-CM | POA: Diagnosis not present

## 2021-05-08 DIAGNOSIS — R110 Nausea: Secondary | ICD-10-CM | POA: Diagnosis not present

## 2021-05-12 DIAGNOSIS — M5416 Radiculopathy, lumbar region: Secondary | ICD-10-CM | POA: Diagnosis not present

## 2021-05-12 DIAGNOSIS — M62830 Muscle spasm of back: Secondary | ICD-10-CM | POA: Diagnosis not present

## 2021-05-12 DIAGNOSIS — M9903 Segmental and somatic dysfunction of lumbar region: Secondary | ICD-10-CM | POA: Diagnosis not present

## 2021-05-12 DIAGNOSIS — M5136 Other intervertebral disc degeneration, lumbar region: Secondary | ICD-10-CM | POA: Diagnosis not present

## 2021-06-02 DIAGNOSIS — M5136 Other intervertebral disc degeneration, lumbar region: Secondary | ICD-10-CM | POA: Diagnosis not present

## 2021-06-02 DIAGNOSIS — M5416 Radiculopathy, lumbar region: Secondary | ICD-10-CM | POA: Diagnosis not present

## 2021-06-02 DIAGNOSIS — M9903 Segmental and somatic dysfunction of lumbar region: Secondary | ICD-10-CM | POA: Diagnosis not present

## 2021-06-02 DIAGNOSIS — M62830 Muscle spasm of back: Secondary | ICD-10-CM | POA: Diagnosis not present

## 2021-06-03 DIAGNOSIS — M25552 Pain in left hip: Secondary | ICD-10-CM | POA: Diagnosis not present

## 2021-06-16 DIAGNOSIS — M62830 Muscle spasm of back: Secondary | ICD-10-CM | POA: Diagnosis not present

## 2021-06-16 DIAGNOSIS — M5416 Radiculopathy, lumbar region: Secondary | ICD-10-CM | POA: Diagnosis not present

## 2021-06-16 DIAGNOSIS — M9903 Segmental and somatic dysfunction of lumbar region: Secondary | ICD-10-CM | POA: Diagnosis not present

## 2021-06-16 DIAGNOSIS — M5136 Other intervertebral disc degeneration, lumbar region: Secondary | ICD-10-CM | POA: Diagnosis not present

## 2021-06-24 DIAGNOSIS — M545 Low back pain, unspecified: Secondary | ICD-10-CM | POA: Diagnosis not present

## 2021-06-24 DIAGNOSIS — M4716 Other spondylosis with myelopathy, lumbar region: Secondary | ICD-10-CM | POA: Diagnosis not present

## 2021-06-24 DIAGNOSIS — M5416 Radiculopathy, lumbar region: Secondary | ICD-10-CM | POA: Diagnosis not present

## 2021-06-24 DIAGNOSIS — M25559 Pain in unspecified hip: Secondary | ICD-10-CM | POA: Diagnosis not present

## 2021-06-24 DIAGNOSIS — M48061 Spinal stenosis, lumbar region without neurogenic claudication: Secondary | ICD-10-CM | POA: Diagnosis not present

## 2021-06-24 DIAGNOSIS — G894 Chronic pain syndrome: Secondary | ICD-10-CM | POA: Diagnosis not present

## 2021-06-25 DIAGNOSIS — D485 Neoplasm of uncertain behavior of skin: Secondary | ICD-10-CM | POA: Diagnosis not present

## 2021-06-25 DIAGNOSIS — Z85828 Personal history of other malignant neoplasm of skin: Secondary | ICD-10-CM | POA: Diagnosis not present

## 2021-06-25 DIAGNOSIS — L570 Actinic keratosis: Secondary | ICD-10-CM | POA: Diagnosis not present

## 2021-06-25 DIAGNOSIS — D0439 Carcinoma in situ of skin of other parts of face: Secondary | ICD-10-CM | POA: Diagnosis not present

## 2021-06-25 DIAGNOSIS — D2339 Other benign neoplasm of skin of other parts of face: Secondary | ICD-10-CM | POA: Diagnosis not present

## 2021-06-25 DIAGNOSIS — D2261 Melanocytic nevi of right upper limb, including shoulder: Secondary | ICD-10-CM | POA: Diagnosis not present

## 2021-06-25 DIAGNOSIS — D2262 Melanocytic nevi of left upper limb, including shoulder: Secondary | ICD-10-CM | POA: Diagnosis not present

## 2021-06-25 DIAGNOSIS — D2272 Melanocytic nevi of left lower limb, including hip: Secondary | ICD-10-CM | POA: Diagnosis not present

## 2021-06-25 DIAGNOSIS — X32XXXA Exposure to sunlight, initial encounter: Secondary | ICD-10-CM | POA: Diagnosis not present

## 2021-06-30 DIAGNOSIS — M9903 Segmental and somatic dysfunction of lumbar region: Secondary | ICD-10-CM | POA: Diagnosis not present

## 2021-06-30 DIAGNOSIS — M5416 Radiculopathy, lumbar region: Secondary | ICD-10-CM | POA: Diagnosis not present

## 2021-06-30 DIAGNOSIS — M62830 Muscle spasm of back: Secondary | ICD-10-CM | POA: Diagnosis not present

## 2021-06-30 DIAGNOSIS — M5136 Other intervertebral disc degeneration, lumbar region: Secondary | ICD-10-CM | POA: Diagnosis not present

## 2021-07-02 DIAGNOSIS — Z8546 Personal history of malignant neoplasm of prostate: Secondary | ICD-10-CM | POA: Diagnosis not present

## 2021-07-02 DIAGNOSIS — R3912 Poor urinary stream: Secondary | ICD-10-CM | POA: Diagnosis not present

## 2021-07-02 DIAGNOSIS — N5201 Erectile dysfunction due to arterial insufficiency: Secondary | ICD-10-CM | POA: Diagnosis not present

## 2021-07-14 DIAGNOSIS — M5416 Radiculopathy, lumbar region: Secondary | ICD-10-CM | POA: Diagnosis not present

## 2021-07-14 DIAGNOSIS — M9903 Segmental and somatic dysfunction of lumbar region: Secondary | ICD-10-CM | POA: Diagnosis not present

## 2021-07-14 DIAGNOSIS — M5136 Other intervertebral disc degeneration, lumbar region: Secondary | ICD-10-CM | POA: Diagnosis not present

## 2021-07-14 DIAGNOSIS — M62830 Muscle spasm of back: Secondary | ICD-10-CM | POA: Diagnosis not present

## 2021-07-17 DIAGNOSIS — M1612 Unilateral primary osteoarthritis, left hip: Secondary | ICD-10-CM | POA: Diagnosis not present

## 2021-07-17 DIAGNOSIS — M25552 Pain in left hip: Secondary | ICD-10-CM | POA: Diagnosis not present

## 2021-07-21 ENCOUNTER — Other Ambulatory Visit: Payer: Self-pay | Admitting: Family Medicine

## 2021-07-21 DIAGNOSIS — Z86010 Personal history of colonic polyps: Secondary | ICD-10-CM | POA: Diagnosis not present

## 2021-07-21 DIAGNOSIS — R142 Eructation: Secondary | ICD-10-CM | POA: Diagnosis not present

## 2021-07-21 DIAGNOSIS — K640 First degree hemorrhoids: Secondary | ICD-10-CM | POA: Diagnosis not present

## 2021-07-21 DIAGNOSIS — R110 Nausea: Secondary | ICD-10-CM | POA: Diagnosis not present

## 2021-07-21 DIAGNOSIS — K8020 Calculus of gallbladder without cholecystitis without obstruction: Secondary | ICD-10-CM | POA: Diagnosis not present

## 2021-07-21 DIAGNOSIS — K2289 Other specified disease of esophagus: Secondary | ICD-10-CM | POA: Diagnosis not present

## 2021-07-21 DIAGNOSIS — K2970 Gastritis, unspecified, without bleeding: Secondary | ICD-10-CM | POA: Diagnosis not present

## 2021-07-21 DIAGNOSIS — K449 Diaphragmatic hernia without obstruction or gangrene: Secondary | ICD-10-CM | POA: Diagnosis not present

## 2021-07-21 DIAGNOSIS — K5730 Diverticulosis of large intestine without perforation or abscess without bleeding: Secondary | ICD-10-CM | POA: Diagnosis not present

## 2021-07-21 DIAGNOSIS — K2270 Barrett's esophagus without dysplasia: Secondary | ICD-10-CM | POA: Diagnosis not present

## 2021-07-21 LAB — HM COLONOSCOPY

## 2021-07-28 DIAGNOSIS — M5416 Radiculopathy, lumbar region: Secondary | ICD-10-CM | POA: Diagnosis not present

## 2021-07-28 DIAGNOSIS — M5136 Other intervertebral disc degeneration, lumbar region: Secondary | ICD-10-CM | POA: Diagnosis not present

## 2021-07-28 DIAGNOSIS — M62830 Muscle spasm of back: Secondary | ICD-10-CM | POA: Diagnosis not present

## 2021-07-28 DIAGNOSIS — M9903 Segmental and somatic dysfunction of lumbar region: Secondary | ICD-10-CM | POA: Diagnosis not present

## 2021-08-04 ENCOUNTER — Other Ambulatory Visit: Payer: Self-pay | Admitting: Surgery

## 2021-08-04 DIAGNOSIS — K8020 Calculus of gallbladder without cholecystitis without obstruction: Secondary | ICD-10-CM | POA: Diagnosis not present

## 2021-08-07 ENCOUNTER — Telehealth: Payer: Self-pay

## 2021-08-10 NOTE — Telephone Encounter (Signed)
I called and schedule the patient for a office visit with the provider for preoperative clearance.e  Julian Pineda,cma

## 2021-08-10 NOTE — Telephone Encounter (Signed)
He has not been seen in over a year.  He needs a visit for preoperative clearance.  Please contact him and get this scheduled.

## 2021-08-11 DIAGNOSIS — M5136 Other intervertebral disc degeneration, lumbar region: Secondary | ICD-10-CM | POA: Diagnosis not present

## 2021-08-11 DIAGNOSIS — M62830 Muscle spasm of back: Secondary | ICD-10-CM | POA: Diagnosis not present

## 2021-08-11 DIAGNOSIS — M5416 Radiculopathy, lumbar region: Secondary | ICD-10-CM | POA: Diagnosis not present

## 2021-08-11 DIAGNOSIS — M9903 Segmental and somatic dysfunction of lumbar region: Secondary | ICD-10-CM | POA: Diagnosis not present

## 2021-08-13 NOTE — Progress Notes (Signed)
Sent message, via epic in basket, requesting orders in epic from surgeon.  

## 2021-08-14 ENCOUNTER — Other Ambulatory Visit: Payer: Self-pay | Admitting: Surgery

## 2021-08-17 NOTE — Progress Notes (Signed)
DUE TO COVID-19 ONLY ONE VISITOR IS ALLOWED TO COME WITH YOU AND STAY IN THE WAITING ROOM ONLY DURING PRE OP AND PROCEDURE DAY OF SURGERY.  2 VISITOR  MAY VISIT WITH YOU AFTER SURGERY IN YOUR PRIVATE ROOM DURING VISITING HOURS ONLY!  YOU NEED TO HAVE A COVID 19 TEST ON_______ @_from  8am-3pm _____, THIS TEST MUST BE DONE BEFORE SURGERY,  Covid test is done at [place], [place] [address].  This is a drive thru.  No appt required. Please see map.                 Your procedure is scheduled on:    09/02/2021   Report to [facility] Main  Entrance   Report to admitting at   0600 AM     Call this number if you have problems the morning of surgery [phone]    REMEMBER: NO  SOLID FOOD CANDY OR GUM AFTER MIDNIGHT. CLEAR LIQUIDS UNTIL     0530am     . NOTHING BY MOUTH EXCEPT CLEAR LIQUIDS UNTIL  0530am   . PLEASE FINISH ENSURE DRINK PER SURGEON ORDER  WHICH NEEDS TO BE COMPLETED AT   0530am    .      CLEAR LIQUID DIET   Foods Allowed                                                                    Coffee and tea, regular and decaf                            Fruit ices (not with fruit pulp)                                      Iced Popsicles                                    Carbonated beverages, regular and diet                                    Cranberry, grape and apple juices Sports drinks like Gatorade Lightly seasoned clear broth or consume(fat free) Sugar, honey syrup ___________________________________________________________________      BRUSH YOUR TEETH MORNING OF SURGERY AND RINSE YOUR MOUTH OUT, NO CHEWING GUM CANDY OR MINTS.     Take these medicines the morning of surgery with A SIP OF WATER:  amlodpine, protonix   DO NOT TAKE ANY DIABETIC MEDICATIONS DAY OF YOUR SURGERY                               You may not have any metal on your body including hair pins and              piercings  Do not wear jewelry, make-up, lotions, powders or  perfumes, deodorant             Do not  wear nail polish on your fingernails.  Do not shave  48 hours prior to surgery.              Men may shave face and neck.   Do not bring valuables to the hospital. [place].  Contacts, dentures or bridgework may not be worn into surgery.  Leave suitcase in the car. After surgery it may be brought to your room.     Patients discharged the day of surgery will not be allowed to drive home. IF YOU ARE HAVING SURGERY AND GOING HOME THE SAME DAY, YOU MUST HAVE AN ADULT TO DRIVE YOU HOME AND BE WITH YOU FOR 24 HOURS. YOU MAY GO HOME BY TAXI OR UBER OR ORTHERWISE, BUT AN ADULT MUST ACCOMPANY YOU HOME AND STAY WITH YOU FOR 24 HOURS.  Name and phone number of your driver:  Special Instructions: N/A              Please read over the following fact sheets you were given: _____________________________________________________________________  [facility] - Preparing for Surgery Before surgery, you can play an important role.  Because skin is not sterile, your skin needs to be as free of germs as possible.  You can reduce the number of germs on your skin by washing with CHG (chlorahexidine gluconate) soap before surgery.  CHG is an antiseptic cleaner which kills germs and bonds with the skin to continue killing germs even after washing. Please DO NOT use if you have an allergy to CHG or antibacterial soaps.  If your skin becomes reddened/irritated stop using the CHG and inform your nurse when you arrive at Short Stay. Do not shave (including legs and underarms) for at least 48 hours prior to the first CHG shower.  You may shave your face/neck. Please follow these instructions carefully:  1.  Shower with CHG Soap the night before surgery and the  morning of Surgery.  2.  If you choose to wash your hair, wash your hair first as usual with your  normal  shampoo.  3.  After you shampoo, rinse your hair and body thoroughly  to remove the  shampoo.                           4.  Use CHG as you would any other liquid soap.  You can apply chg directly  to the skin and wash                       Gently with a scrungie or clean washcloth.  5.  Apply the CHG Soap to your body ONLY FROM THE NECK DOWN.   Do not use on face/ open                           Wound or open sores. Avoid contact with eyes, ears mouth and genitals (private parts).                       Wash face,  Genitals (private parts) with your normal soap.             6.  Wash thoroughly, paying special attention to the area where your surgery  will be performed.  7.  Thoroughly rinse your body with warm water  from the neck down.  8.  DO NOT shower/wash with your normal soap after using and rinsing off  the CHG Soap.                9.  Pat yourself dry with a clean towel.            10.  Wear clean pajamas.            11.  Place clean sheets on your bed the night of your first shower and do not  sleep with pets. Day of Surgery : Do not apply any lotions/deodorants the morning of surgery.  Please wear clean clothes to the hospital/surgery center.  FAILURE TO FOLLOW THESE INSTRUCTIONS MAY RESULT IN THE CANCELLATION OF YOUR SURGERY PATIENT SIGNATURE_________________________________  NURSE SIGNATURE__________________________________  ________________________________________________________________________

## 2021-08-17 NOTE — Progress Notes (Signed)
DUE TO COVID-19 ONLY ONE VISITOR IS ALLOWED TO COME WITH YOU AND STAY IN THE WAITING ROOM ONLY DURING PRE OP AND PROCEDURE DAY OF SURGERY.  2 VISITOR  MAY VISIT WITH YOU AFTER SURGERY IN YOUR PRIVATE ROOM DURING VISITING HOURS ONLY!  YOU NEED TO HAVE A COVID 19 TEST ON______AME@_  @_from  8am-3pm _____, THIS TEST MUST BE DONE BEFORE SURGERY,  Covid test is done at [place], [place] [address].  This is a drive thru.  No appt required. Please see map.                 Your procedure is scheduled on:    Report to [place]  Entrance   Report to admitting at AM     Call this number if you have problems the morning of surgery [phone]    REMEMBER: NO  SOLID FOOD CANDY OR GUM AFTER MIDNIGHT. CLEAR LIQUIDS UNTIL         . NOTHING BY MOUTH EXCEPT CLEAR LIQUIDS UNTIL    . PLEASE FINISH ENSURE DRINK PER SURGEON ORDER  WHICH NEEDS TO BE COMPLETED AT      .      CLEAR LIQUID DIET   Foods Allowed                                                                    Coffee and tea, regular and decaf                            Fruit ices (not with fruit pulp)                                      Iced Popsicles                                    Carbonated beverages, regular and diet                                    Cranberry, grape and apple juices Sports drinks like Gatorade Lightly seasoned clear broth or consume(fat free) Sugar, honey syrup ___________________________________________________________________      BRUSH YOUR TEETH MORNING OF SURGERY AND RINSE YOUR MOUTH OUT, NO CHEWING GUM CANDY OR MINTS.     Take these medicines the morning of surgery with A SIP OF WATER:   DO NOT TAKE ANY DIABETIC MEDICATIONS DAY OF YOUR SURGERY                               You may not have any metal on your body including hair pins and              piercings  Do not wear jewelry, make-up, lotions, powders or perfumes, deodorant             Do not wear nail polish  on your fingernails.  Do not shave  48  hours prior to surgery.              Men may shave face and neck.   Do not bring valuables to the hospital. [place].  Contacts, dentures or bridgework may not be worn into surgery.  Leave suitcase in the car. After surgery it may be brought to your room.     Patients discharged the day of surgery will not be allowed to drive home. IF YOU ARE HAVING SURGERY AND GOING HOME THE SAME DAY, YOU MUST HAVE AN ADULT TO DRIVE YOU HOME AND BE WITH YOU FOR 24 HOURS. YOU MAY GO HOME BY TAXI OR UBER OR ORTHERWISE, BUT AN ADULT MUST ACCOMPANY YOU HOME AND STAY WITH YOU FOR 24 HOURS.  Name and phone number of your driver:  Special Instructions: N/A              Please read over the following fact sheets you were given: _____________________________________________________________________  [facility] - Preparing for Surgery Before surgery, you can play an important role.  Because skin is not sterile, your skin needs to be as free of germs as possible.  You can reduce the number of germs on your skin by washing with CHG (chlorahexidine gluconate) soap before surgery.  CHG is an antiseptic cleaner which kills germs and bonds with the skin to continue killing germs even after washing. Please DO NOT use if you have an allergy to CHG or antibacterial soaps.  If your skin becomes reddened/irritated stop using the CHG and inform your nurse when you arrive at Short Stay. Do not shave (including legs and underarms) for at least 48 hours prior to the first CHG shower.  You may shave your face/neck. Please follow these instructions carefully:  1.  Shower with CHG Soap the night before surgery and the  morning of Surgery.  2.  If you choose to wash your hair, wash your hair first as usual with your  normal  shampoo.  3.  After you shampoo, rinse your hair and body thoroughly to remove the  shampoo.                           4.  Use  CHG as you would any other liquid soap.  You can apply chg directly  to the skin and wash                       Gently with a scrungie or clean washcloth.  5.  Apply the CHG Soap to your body ONLY FROM THE NECK DOWN.   Do not use on face/ open                           Wound or open sores. Avoid contact with eyes, ears mouth and genitals (private parts).                       Wash face,  Genitals (private parts) with your normal soap.             6.  Wash thoroughly, paying special attention to the area where your surgery  will be performed.  7.  Thoroughly rinse your body with warm water from the neck down.  8.  DO NOT shower/wash with your  normal soap after using and rinsing off  the CHG Soap.                9.  Pat yourself dry with a clean towel.            10.  Wear clean pajamas.            11.  Place clean sheets on your bed the night of your first shower and do not  sleep with pets. Day of Surgery : Do not apply any lotions/deodorants the morning of surgery.  Please wear clean clothes to the hospital/surgery center.  FAILURE TO FOLLOW THESE INSTRUCTIONS MAY RESULT IN THE CANCELLATION OF YOUR SURGERY PATIENT SIGNATURE_________________________________  NURSE SIGNATURE__________________________________  ________________________________________________________________________

## 2021-08-17 NOTE — Progress Notes (Addendum)
Anesthesia Review:  PCP:DR Tommi Rumps  Cardiologist : none  Chest x-ray : EKG :08/19/21  Echo : Stress test: Cardiac Cath :  Activity level: can do a flight of stairs without difficulty  Sleep Study/ CPAP : none  Fasting Blood Sugar :      / Checks Blood Sugar -- times a day:   Blood Thinner/ Instructions /Last Dose: ASA / Instructions/ Last Dose :   No covid test- ambulatory surgery

## 2021-08-19 ENCOUNTER — Encounter (HOSPITAL_COMMUNITY)
Admission: RE | Admit: 2021-08-19 | Discharge: 2021-08-19 | Disposition: A | Discharge status: Home / Self Care | Payer: PPO | Source: Ambulatory Visit | Attending: Surgery | Admitting: Surgery

## 2021-08-19 ENCOUNTER — Other Ambulatory Visit: Payer: Self-pay

## 2021-08-19 ENCOUNTER — Encounter (HOSPITAL_COMMUNITY): Payer: Self-pay

## 2021-08-19 DIAGNOSIS — Z01818 Encounter for other preprocedural examination: Secondary | ICD-10-CM | POA: Diagnosis not present

## 2021-08-19 LAB — BASIC METABOLIC PANEL
Anion gap: 8 (ref 5–15)
BUN: 22 mg/dL (ref 8–23)
CO2: 24 mmol/L (ref 22–32)
Calcium: 8.9 mg/dL (ref 8.9–10.3)
Chloride: 106 mmol/L (ref 98–111)
Creatinine, Ser: 0.95 mg/dL (ref 0.61–1.24)
GFR, Estimated: >60 mL/min (ref >60)
Glucose, Bld: 106 mg/dL — ABNORMAL HIGH (ref 70–99)
Potassium: 3.7 mmol/L (ref 3.5–5.1)
Sodium: 138 mmol/L (ref 135–145)

## 2021-08-19 LAB — CBC
HCT: 41.1 % (ref 39.0–52.0)
Hemoglobin: 13.8 g/dL (ref 13.0–17.0)
MCH: 33.9 pg (ref 26.0–34.0)
MCHC: 33.6 g/dL (ref 30.0–36.0)
MCV: 101 fL — ABNORMAL HIGH (ref 80.0–100.0)
Platelets: 211 10*3/uL (ref 150–400)
RBC: 4.07 MIL/uL — ABNORMAL LOW (ref 4.22–5.81)
RDW: 13.9 % (ref 11.5–15.5)
WBC: 4.6 10*3/uL (ref 4.0–10.5)
nRBC: 0 % (ref 0.0–0.2)

## 2021-08-21 ENCOUNTER — Encounter: Payer: Self-pay | Admitting: Family Medicine

## 2021-08-21 DIAGNOSIS — K2270 Barrett's esophagus without dysplasia: Secondary | ICD-10-CM | POA: Insufficient documentation

## 2021-08-25 DIAGNOSIS — M9903 Segmental and somatic dysfunction of lumbar region: Secondary | ICD-10-CM | POA: Diagnosis not present

## 2021-08-25 DIAGNOSIS — M5416 Radiculopathy, lumbar region: Secondary | ICD-10-CM | POA: Diagnosis not present

## 2021-08-25 DIAGNOSIS — M62830 Muscle spasm of back: Secondary | ICD-10-CM | POA: Diagnosis not present

## 2021-08-25 DIAGNOSIS — M5136 Other intervertebral disc degeneration, lumbar region: Secondary | ICD-10-CM | POA: Diagnosis not present

## 2021-08-30 ENCOUNTER — Encounter (HOSPITAL_COMMUNITY): Payer: Self-pay | Admitting: Surgery

## 2021-09-01 NOTE — H&P (Signed)
PROVIDER: Beverlee Nims, MD  MRN: [id number] DOB: [birth year] DATE OF ENCOUNTER: 08/04/2021 Subjective   Chief Complaint: Gallbladder    History of Present Illness: Julian Pineda is a 75 y.o. male who is seen today for symptomatic gallstones.  I saw him in late November last year with intractable nausea. He did not have any imaging yet. We do not get an ultrasound showing gallstones. Since that time he had to have work-up of hematuria and he has prostate seeds that are in place in the past. He has seen gastroenterology has had an upper and lower endoscopy which shows only some mild Barrett's esophagus. He also has a small hiatal hernia. His nausea has persisted and is daily. He also has some pain in the right upper quadrant and chest. He denies jaundice. He is going to be undergoing hip replacement in late November. He had an MRI of the abdomen that showed a hemangioma as well as gallstones.  Review of Systems: A complete review of systems was obtained from the patient. I have reviewed this information and discussed as appropriate with the patient. See HPI as well for other ROS.  ROS   Medical History: Past Medical History:  Diagnosis Date   Arthritis   Basal cell carcinoma (BCC)  On eyelid, s/p resection; followed by Dr. Carlis Abbott.   GERD (gastroesophageal reflux disease)   Hypertension, unspecified type   Prostate cancer (CMS-HCC)  S/p radioactive seed implantation/brachytherapy implant; followed by Dr. Junious Silk.   Patient Active Problem List  Diagnosis   Personal history of other malignant neoplasm of skin   Alcohol use   Basal cell carcinoma   Chronic fatigue   Elevated LDL cholesterol level   Hypertension   Low back pain   Lumbar radiculopathy   Nausea   Pain of left hip joint   Physical deconditioning   Rheumatoid arthritis (CMS-HCC)   S/P TKR (total knee replacement)   Vitamin D deficiency disease   History of prostate cancer   Personal history of other  malignant neoplasm of skin   Past Surgical History:  Procedure Laterality Date   COLONOSCOPY 10/13/2005  Adenomatous Polyp   COLONOSCOPY 11/09/2017  Adenomatous Polyps: CBF 11/2020   COLONOSCOPY 07/21/2021  Diverticulosis/Otherwise normal colon/PHx CP/No repeat due to age/TKT   EGD 07/21/2021  Barrett's Esophagus/Gastritis/Repeat 30yrs/TKT   REPLACEMENT TOTAL KNEE Left    Allergies  Allergen Reactions   Penicillin G Unknown   Current Outpatient Medications on File Prior to Visit  Medication Sig Dispense Refill   amLODIPine (NORVASC) 5 MG tablet   ERGOCALCIFEROL, VITAMIN D2, (VITAMIN D3 ORAL) Take by mouth.   folic acid (FOLVITE) 1 MG tablet Take 1 mg by mouth once daily.   losartan (COZAAR) 25 MG tablet Take 25 mg by mouth once daily.   meloxicam (MOBIC) 15 MG tablet meloxicam 15 mg tablet   methotrexate 25 mg/mL injection   metroNIDAZOLE (METROGEL) 0.75 % gel Apply topically 2 (two) times daily.   multivitamin tablet Take 1 tablet by mouth once daily.   pantoprazole (PROTONIX) 40 MG DR tablet Take 1 tablet (40 mg total) by mouth once daily 90 tablet 3   No current facility-administered medications on file prior to visit.   Family History  Problem Relation Age of Onset   No Known Problems Mother   No Known Problems Father   No Known Problems Sister   No Known Problems Brother   No Known Problems Maternal Aunt   No Known Problems Maternal Uncle   No  Known Problems Paternal Aunt   No Known Problems Paternal Uncle   No Known Problems Maternal Grandmother   No Known Problems Maternal Grandfather   No Known Problems Paternal Grandmother   No Known Problems Paternal Grandfather   Cancer Neg Hx   Clotting disorder Neg Hx   Diabetes Neg Hx   Heart disease Neg Hx   Melanoma Neg Hx   Skin cancer, non-melanoma Neg Hx    Social History   Tobacco Use  Smoking Status Never Smoker  Smokeless Tobacco Former Systems developer    Social History   Socioeconomic History   Marital  status: Married  Tobacco Use   Smoking status: Never Smoker   Smokeless tobacco: Former Counsellor Use: Never used  Substance and Sexual Activity   Alcohol use: Yes  Alcohol/week: 14.0 standard drinks  Types: 14 Shots of liquor per week   Drug use: No   Sexual activity: Defer   Objective:   Vitals:  08/04/21 1426  BP: 138/72  Pulse: 78  Temp: 36.7 C (98 F)  SpO2: 100%  Weight: 88.9 kg (196 lb)  Height: 177.8 cm (5\' 10" )   Body mass index is 28.12 kg/m.  Physical Exam   Today looks well on exam.  Abdomen is soft and nontender. There are no hernias. There is no jaundice. There is no hepatomegaly  Lungs clear  CV RRR  Neuro grossly intact  Labs, Imaging and Diagnostic Testing:  I reviewed his ultrasound and MRI as well as notes from his gastroenterologist  Assessment and Plan:  Diagnoses and all orders for this visit:  Symptomatic cholelithiasis    His symptoms of nausea do seem consistent now with symptomatic gallstones. We discussed ongoing conservative management versus proceeding with a laparoscopic cholecystectomy. He is eager to go ahead and have the surgery done before his hip replacement which is in late November which I feel is very reasonable given his profound nausea and inability to tolerate any foods. We discussed the surgical procedure in detail. We discussed the risk which includes but is not limited to bleeding, infection, injury to surrounding structures, the need to convert to an open procedure, bile duct injury, bile leak, the chance this may not resolve his nausea completely, cardiopulmonary issues, etc. He understands and surgery will be scheduled.

## 2021-09-02 ENCOUNTER — Ambulatory Visit (HOSPITAL_COMMUNITY): Payer: PPO | Admitting: Anesthesiology

## 2021-09-02 ENCOUNTER — Ambulatory Visit (HOSPITAL_COMMUNITY)
Admission: RE | Admit: 2021-09-02 | Discharge: 2021-09-02 | Disposition: A | Discharge status: Home / Self Care | Payer: PPO | Source: Ambulatory Visit | Attending: Surgery | Admitting: Surgery

## 2021-09-02 ENCOUNTER — Encounter (HOSPITAL_COMMUNITY)
Admission: RE | Disposition: A | Discharge status: Home / Self Care | Payer: Self-pay | Source: Ambulatory Visit | Attending: Surgery

## 2021-09-02 ENCOUNTER — Encounter (HOSPITAL_COMMUNITY): Payer: Self-pay | Admitting: Surgery

## 2021-09-02 DIAGNOSIS — K8010 Calculus of gallbladder with chronic cholecystitis without obstruction: Secondary | ICD-10-CM | POA: Diagnosis not present

## 2021-09-02 DIAGNOSIS — K2270 Barrett's esophagus without dysplasia: Secondary | ICD-10-CM | POA: Insufficient documentation

## 2021-09-02 DIAGNOSIS — I10 Essential (primary) hypertension: Secondary | ICD-10-CM | POA: Diagnosis not present

## 2021-09-02 DIAGNOSIS — Z8546 Personal history of malignant neoplasm of prostate: Secondary | ICD-10-CM | POA: Diagnosis not present

## 2021-09-02 DIAGNOSIS — K449 Diaphragmatic hernia without obstruction or gangrene: Secondary | ICD-10-CM | POA: Insufficient documentation

## 2021-09-02 DIAGNOSIS — K219 Gastro-esophageal reflux disease without esophagitis: Secondary | ICD-10-CM | POA: Diagnosis not present

## 2021-09-02 HISTORY — PX: CHOLECYSTECTOMY: SHX55

## 2021-09-02 HISTORY — DX: Rheumatoid arthritis, unspecified: M06.9

## 2021-09-02 LAB — CBC
HCT: 37 % — ABNORMAL LOW (ref 39.0–52.0)
Hemoglobin: 12.4 g/dL — ABNORMAL LOW (ref 13.0–17.0)
MCH: 34.2 pg — ABNORMAL HIGH (ref 26.0–34.0)
MCHC: 33.5 g/dL (ref 30.0–36.0)
MCV: 101.9 fL — ABNORMAL HIGH (ref 80.0–100.0)
Platelets: 190 10*3/uL (ref 150–400)
RBC: 3.63 MIL/uL — ABNORMAL LOW (ref 4.22–5.81)
RDW: 14 % (ref 11.5–15.5)
WBC: 4.2 10*3/uL (ref 4.0–10.5)
nRBC: 0 % (ref 0.0–0.2)

## 2021-09-02 SURGERY — LAPAROSCOPIC CHOLECYSTECTOMY
Anesthesia: General | Site: Abdomen

## 2021-09-02 MED ORDER — FENTANYL CITRATE (PF) 100 MCG/2ML IJ SOLN
INTRAMUSCULAR | Status: AC
  Filled 2021-09-02: qty 2

## 2021-09-02 MED ORDER — ACETAMINOPHEN 500 MG PO TABS
1000.0000 mg | ORAL_TABLET | ORAL | Status: AC
Start: 2021-09-02 — End: 2021-09-02
  Administered 2021-09-02: 1000 mg via ORAL
  Filled 2021-09-02: qty 2

## 2021-09-02 MED ORDER — ACETAMINOPHEN 325 MG PO TABS: 325.0000 mg | ORAL_TABLET | ORAL | Status: DC | PRN

## 2021-09-02 MED ORDER — PROMETHAZINE HCL 25 MG/ML IJ SOLN: 6.2500 mg | INTRAMUSCULAR | Status: DC | PRN

## 2021-09-02 MED ORDER — KETOROLAC TROMETHAMINE 15 MG/ML IJ SOLN
INTRAMUSCULAR | Status: DC | PRN
  Administered 2021-09-02: 30 mg via INTRAVENOUS

## 2021-09-02 MED ORDER — FENTANYL CITRATE PF 50 MCG/ML IJ SOSY: 25.0000 ug | PREFILLED_SYRINGE | INTRAMUSCULAR | Status: DC | PRN

## 2021-09-02 MED ORDER — TRAMADOL HCL 50 MG PO TABS: 50.0000 mg | ORAL_TABLET | Freq: Four times a day (QID) | ORAL | 0 refills | Status: DC | PRN

## 2021-09-02 MED ORDER — BUPIVACAINE HCL (PF) 0.5 % IJ SOLN
INTRAMUSCULAR | Status: AC
  Filled 2021-09-02: qty 30

## 2021-09-02 MED ORDER — LACTATED RINGERS IV SOLN: INTRAVENOUS | Status: DC

## 2021-09-02 MED ORDER — OXYCODONE HCL 5 MG PO TABS: 5.0000 mg | ORAL_TABLET | Freq: Once | ORAL | Status: DC | PRN

## 2021-09-02 MED ORDER — ACETAMINOPHEN 10 MG/ML IV SOLN: 1000.0000 mg | Freq: Once | INTRAVENOUS | Status: DC | PRN

## 2021-09-02 MED ORDER — KETOROLAC TROMETHAMINE 30 MG/ML IJ SOLN
INTRAMUSCULAR | Status: AC
  Filled 2021-09-02: qty 1

## 2021-09-02 MED ORDER — EPHEDRINE SULFATE-NACL 50-0.9 MG/10ML-% IV SOSY
PREFILLED_SYRINGE | INTRAVENOUS | Status: DC | PRN
  Administered 2021-09-02 (×2): 7.5 mg via INTRAVENOUS

## 2021-09-02 MED ORDER — ROCURONIUM BROMIDE 10 MG/ML (PF) SYRINGE
PREFILLED_SYRINGE | INTRAVENOUS | Status: AC
  Filled 2021-09-02: qty 10

## 2021-09-02 MED ORDER — ONDANSETRON HCL 4 MG/2ML IJ SOLN
INTRAMUSCULAR | Status: AC
  Filled 2021-09-02: qty 2

## 2021-09-02 MED ORDER — ONDANSETRON 4 MG PO TBDP: 4.0000 mg | ORAL_TABLET | Freq: Three times a day (TID) | ORAL | 0 refills | Status: AC | PRN

## 2021-09-02 MED ORDER — ONDANSETRON HCL 4 MG/2ML IJ SOLN
INTRAMUSCULAR | Status: DC | PRN
  Administered 2021-09-02: 4 mg via INTRAVENOUS

## 2021-09-02 MED ORDER — FENTANYL CITRATE (PF) 100 MCG/2ML IJ SOLN
INTRAMUSCULAR | Status: DC | PRN
  Administered 2021-09-02: 100 ug via INTRAVENOUS

## 2021-09-02 MED ORDER — CHLORHEXIDINE GLUCONATE CLOTH 2 % EX PADS: 6.0000 | MEDICATED_PAD | Freq: Once | CUTANEOUS | Status: DC

## 2021-09-02 MED ORDER — LIDOCAINE HCL (PF) 2 % IJ SOLN
INTRAMUSCULAR | Status: AC
  Filled 2021-09-02: qty 5

## 2021-09-02 MED ORDER — LACTATED RINGERS IR SOLN
Status: DC | PRN
  Administered 2021-09-02: 1000 mL

## 2021-09-02 MED ORDER — CIPROFLOXACIN IN D5W 400 MG/200ML IV SOLN
400.0000 mg | INTRAVENOUS | Status: AC
  Administered 2021-09-02: 400 mg via INTRAVENOUS
  Filled 2021-09-02: qty 200

## 2021-09-02 MED ORDER — MIDAZOLAM HCL 2 MG/2ML IJ SOLN
INTRAMUSCULAR | Status: AC
  Filled 2021-09-02: qty 2

## 2021-09-02 MED ORDER — EPHEDRINE 5 MG/ML INJ
INTRAVENOUS | Status: AC
  Filled 2021-09-02: qty 5

## 2021-09-02 MED ORDER — PROPOFOL 10 MG/ML IV BOLUS
INTRAVENOUS | Status: DC | PRN
  Administered 2021-09-02: 150 mg via INTRAVENOUS

## 2021-09-02 MED ORDER — SUGAMMADEX SODIUM 200 MG/2ML IV SOLN
INTRAVENOUS | Status: DC | PRN
  Administered 2021-09-02: 200 mg via INTRAVENOUS

## 2021-09-02 MED ORDER — ROCURONIUM BROMIDE 10 MG/ML (PF) SYRINGE
PREFILLED_SYRINGE | INTRAVENOUS | Status: DC | PRN
  Administered 2021-09-02: 60 mg via INTRAVENOUS

## 2021-09-02 MED ORDER — LIDOCAINE 2% (20 MG/ML) 5 ML SYRINGE
INTRAMUSCULAR | Status: DC | PRN
Start: 2021-09-02 — End: 2021-09-02
  Administered 2021-09-02: 40 mg via INTRAVENOUS

## 2021-09-02 MED ORDER — CHLORHEXIDINE GLUCONATE 0.12 % MT SOLN
15.0000 mL | Freq: Once | OROMUCOSAL | Status: AC
  Administered 2021-09-02: 15 mL via OROMUCOSAL

## 2021-09-02 MED ORDER — DEXAMETHASONE SODIUM PHOSPHATE 4 MG/ML IJ SOLN
INTRAMUSCULAR | Status: DC | PRN
  Administered 2021-09-02: 10 mg via INTRAVENOUS

## 2021-09-02 MED ORDER — BUPIVACAINE HCL (PF) 0.5 % IJ SOLN
INTRAMUSCULAR | Status: DC | PRN
  Administered 2021-09-02: 20 mL

## 2021-09-02 MED ORDER — ORAL CARE MOUTH RINSE: 15.0000 mL | Freq: Once | OROMUCOSAL | Status: AC

## 2021-09-02 MED ORDER — MIDAZOLAM HCL 5 MG/5ML IJ SOLN
INTRAMUSCULAR | Status: DC | PRN
Start: 2021-09-02 — End: 2021-09-02
  Administered 2021-09-02: 2 mg via INTRAVENOUS

## 2021-09-02 MED ORDER — OXYCODONE HCL 5 MG/5ML PO SOLN: 5.0000 mg | Freq: Once | ORAL | Status: DC | PRN

## 2021-09-02 MED ORDER — 0.9 % SODIUM CHLORIDE (POUR BTL) OPTIME
TOPICAL | Status: DC | PRN
  Administered 2021-09-02: 1000 mL

## 2021-09-02 MED ORDER — DEXAMETHASONE SODIUM PHOSPHATE 10 MG/ML IJ SOLN
INTRAMUSCULAR | Status: AC
  Filled 2021-09-02: qty 1

## 2021-09-02 MED ORDER — ACETAMINOPHEN 160 MG/5ML PO SOLN: 325.0000 mg | ORAL | Status: DC | PRN

## 2021-09-02 MED ORDER — AMISULPRIDE (ANTIEMETIC) 5 MG/2ML IV SOLN: 10.0000 mg | Freq: Once | INTRAVENOUS | Status: DC | PRN

## 2021-09-02 MED ORDER — PROPOFOL 10 MG/ML IV BOLUS
INTRAVENOUS | Status: AC
  Filled 2021-09-02: qty 20

## 2021-09-02 SURGICAL SUPPLY — 30 items
APPLIER CLIP 5 13 M/L LIGAMAX5 (MISCELLANEOUS) ×2
BAG COUNTER SPONGE SURGICOUNT (BAG) IMPLANT
CABLE HIGH FREQUENCY MONO STRZ (ELECTRODE) ×2 IMPLANT
CHLORAPREP W/TINT 26 (MISCELLANEOUS) ×2 IMPLANT
CLIP APPLIE 5 13 M/L LIGAMAX5 (MISCELLANEOUS) ×1 IMPLANT
COVER MAYO STAND STRL (DRAPES) IMPLANT
DECANTER SPIKE VIAL GLASS SM (MISCELLANEOUS) ×2 IMPLANT
DERMABOND ADVANCED (GAUZE/BANDAGES/DRESSINGS) ×1
DERMABOND ADVANCED .7 DNX12 (GAUZE/BANDAGES/DRESSINGS) ×1 IMPLANT
DRAPE C-ARM 42X120 X-RAY (DRAPES) IMPLANT
ELECT REM PT RETURN 15FT ADLT (MISCELLANEOUS) ×2 IMPLANT
GLOVE SURG ENC MOIS LTX SZ7.5 (GLOVE) ×2 IMPLANT
GOWN STRL REUS W/TWL XL LVL3 (GOWN DISPOSABLE) ×4 IMPLANT
HEMOSTAT SURGICEL 4X8 (HEMOSTASIS) IMPLANT
IRRIG SUCT STRYKERFLOW 2 WTIP (MISCELLANEOUS) ×2
IRRIGATION SUCT STRKRFLW 2 WTP (MISCELLANEOUS) ×1 IMPLANT
KIT BASIN OR (CUSTOM PROCEDURE TRAY) ×2 IMPLANT
KIT TURNOVER KIT A (KITS) IMPLANT
PENCIL SMOKE EVACUATOR (MISCELLANEOUS) IMPLANT
POUCH SPECIMEN RETRIEVAL 10MM (ENDOMECHANICALS) ×2 IMPLANT
SCISSORS LAP 5X35 DISP (ENDOMECHANICALS) ×2 IMPLANT
SET CHOLANGIOGRAPH MIX (MISCELLANEOUS) IMPLANT
SET TUBE SMOKE EVAC HIGH FLOW (TUBING) ×2 IMPLANT
SLEEVE XCEL OPT CAN 5 100 (ENDOMECHANICALS) ×4 IMPLANT
SUT MNCRL AB 4-0 PS2 18 (SUTURE) ×2 IMPLANT
TOWEL OR 17X26 10 PK STRL BLUE (TOWEL DISPOSABLE) ×2 IMPLANT
TOWEL OR NON WOVEN STRL DISP B (DISPOSABLE) ×2 IMPLANT
TRAY LAPAROSCOPIC (CUSTOM PROCEDURE TRAY) ×2 IMPLANT
TROCAR BLADELESS OPT 5 100 (ENDOMECHANICALS) ×2 IMPLANT
TROCAR XCEL BLUNT TIP 100MML (ENDOMECHANICALS) ×2 IMPLANT

## 2021-09-02 NOTE — Anesthesia Preprocedure Evaluation (Addendum)
Anesthesia Evaluation  Patient identified by MRN, date of birth, ID band Patient awake    Reviewed: Allergy & Precautions, NPO status , Patient's Chart, lab work & pertinent test results  Airway Mallampati: II  TM Distance: >3 FB Neck ROM: Full    Dental  (+) Teeth Intact, Dental Advisory Given   Pulmonary neg pulmonary ROS,    breath sounds clear to auscultation       Cardiovascular hypertension, Pt. on medications  Rhythm:Regular Rate:Normal     Neuro/Psych negative neurological ROS  negative psych ROS   GI/Hepatic Neg liver ROS, GERD  Medicated,  Endo/Other  negative endocrine ROS  Renal/GU negative Renal ROS     Musculoskeletal  (+) Arthritis ,   Abdominal Normal abdominal exam  (+)   Peds  Hematology negative hematology ROS (+)   Anesthesia Other Findings   Reproductive/Obstetrics                            Anesthesia Physical Anesthesia Plan  ASA: 2  Anesthesia Plan: General   Post-op Pain Management:    Induction: Intravenous  PONV Risk Score and Plan: 3 and Ondansetron, Dexamethasone, Treatment may vary due to age or medical condition and Midazolam  Airway Management Planned: Oral ETT  Additional Equipment: None  Intra-op Plan:   Post-operative Plan: Extubation in OR  Informed Consent: I have reviewed the patients History and Physical, chart, labs and discussed the procedure including the risks, benefits and alternatives for the proposed anesthesia with the patient or authorized representative who has indicated his/her understanding and acceptance.       Plan Discussed with: CRNA  Anesthesia Plan Comments:        Anesthesia Quick Evaluation

## 2021-09-02 NOTE — Op Note (Signed)
LAPAROSCOPIC CHOLECYSTECTOMY  Procedure Note  DOYE MONTILLA 09/02/2021   Pre-op Diagnosis: symptomatic gallstones     Post-op Diagnosis: same  Procedure(s): LAPAROSCOPIC CHOLECYSTECTOMY  Surgeon(s): Coralie Keens, MD  Anesthesia: General  Staff:  Circulator: Vernia Buff, RN Scrub Person: Lois Huxley Circulator Assistant: Cindee Salt, RN  Estimated Blood Loss: Minimal               Specimens: sent to path  Findings: The patient found to have a chronically inflamed thick-walled gallbladder with multiple stones  Procedure: Patient brought to operating identifies correct patient.  He was placed upon the operating table and general anesthesia was induced.  His abdomen was then prepped and draped in usual sterile fashion.  I made a small vertical incision below the umbilicus with a scalpel.  I then dissected down to the fascia which was opened with a scalpel.  Hemostat was then used to pass to the peritoneal cavity under direct vision.  0 Vicryl pursing suture was then placed around the fascial opening.  The [facility] port was placed to the opening and insufflation of the abdomen was begun.  I placed a 5 mm trocar in the patient's epigastrium and 2 more in the right upper quadrant all under direct vision.  The gallbladder was identified.  It was found to be thick-walled and chronically inflamed.  I grasped and elevated it.  Dissection was then carried out the base of the gallbladder which was quite friable.  Identified the cystic duct and cystic artery and achieved a critical window around both.  Clipped the artery twice proximally distally and transected it.  I then clipped the cystic duct 3 times proximally once distally and transected as well.  The gallbladder was then slowly dissected free from the liver bed with electrocautery.  Once it was freed from the liver bed was placed in an Endosac and removed the incision at the umbilicus.  We then again evaluate liver bed  hemostasis appeared to be achieved.  I tied off the 0 Vicryl the umbilicus closing the fascial defect.  I then irrigated the right upper quadrant with normal saline.  All ports were removed under direct vision the abdomen was deflated.  I placed 2 more 0 Vicryl sutures at the umbilicus fascial closure site.  All incisions anesthetized Marcaine and closed with 4-0 Monocryl sutures.  Dermabond was then applied.  The patient tolerated the procedure well.  All the counts were correct at the end of the procedure.  The patient was then extubated in the operating room and taken in stable just to the recovery room.          Coralie Keens   Date: 09/02/2021  Time: 9:12 AM

## 2021-09-02 NOTE — Discharge Instructions (Signed)
[facility] ______[facility] LAPAROSCOPIC SURGERY: POST OP INSTRUCTIONS Always review your discharge instruction sheet given to you by the facility where your surgery was performed. IF YOU HAVE DISABILITY OR FAMILY LEAVE FORMS, YOU MUST BRING THEM TO THE OFFICE FOR PROCESSING.   DO NOT GIVE THEM TO YOUR DOCTOR.  A prescription for pain medication may be given to you upon discharge.  Take your pain medication as prescribed, if needed.  If narcotic pain medicine is not needed, then you may take acetaminophen (Tylenol) or ibuprofen (Advil) as needed. Take your usually prescribed medications unless otherwise directed. If you need a refill on your pain medication, please contact your pharmacy.  They will contact our office to request authorization. Prescriptions will not be filled after 5pm or on week-ends. You should follow a light diet the first few days after arrival home, such as soup and crackers, etc.  Be sure to include lots of fluids daily. Most patients will experience some swelling and bruising in the area of the incisions.  Ice packs will help.  Swelling and bruising can take several days to resolve.  It is common to experience some constipation if taking pain medication after surgery.  Increasing fluid intake and taking a stool softener (such as Colace) will usually help or prevent this problem from occurring.  A mild laxative (Milk of Magnesia or Miralax) should be taken according to package instructions if there are no bowel movements after 48 hours. Unless discharge instructions indicate otherwise, you may remove your bandages 24-48 hours after surgery, and you may shower at that time.  You may have steri-strips (small skin tapes) in place directly over the incision.  These strips should be left on the skin for 7-10 days.  If your surgeon used skin glue on the incision, you may shower in 24 hours.  The glue will flake off over the next 2-3 weeks.  Any sutures or staples will be  removed at the office during your follow-up visit. ACTIVITIES:  You may resume regular (light) daily activities beginning the next day--such as daily self-care, walking, climbing stairs--gradually increasing activities as tolerated.  You may have sexual intercourse when it is comfortable.  Refrain from any heavy lifting or straining until approved by your doctor. You may drive when you are no longer taking prescription pain medication, you can comfortably wear a seatbelt, and you can safely maneuver your car and apply brakes. RETURN TO WORK:  __________________________________________________________ Dennis Bast should see your doctor in the office for a follow-up appointment approximately 2-3 weeks after your surgery.  Make sure that you call for this appointment within a day or two after you arrive home to insure a convenient appointment time. OTHER INSTRUCTIONS: OK TO SHOWER STARTING TOMORROW ICE PACK, TYLENOL, AND IBUPROFEN ALSO FOR PAIN NO LIFTING MORE THAN 15 TO 20 POUNDS FOR 2 WEEKS __________________________________________________________________________________________________________________________ __________________________________________________________________________________________________________________________ WHEN TO CALL YOUR DOCTOR: Fever over 101.0 Inability to urinate Continued bleeding from incision. Increased pain, redness, or drainage from the incision. Increasing abdominal pain  The clinic staff is available to answer your questions during regular business hours.  Please don't hesitate to call and ask to speak to one of the nurses for clinical concerns.  If you have a medical emergency, go to the nearest emergency room or call 911.  A surgeon from [facility] is always on call at the hospital. [address] [place], [place], [place]  [address] ? [address], [place],    [address] [phone] ?  [phone] ? FAX [phone] Web site:  [email or web address]

## 2021-09-02 NOTE — Anesthesia Postprocedure Evaluation (Signed)
Anesthesia Post Note  Patient: Julian Pineda  Procedure(s) Performed: LAPAROSCOPIC CHOLECYSTECTOMY (Abdomen)     Patient location during evaluation: PACU Anesthesia Type: General Level of consciousness: awake and alert Pain management: pain level controlled Vital Signs Assessment: post-procedure vital signs reviewed and stable Respiratory status: spontaneous breathing, nonlabored ventilation, respiratory function stable and patient connected to nasal cannula oxygen Cardiovascular status: blood pressure returned to baseline and stable Postop Assessment: no apparent nausea or vomiting Anesthetic complications: no   No notable events documented.  Last Vitals:  Vitals:   09/02/21 0930 09/02/21 0945  BP: 120/64 120/66  Pulse: 60 60  Resp: 16 15  Temp:    SpO2: 99% 99%    Last Pain:  Vitals:   09/02/21 0945  TempSrc:   PainSc: 0-No pain                 Effie Berkshire

## 2021-09-02 NOTE — Interval H&P Note (Signed)
History and Physical Interval Note:no change in H and P  09/02/2021 7:03 AM  Julian Pineda  has presented today for surgery, with the diagnosis of symptomatic gallstones.  The various methods of treatment have been discussed with the patient and family. After consideration of risks, benefits and other options for treatment, the patient has consented to  Procedure(s): LAPAROSCOPIC CHOLECYSTECTOMY (N/A) as a surgical intervention.  The patient's history has been reviewed, patient examined, no change in status, stable for surgery.  I have reviewed the patient's chart and labs.  Questions were answered to the patient's satisfaction.     Coralie Keens

## 2021-09-02 NOTE — Transfer of Care (Signed)
Immediate Anesthesia Transfer of Care Note  Patient: Julian Pineda  Procedure(s) Performed: LAPAROSCOPIC CHOLECYSTECTOMY (Abdomen)  Patient Location: PACU  Anesthesia Type:General  Level of Consciousness: awake, alert , oriented and patient cooperative  Airway & Oxygen Therapy: Patient Spontanous Breathing and Patient connected to face mask  Post-op Assessment: Report given to RN and Post -op Vital signs reviewed and stable  Post vital signs: Reviewed and stable  Last Vitals:  Vitals Value Taken Time  BP 131/65 09/02/21 0911  Temp    Pulse 64 09/02/21 0912  Resp 16 09/02/21 0912  SpO2 100 % 09/02/21 0912  Vitals shown include unvalidated device data.  Last Pain:  Vitals:   09/02/21 0650  TempSrc:   PainSc: 0-No pain         Complications: No notable events documented.

## 2021-09-02 NOTE — Anesthesia Procedure Notes (Signed)
Procedure Name: Intubation Date/Time: 09/02/2021 8:23 AM Performed by: Claudia Desanctis, CRNA Pre-anesthesia Checklist: Patient identified, Emergency Drugs available, Suction available and Patient being monitored Patient Re-evaluated:Patient Re-evaluated prior to induction Oxygen Delivery Method: Circle system utilized Preoxygenation: Pre-oxygenation with 100% oxygen Induction Type: IV induction Ventilation: Mask ventilation without difficulty and Oral airway inserted - appropriate to patient size Laryngoscope Size: 2 and Miller Grade View: Grade II Tube type: Oral Tube size: 7.5 mm Number of attempts: 1 Airway Equipment and Method: Stylet Placement Confirmation: ETT inserted through vocal cords under direct vision, positive ETCO2 and breath sounds checked- equal and bilateral Secured at: 21 cm Tube secured with: Tape Dental Injury: Teeth and Oropharynx as per pre-operative assessment

## 2021-09-03 ENCOUNTER — Encounter (HOSPITAL_COMMUNITY): Payer: Self-pay | Admitting: Surgery

## 2021-09-03 LAB — SURGICAL PATHOLOGY

## 2021-09-07 ENCOUNTER — Other Ambulatory Visit: Payer: Self-pay

## 2021-09-07 ENCOUNTER — Ambulatory Visit (INDEPENDENT_AMBULATORY_CARE_PROVIDER_SITE_OTHER): Payer: PPO | Admitting: Family Medicine

## 2021-09-07 ENCOUNTER — Encounter: Payer: Self-pay | Admitting: Family Medicine

## 2021-09-07 VITALS — BP 140/80 | HR 81 | Temp 99.6°F | Ht 70.0 in | Wt 196.4 lb

## 2021-09-07 DIAGNOSIS — Z01818 Encounter for other preprocedural examination: Secondary | ICD-10-CM

## 2021-09-07 LAB — POCT URINALYSIS DIPSTICK
Bilirubin, UA: NEGATIVE
Glucose, UA: NEGATIVE
Ketones, UA: 15
Leukocytes, UA: NEGATIVE
Nitrite, UA: NEGATIVE
Protein, UA: NEGATIVE
Spec Grav, UA: 1.02 (ref 1.010–1.025)
Urobilinogen, UA: 1 E.U./dL
pH, UA: 5.5 (ref 5.0–8.0)

## 2021-09-07 NOTE — Patient Instructions (Signed)
Nice to see you. We will get labs today and then I will fill out your paperwork.

## 2021-09-07 NOTE — Assessment & Plan Note (Signed)
The patient is low risk based on the [facility] perioperative risk score.  We will obtain lab work requested by orthopedics.  Once this returns his paperwork will be signed and faxed.

## 2021-09-07 NOTE — Progress Notes (Signed)
Tommi Rumps, MD Phone: [phone]  Julian Pineda is a 75 y.o. male who presents today for preop evaluation.  Patient presents for preoperative clearance for a left total hip replacement for spinal anesthesia.  He notes no chest pain or shortness of breath.  He is able to walk up 2 flights of stairs with no symptoms.  He notes no history of anesthesia issues.  No family history of anesthesia issues.  No history of diabetes or thyroid dysfunction.  His blood pressure typically runs 937-169 systolically.  He had an EKG several weeks ago prior to his cholecystectomy and the EKG was reassuring.  Social History   Tobacco Use  Smoking Status Never  Smokeless Tobacco Former   Types: Chew   Quit date: 06/06/2016    Current Outpatient Medications on File Prior to Visit  Medication Sig Dispense Refill   amLODipine (NORVASC) 5 MG tablet Take 1 tablet (5 mg total) by mouth daily. 90 tablet 3   Ascorbic Acid (VITAMIN C) 1000 MG tablet Take 1,000 mg by mouth daily.     cholecalciferol (VITAMIN D) 1000 units tablet Take 1,000 Units by mouth every morning.      folic acid (FOLVITE) 1 MG tablet TAKE 1 TABLET EVERY DAY 90 tablet 0   losartan (COZAAR) 100 MG tablet TAKE 1 TABLET BY MOUTH DAILY 90 tablet 1   meloxicam (MOBIC) 15 MG tablet TAKE 1 TABLET BY MOUTH ONCE DAILY WITH FOOD 30 tablet 2   methotrexate 50 MG/2ML injection INJECT 1ML WEEKLY (Patient taking differently: Inject 25 mg into the muscle every Wednesday.) 4 mL 6   metroNIDAZOLE (METROCREAM) 0.75 % cream Apply 1 application topically daily.      ondansetron (ZOFRAN ODT) 4 MG disintegrating tablet Take 1 tablet (4 mg total) by mouth every 8 (eight) hours as needed for nausea or vomiting. 20 tablet 0   pantoprazole (PROTONIX) 40 MG tablet Take 40 mg by mouth daily.     traMADol (ULTRAM) 50 MG tablet Take 1-2 tablets (50-100 mg total) by mouth every 6 (six) hours as needed for moderate pain or severe pain. 30 tablet 0   No current  facility-administered medications on file prior to visit.     ROS see history of present illness  Objective  Physical Exam Vitals:   09/07/21 1521  BP: 140/80  Pulse: 81  Temp: 99.6 F (37.6 C)  SpO2: 99%    BP Readings from Last 3 Encounters:  09/07/21 140/80  09/02/21 120/66  08/19/21 (!) 144/69   Wt Readings from Last 3 Encounters:  09/07/21 196 lb 6.4 oz (89.1 kg)  08/19/21 192 lb (87.1 kg)  09/29/20 193 lb (87.5 kg)    Physical Exam Constitutional:      General: He is not in acute distress.    Appearance: He is not diaphoretic.  Cardiovascular:     Rate and Rhythm: Normal rate and regular rhythm.     Heart sounds: Normal heart sounds.  Pulmonary:     Effort: Pulmonary effort is normal.     Breath sounds: Normal breath sounds.  Abdominal:     General: Bowel sounds are normal. There is no distension.     Palpations: Abdomen is soft.     Tenderness: There is no abdominal tenderness. There is no guarding or rebound.     Comments: Some bruising over his abdomen, well-healing surgical scars  Musculoskeletal:     Right lower leg: No edema.     Left lower leg: No edema.  Skin:    General: Skin is warm and dry.  Neurological:     Mental Status: He is alert.     Assessment/Plan: Please see individual problem list.  Problem List Items Addressed This Visit     Preop examination - Primary    The patient is low risk based on the [facility] perioperative risk score.  We will obtain lab work requested by orthopedics.  Once this returns his paperwork will be signed and faxed.      Relevant Orders   Protime-INR   HgB A1c   POCT Urinalysis Dipstick     Return if symptoms worsen or fail to improve.  This visit occurred during the SARS-CoV-2 public health emergency.  Safety protocols were in place, including screening questions prior to the visit, additional usage of staff PPE, and extensive cleaning of exam room while observing appropriate contact time as indicated  for disinfecting solutions.    Tommi Rumps, MD [place]

## 2021-09-08 DIAGNOSIS — M5416 Radiculopathy, lumbar region: Secondary | ICD-10-CM | POA: Diagnosis not present

## 2021-09-08 DIAGNOSIS — M9903 Segmental and somatic dysfunction of lumbar region: Secondary | ICD-10-CM | POA: Diagnosis not present

## 2021-09-08 DIAGNOSIS — M5136 Other intervertebral disc degeneration, lumbar region: Secondary | ICD-10-CM | POA: Diagnosis not present

## 2021-09-08 DIAGNOSIS — M62830 Muscle spasm of back: Secondary | ICD-10-CM | POA: Diagnosis not present

## 2021-09-08 LAB — URINALYSIS, MICROSCOPIC ONLY
RBC / HPF: NONE SEEN (ref 0–?)
WBC, UA: NONE SEEN (ref 0–?)

## 2021-09-08 LAB — PROTIME-INR
INR: 1
Prothrombin Time: 9.9 s (ref 9.0–11.5)

## 2021-09-08 LAB — HEMOGLOBIN A1C
Hgb A1c MFr Bld: 5 % of total Hgb (ref <5.7)
Mean Plasma Glucose: 97 mg/dL
eAG (mmol/L): 5.4 mmol/L

## 2021-09-15 NOTE — Patient Instructions (Signed)
DUE TO COVID-19 ONLY ONE VISITOR IS ALLOWED TO COME WITH YOU AND STAY IN THE WAITING ROOM ONLY DURING PRE OP AND PROCEDURE DAY OF SURGERY IF YOU ARE GOING HOME AFTER SURGERY. IF YOU ARE SPENDING THE NIGHT 2 PEOPLE MAY VISIT WITH YOU IN YOUR PRIVATE ROOM AFTER SURGERY UNTIL VISITING  HOURS ARE OVER AT 8:00 PM AND 1  VISITOR  MAY  SPEND THE NIGHT.  Marland Kitchen               [name]     Your procedure is scheduled on: 09/29/21   Report to [facility] Main  Entrance   Report to admitting at  8:45 AM     Call this number if you have problems the morning of surgery [phone]   No food after midnight.    You may have clear liquid until 8:30 AM.    At 8:00 AM drink pre surgery drink.   Nothing by mouth after 8:30 AM.    CLEAR LIQUID DIET   Foods Allowed                                                                     Foods Excluded  Coffee and tea, regular and decaf                             liquids that you cannot  Plain Jell-O any favor except red or purple                                           see through such as: Fruit ices (not with fruit pulp)                                     milk, soups, orange juice  Iced Popsicles                                    All solid food Carbonated beverages, regular and diet                                    Cranberry, grape and apple juices Sports drinks like Gatorade Lightly seasoned clear broth or consume(fat free) Sugar     BRUSH YOUR TEETH MORNING OF SURGERY AND RINSE YOUR MOUTH OUT, NO CHEWING GUM CANDY OR MINTS.     Take these medicines the morning of surgery with A SIP OF WATER: Amlodipine, Pantoprazole                                You may not have any metal on your body including               piercings  Do not wear jewelry, lotions, powders or deodorant  Men may shave face and neck.   Do not bring valuables to the hospital. [place].  Contacts, dentures or bridgework may not be worn into surgery.     Patients discharged the day of surgery will not be allowed to drive home.  IF YOU ARE HAVING SURGERY AND GOING HOME THE SAME DAY, YOU MUST HAVE AN ADULT TO DRIVE YOU HOME AND BE WITH YOU FOR 24 HOURS. YOU MAY GO HOME BY TAXI OR UBER OR ORTHERWISE, BUT AN ADULT MUST ACCOMPANY YOU HOME AND STAY WITH YOU FOR 24 HOURS.  Name and phone number of your driver:  Special Instructions: N/A              Please read over the following fact sheets you were given: _____________________________________________________________________             [facility] - Preparing for Surgery Before surgery, you can play an important role.  Because skin is not sterile, your skin needs to be as free of germs as possible.  You can reduce the number of germs on your skin by washing with CHG (chlorahexidine gluconate) soap before surgery.  CHG is an antiseptic cleaner which kills germs and bonds with the skin to continue killing germs even after washing. Please DO NOT use if you have an allergy to CHG or antibacterial soaps.  If your skin becomes reddened/irritated stop using the CHG and inform your nurse when you arrive at Short Stay.  You may shave your face/neck. Please follow these instructions carefully:  1.  Shower with CHG Soap the night before surgery and the  morning of Surgery.  2.  If you choose to wash your hair, wash your hair first as usual with your  normal  shampoo.  3.  After you shampoo, rinse your hair and body thoroughly to remove the  shampoo.                            4.  Use CHG as you would any other liquid soap.  You can apply chg directly  to the skin and wash                       Gently with a scrungie or clean washcloth.  5.  Apply the CHG Soap to your body ONLY FROM THE NECK DOWN.   Do not use on face/ open                           Wound or open sores. Avoid contact with eyes, ears mouth and genitals (private  parts).                       Wash face,  Genitals (private parts) with your normal soap.             6.  Wash thoroughly, paying special attention to the area where your surgery  will be performed.  7.  Thoroughly rinse your body with warm water from the neck down.  8.  DO NOT shower/wash with your normal soap after using and rinsing off  the CHG Soap.                9.  Pat yourself dry with a clean towel.  10.  Wear clean pajamas.            11.  Place clean sheets on your bed the night of your first shower and do not  sleep with pets. Day of Surgery : Do not apply any lotions/deodorants the morning of surgery.  Please wear clean clothes to the hospital/surgery center.  FAILURE TO FOLLOW THESE INSTRUCTIONS MAY RESULT IN THE CANCELLATION OF YOUR SURGERY PATIENT SIGNATURE_________________________________  NURSE SIGNATURE__________________________________  ________________________________________________________________________   Adam Phenix  An incentive spirometer is a tool that can help keep your lungs clear and active. This tool measures how well you are filling your lungs with each breath. Taking long deep breaths may help reverse or decrease the chance of developing breathing (pulmonary) problems (especially infection) following: A long period of time when you are unable to move or be active. BEFORE THE PROCEDURE  If the spirometer includes an indicator to show your best effort, your nurse or respiratory therapist will set it to a desired goal. If possible, sit up straight or lean slightly forward. Try not to slouch. Hold the incentive spirometer in an upright position. INSTRUCTIONS FOR USE  Sit on the edge of your bed if possible, or sit up as far as you can in bed or on a chair. Hold the incentive spirometer in an upright position. Breathe out normally. Place the mouthpiece in your mouth and seal your lips tightly around it. Breathe in slowly and as deeply as  possible, raising the piston or the ball toward the top of the column. Hold your breath for 3-5 seconds or for as long as possible. Allow the piston or ball to fall to the bottom of the column. Remove the mouthpiece from your mouth and breathe out normally. Rest for a few seconds and repeat Steps 1 through 7 at least 10 times every 1-2 hours when you are awake. Take your time and take a few normal breaths between deep breaths. The spirometer may include an indicator to show your best effort. Use the indicator as a goal to work toward during each repetition. After each set of 10 deep breaths, practice coughing to be sure your lungs are clear. If you have an incision (the cut made at the time of surgery), support your incision when coughing by placing a pillow or rolled up towels firmly against it. Once you are able to get out of bed, walk around indoors and cough well. You may stop using the incentive spirometer when instructed by your caregiver.  RISKS AND COMPLICATIONS Take your time so you do not get dizzy or light-headed. If you are in pain, you may need to take or ask for pain medication before doing incentive spirometry. It is harder to take a deep breath if you are having pain. AFTER USE Rest and breathe slowly and easily. It can be helpful to keep track of a log of your progress. Your caregiver can provide you with a simple table to help with this. If you are using the spirometer at home, follow these instructions: [place] IF:  You are having difficultly using the spirometer. You have trouble using the spirometer as often as instructed. Your pain medication is not giving enough relief while using the spirometer. You develop fever of 100.5 F (38.1 C) or higher. SEEK IMMEDIATE MEDICAL CARE IF:  You cough up bloody sputum that had not been present before. You develop fever of 102 F (38.9 C) or greater. You develop worsening pain at or near  the incision site. MAKE SURE YOU:   Understand these instructions. Will watch your condition. Will get help right away if you are not doing well or get worse. Document Released: 02/28/2007 Document Revised: 01/10/2012 Document Reviewed: 05/01/2007 [facility] Patient Information 2014 [place], [place].   ________________________________________________________________________

## 2021-09-16 ENCOUNTER — Other Ambulatory Visit: Payer: Self-pay

## 2021-09-16 ENCOUNTER — Encounter (HOSPITAL_COMMUNITY)
Admission: RE | Admit: 2021-09-16 | Discharge: 2021-09-16 | Disposition: A | Discharge status: Home / Self Care | Payer: PPO | Source: Ambulatory Visit | Attending: Orthopedic Surgery | Admitting: Orthopedic Surgery

## 2021-09-16 ENCOUNTER — Encounter (HOSPITAL_COMMUNITY): Payer: Self-pay

## 2021-09-16 VITALS — BP 137/70 | HR 74 | Temp 97.9°F | Resp 16 | Ht 70.0 in | Wt 194.5 lb

## 2021-09-16 DIAGNOSIS — Z01818 Encounter for other preprocedural examination: Secondary | ICD-10-CM

## 2021-09-16 DIAGNOSIS — Z01812 Encounter for preprocedural laboratory examination: Secondary | ICD-10-CM | POA: Insufficient documentation

## 2021-09-16 DIAGNOSIS — I10 Essential (primary) hypertension: Secondary | ICD-10-CM | POA: Insufficient documentation

## 2021-09-16 LAB — CBC
HCT: 42.2 % (ref 39.0–52.0)
Hemoglobin: 14 g/dL (ref 13.0–17.0)
MCH: 33.8 pg (ref 26.0–34.0)
MCHC: 33.2 g/dL (ref 30.0–36.0)
MCV: 101.9 fL — ABNORMAL HIGH (ref 80.0–100.0)
Platelets: 277 10*3/uL (ref 150–400)
RBC: 4.14 MIL/uL — ABNORMAL LOW (ref 4.22–5.81)
RDW: 13.3 % (ref 11.5–15.5)
WBC: 7.8 10*3/uL (ref 4.0–10.5)
nRBC: 0 % (ref 0.0–0.2)

## 2021-09-16 LAB — BASIC METABOLIC PANEL
Anion gap: 7 (ref 5–15)
BUN: 20 mg/dL (ref 8–23)
CO2: 26 mmol/L (ref 22–32)
Calcium: 9.2 mg/dL (ref 8.9–10.3)
Chloride: 107 mmol/L (ref 98–111)
Creatinine, Ser: 0.97 mg/dL (ref 0.61–1.24)
GFR, Estimated: >60 mL/min (ref >60)
Glucose, Bld: 74 mg/dL (ref 70–99)
Potassium: 3.7 mmol/L (ref 3.5–5.1)
Sodium: 140 mmol/L (ref 135–145)

## 2021-09-16 LAB — SURGICAL PCR SCREEN
MRSA, PCR: POSITIVE — AB
Staphylococcus aureus: POSITIVE — AB

## 2021-09-16 NOTE — Progress Notes (Signed)
COVID test- NA   PCP - Dr. Caryl Bis Cardiologist - Dr. Ashok Norris  Chest x-ray - no EKG - 08/19/21-epic Stress Test - no ECHO - 2019 Cardiac Cath - NA Pacemaker/ICD device last checked:NA  Sleep Study - no CPAP -   Fasting Blood Sugar - NA Checks Blood Sugar _____ times a day  Blood Thinner Instructions:NA Aspirin Instructions: Last Dose:  Anesthesia review: no  Patient denies shortness of breath, fever, cough and chest pain at PAT appointment Pt reports no SOB with any activities.  Patient verbalized understanding of instructions that were given to them at the PAT appointment. Patient was also instructed that they will need to review over the PAT instructions again at home before surgery. yes

## 2021-09-22 DIAGNOSIS — M5136 Other intervertebral disc degeneration, lumbar region: Secondary | ICD-10-CM | POA: Diagnosis not present

## 2021-09-22 DIAGNOSIS — M62830 Muscle spasm of back: Secondary | ICD-10-CM | POA: Diagnosis not present

## 2021-09-22 DIAGNOSIS — M9903 Segmental and somatic dysfunction of lumbar region: Secondary | ICD-10-CM | POA: Diagnosis not present

## 2021-09-22 DIAGNOSIS — M5416 Radiculopathy, lumbar region: Secondary | ICD-10-CM | POA: Diagnosis not present

## 2021-09-28 NOTE — H&P (Signed)
TOTAL HIP ADMISSION H&P  Patient is admitted for left total hip arthroplasty.  Subjective:  Chief Complaint: left hip pain  HPI: Julian Pineda, 75 y.o. male, has a history of pain and functional disability in the left hip(s) due to arthritis and patient has failed non-surgical conservative treatments for greater than 12 weeks to include NSAID's and/or analgesics, corticosteriod injections, and activity modification.  Onset of symptoms was gradual starting 2 years ago with gradually worsening course since that time.The patient noted no past surgery on the left hip(s).  Patient currently rates pain in the left hip at 8 out of 10 with activity. Patient has worsening of pain with activity and weight bearing, pain that interfers with activities of daily living, and pain with passive range of motion. Patient has evidence of joint space narrowing by imaging studies. This condition presents safety issues increasing the risk of falls. .  There is no current active infection.  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 Jackalyn Lombard, [place]   Nocturia    OA (osteoarthritis)    Prostate cancer [facility]) urologist-  dr eskidge/  oncologist- dr [name]   dx 04/ 2018-- Stage T1c, Gleason 3+3,  PSA 7.1,  vol 38.6cc   Rheumatoid arteritis ([place])    rheumotologist-   dr Elpidio Galea   Rosacea    Dr. Sharlett Iles   Syncope 01/27/2018   Vitamin D deficiency     Past Surgical History:  Procedure Laterality Date   ACHILLES TENDON SURGERY Right 1984   CHOLECYSTECTOMY N/A 09/02/2021   Procedure: LAPAROSCOPIC CHOLECYSTECTOMY;  Surgeon: Coralie Keens, MD;  Location: [facility];  Service: General;  Laterality: N/A;   COLONOSCOPY  2008   COLONOSCOPY WITH PROPOFOL N/A 11/09/2017   Procedure: COLONOSCOPY WITH PROPOFOL;  Surgeon: Manya Silvas, MD;  Location: [facility] ENDOSCOPY;  Service: Endoscopy;  Laterality: N/A;   MOHS SURGERY  09-25-2014;  11-27-2013   [place]   09-25-2014 right lower eyelid /  11-27-2013  left lateral cheek   RADIOACTIVE SEED IMPLANT N/A 06/17/2017   Procedure: RADIOACTIVE SEED IMPLANT/BRACHYTHERAPY IMPLANT;  Surgeon: Festus Aloe, MD;  Location: [facility];  Service: Urology;  Laterality: N/A;   TONSILLECTOMY  child   TOTAL KNEE ARTHROPLASTY  12/04/2012   Procedure: TOTAL KNEE ARTHROPLASTY;  Surgeon: Vickey Huger, MD;  Location: [place];  Service: Orthopedics;  Laterality: Left;  left total knee arthroplasty   VARICOSE VEIN SURGERY  2000  left leg    No current facility-administered medications for this encounter.   Current Outpatient Medications  Medication Sig Dispense Refill Last Dose   amLODipine (NORVASC) 5 MG tablet Take 1 tablet (5 mg total) by mouth daily. 90 tablet 3    Ascorbic Acid (VITAMIN C) 1000 MG tablet Take 1,000 mg by mouth daily.      cholecalciferol (VITAMIN D) 1000 units tablet Take 1,000 Units by mouth every morning.       folic acid (FOLVITE) 1 MG tablet TAKE 1 TABLET EVERY DAY  90 tablet 0    losartan (COZAAR) 100 MG tablet TAKE 1 TABLET BY MOUTH DAILY 90 tablet 1    meloxicam (MOBIC) 15 MG tablet TAKE 1 TABLET BY MOUTH ONCE DAILY WITH FOOD 30 tablet 2    methotrexate 50 MG/2ML injection INJECT 1ML WEEKLY (Patient taking differently: Inject 25 mg into the muscle every Wednesday.) 4 mL 6    metroNIDAZOLE (METROCREAM) 0.75 % cream Apply 1 application topically daily.       pantoprazole (PROTONIX) 40 MG tablet Take 40 mg by mouth daily.      traMADol (ULTRAM) 50 MG tablet Take 1-2 tablets (50-100 mg total) by mouth every 6 (six) hours as needed for moderate pain or severe pain. 30 tablet 0    ondansetron (ZOFRAN ODT) 4 MG disintegrating tablet Take 1 tablet (4 mg total) by mouth every 8 (eight) hours as needed for nausea or vomiting. (Patient not taking: Reported on 09/11/2021) 20 tablet 0 Not Taking   Allergies  Allergen Reactions   Penicillins Other (See Comments)    Unknown childhood reaction     Social History   Tobacco Use   Smoking status: Never   Smokeless tobacco: Former    Types: Chew    Quit date: 06/06/2016  Substance Use Topics   Alcohol use: Yes    Alcohol/week: 14.0 standard drinks    Types: 14 Shots of liquor per week    Comment: one dirnk nightly    Family History  Problem Relation Age of Onset   Cancer Mother        Multiple Myeloma   Alcohol abuse Father    Arthritis Father    Prostate cancer Brother    Breast cancer Daughter    Hematuria Neg Hx    Sickle cell anemia Neg Hx    Kidney cancer Neg Hx    Bladder Cancer Neg Hx      Review of Systems  Constitutional:  Negative for chills and fever.  Respiratory:  Negative for cough and shortness of breath.   Cardiovascular:  Negative for chest pain.  Gastrointestinal:  Negative for nausea and vomiting.  Musculoskeletal:  Positive for arthralgias.    Objective:  Physical Exam Well nourished and well developed. General: Alert and oriented x3, cooperative and pleasant, no acute  distress. Head: normocephalic, atraumatic, neck supple. Eyes: EOMI.  Musculoskeletal: Left hip exam: Reproducible pain and stiffness recognized with hip flexion internal rotation just beyond neutral Slight external rotation contracture noted Active hip flexion of 110 degrees with external rotation Neurovascular intact distally  Calves soft and nontender. Motor function intact in LE. Strength 5/5 LE bilaterally. Neuro: Distal pulses 2+. Sensation to light touch intact in LE.  Vital signs in last 24 hours:    Labs:   Estimated body mass index is 27.91 kg/m as calculated from the following:   Height as of 09/16/21: '5\' 10"'  (1.778 m).   Weight as of 09/16/21: 88.2 kg.  Imaging Review Plain radiographs demonstrate severe degenerative joint disease of the left hip(s). The bone quality appears to be adequate for age and reported activity level.      Assessment/Plan:  End stage arthritis, left hip(s)  The patient history, physical examination, clinical judgement of the provider and imaging studies are consistent with end stage degenerative joint disease of the left hip(s) and total hip arthroplasty is deemed medically necessary. The treatment options including medical management, injection therapy, arthroscopy and arthroplasty were discussed at length. The risks and benefits of total hip arthroplasty were presented and reviewed. The risks due to aseptic loosening, infection, stiffness, dislocation/subluxation,  thromboembolic complications and other imponderables were discussed.  The patient acknowledged the explanation, agreed to proceed with the plan and consent was signed. Patient is being admitted for inpatient treatment for surgery, pain control, PT, OT, prophylactic antibiotics, VTE prophylaxis, progressive ambulation and ADL's and discharge planning.The patient is planning to be discharged  home  Therapy Plans: HEP Disposition: Home with wife Planned DVT Prophylaxis: aspirin  26m BID DME needed: none PCP: Dr. ETommi Rumps clearance received Rheumatologist: Dr. BAmil Amen clearance received TXA: IV Allergies: PCN - childhood Anesthesia Concerns: none BMI: 28.1 Last HgbA1c: Not diabetic  Other: - RA - on methotrexate - Hydrocodone, robaxin, celebrex   ACostella Hatcher PA-C Orthopedic Surgery [facility] [place] [phone]

## 2021-09-29 ENCOUNTER — Encounter (HOSPITAL_COMMUNITY)
Admission: RE | Disposition: A | Discharge status: Home / Self Care | Payer: Self-pay | Source: Ambulatory Visit | Attending: Orthopedic Surgery

## 2021-09-29 ENCOUNTER — Ambulatory Visit (HOSPITAL_COMMUNITY): Payer: PPO

## 2021-09-29 ENCOUNTER — Ambulatory Visit (HOSPITAL_COMMUNITY): Payer: PPO | Admitting: Anesthesiology

## 2021-09-29 ENCOUNTER — Encounter (HOSPITAL_COMMUNITY): Payer: Self-pay | Admitting: Orthopedic Surgery

## 2021-09-29 ENCOUNTER — Ambulatory Visit (HOSPITAL_COMMUNITY)
Admission: RE | Admit: 2021-09-29 | Discharge: 2021-09-29 | Disposition: A | Discharge status: Home / Self Care | Payer: PPO | Source: Ambulatory Visit | Attending: Orthopedic Surgery | Admitting: Orthopedic Surgery

## 2021-09-29 DIAGNOSIS — I10 Essential (primary) hypertension: Secondary | ICD-10-CM | POA: Diagnosis not present

## 2021-09-29 DIAGNOSIS — Z471 Aftercare following joint replacement surgery: Secondary | ICD-10-CM | POA: Diagnosis not present

## 2021-09-29 DIAGNOSIS — M069 Rheumatoid arthritis, unspecified: Secondary | ICD-10-CM | POA: Diagnosis not present

## 2021-09-29 DIAGNOSIS — M25752 Osteophyte, left hip: Secondary | ICD-10-CM | POA: Diagnosis not present

## 2021-09-29 DIAGNOSIS — Z8546 Personal history of malignant neoplasm of prostate: Secondary | ICD-10-CM | POA: Diagnosis not present

## 2021-09-29 DIAGNOSIS — N35919 Unspecified urethral stricture, male, unspecified site: Secondary | ICD-10-CM | POA: Diagnosis not present

## 2021-09-29 DIAGNOSIS — K219 Gastro-esophageal reflux disease without esophagitis: Secondary | ICD-10-CM | POA: Diagnosis not present

## 2021-09-29 DIAGNOSIS — M25552 Pain in left hip: Secondary | ICD-10-CM

## 2021-09-29 DIAGNOSIS — M1612 Unilateral primary osteoarthritis, left hip: Secondary | ICD-10-CM | POA: Diagnosis not present

## 2021-09-29 DIAGNOSIS — Z79899 Other long term (current) drug therapy: Secondary | ICD-10-CM | POA: Diagnosis not present

## 2021-09-29 DIAGNOSIS — Z791 Long term (current) use of non-steroidal anti-inflammatories (NSAID): Secondary | ICD-10-CM | POA: Insufficient documentation

## 2021-09-29 DIAGNOSIS — Z96649 Presence of unspecified artificial hip joint: Secondary | ICD-10-CM

## 2021-09-29 DIAGNOSIS — Z96642 Presence of left artificial hip joint: Secondary | ICD-10-CM | POA: Diagnosis not present

## 2021-09-29 HISTORY — PX: TOTAL HIP ARTHROPLASTY: SHX124

## 2021-09-29 LAB — TYPE AND SCREEN
ABO/RH(D): A POS
Antibody Screen: NEGATIVE

## 2021-09-29 SURGERY — ARTHROPLASTY, HIP, TOTAL, ANTERIOR APPROACH
Anesthesia: Spinal | Site: Hip | Laterality: Left

## 2021-09-29 MED ORDER — POLYETHYLENE GLYCOL 3350 17 G PO PACK: 17.0000 g | PACK | Freq: Two times a day (BID) | ORAL | 0 refills | Status: DC

## 2021-09-29 MED ORDER — METHOCARBAMOL 500 MG PO TABS: 500.0000 mg | ORAL_TABLET | Freq: Four times a day (QID) | ORAL | 0 refills | Status: DC | PRN

## 2021-09-29 MED ORDER — TRANEXAMIC ACID-NACL 1000-0.7 MG/100ML-% IV SOLN
1000.0000 mg | Freq: Once | INTRAVENOUS | Status: AC
  Administered 2021-09-29: 1000 mg via INTRAVENOUS

## 2021-09-29 MED ORDER — SODIUM CHLORIDE 0.9 % IR SOLN
Status: DC | PRN
  Administered 2021-09-29: 1000 mL

## 2021-09-29 MED ORDER — HYDROCODONE-ACETAMINOPHEN 7.5-325 MG PO TABS: 1.0000 | ORAL_TABLET | ORAL | Status: DC | PRN

## 2021-09-29 MED ORDER — LACTATED RINGERS IV BOLUS
250.0000 mL | Freq: Once | INTRAVENOUS | Status: AC
  Administered 2021-09-29: 250 mL via INTRAVENOUS

## 2021-09-29 MED ORDER — LIDOCAINE HCL (PF) 2 % IJ SOLN
INTRAMUSCULAR | Status: AC
  Filled 2021-09-29: qty 5

## 2021-09-29 MED ORDER — PROPOFOL 500 MG/50ML IV EMUL
INTRAVENOUS | Status: DC | PRN
  Administered 2021-09-29: 125 ug/kg/min via INTRAVENOUS

## 2021-09-29 MED ORDER — EPHEDRINE 5 MG/ML INJ
INTRAVENOUS | Status: AC
  Filled 2021-09-29: qty 5

## 2021-09-29 MED ORDER — PROPOFOL 1000 MG/100ML IV EMUL
INTRAVENOUS | Status: AC
  Filled 2021-09-29: qty 100

## 2021-09-29 MED ORDER — METHOCARBAMOL 500 MG IVPB - SIMPLE MED: 500.0000 mg | Freq: Four times a day (QID) | INTRAVENOUS | Status: DC | PRN

## 2021-09-29 MED ORDER — MIDAZOLAM HCL 5 MG/5ML IJ SOLN
INTRAMUSCULAR | Status: DC | PRN
  Administered 2021-09-29: 2 mg via INTRAVENOUS

## 2021-09-29 MED ORDER — LACTATED RINGERS IV SOLN: INTRAVENOUS | Status: DC

## 2021-09-29 MED ORDER — VANCOMYCIN HCL 1000 MG IV SOLR
INTRAVENOUS | Status: DC | PRN
  Administered 2021-09-29: 1000 mg via INTRAVENOUS

## 2021-09-29 MED ORDER — ACETAMINOPHEN 500 MG PO TABS
1000.0000 mg | ORAL_TABLET | Freq: Once | ORAL | Status: AC
  Administered 2021-09-29: 1000 mg via ORAL

## 2021-09-29 MED ORDER — BUPIVACAINE IN DEXTROSE 0.75-8.25 % IT SOLN
INTRATHECAL | Status: DC | PRN
  Administered 2021-09-29: 1.8 mL via INTRATHECAL

## 2021-09-29 MED ORDER — FENTANYL CITRATE PF 50 MCG/ML IJ SOSY: 25.0000 ug | PREFILLED_SYRINGE | INTRAMUSCULAR | Status: DC | PRN

## 2021-09-29 MED ORDER — VANCOMYCIN HCL 1000 MG IV SOLR: 1000.0000 mg | Freq: Once | INTRAVENOUS | Status: DC

## 2021-09-29 MED ORDER — VANCOMYCIN HCL IN DEXTROSE 1-5 GM/200ML-% IV SOLN
INTRAVENOUS | Status: AC
  Filled 2021-09-29: qty 200

## 2021-09-29 MED ORDER — HYDROCODONE-ACETAMINOPHEN 5-325 MG PO TABS: 1.0000 | ORAL_TABLET | Freq: Four times a day (QID) | ORAL | 0 refills | Status: DC | PRN

## 2021-09-29 MED ORDER — DEXAMETHASONE SODIUM PHOSPHATE 10 MG/ML IJ SOLN
INTRAMUSCULAR | Status: AC
  Filled 2021-09-29: qty 1

## 2021-09-29 MED ORDER — CHLORHEXIDINE GLUCONATE 0.12 % MT SOLN
15.0000 mL | Freq: Once | OROMUCOSAL | Status: AC
  Administered 2021-09-29: 15 mL via OROMUCOSAL

## 2021-09-29 MED ORDER — CEFAZOLIN SODIUM-DEXTROSE 2-4 GM/100ML-% IV SOLN: 2.0000 g | Freq: Four times a day (QID) | INTRAVENOUS | Status: DC

## 2021-09-29 MED ORDER — MIDAZOLAM HCL 2 MG/2ML IJ SOLN
INTRAMUSCULAR | Status: AC
  Filled 2021-09-29: qty 2

## 2021-09-29 MED ORDER — VANCOMYCIN HCL IN DEXTROSE 1-5 GM/200ML-% IV SOLN
1000.0000 mg | INTRAVENOUS | Status: AC
  Administered 2021-09-29: 1000 mg via INTRAVENOUS

## 2021-09-29 MED ORDER — ONDANSETRON HCL 4 MG/2ML IJ SOLN
INTRAMUSCULAR | Status: AC
  Filled 2021-09-29: qty 2

## 2021-09-29 MED ORDER — STERILE WATER FOR IRRIGATION IR SOLN
Status: DC | PRN
  Administered 2021-09-29: 2000 mL

## 2021-09-29 MED ORDER — TRANEXAMIC ACID-NACL 1000-0.7 MG/100ML-% IV SOLN
INTRAVENOUS | Status: AC
  Filled 2021-09-29: qty 100

## 2021-09-29 MED ORDER — FENTANYL CITRATE (PF) 100 MCG/2ML IJ SOLN
INTRAMUSCULAR | Status: DC | PRN
  Administered 2021-09-29 (×2): 50 ug via INTRAVENOUS

## 2021-09-29 MED ORDER — DOCUSATE SODIUM 100 MG PO CAPS: 100.0000 mg | ORAL_CAPSULE | Freq: Two times a day (BID) | ORAL | Status: DC

## 2021-09-29 MED ORDER — PHENYLEPHRINE HCL-NACL 20-0.9 MG/250ML-% IV SOLN
INTRAVENOUS | Status: DC | PRN
  Administered 2021-09-29: 40 ug/min via INTRAVENOUS

## 2021-09-29 MED ORDER — ONDANSETRON HCL 4 MG/2ML IJ SOLN: 4.0000 mg | Freq: Once | INTRAMUSCULAR | Status: DC | PRN

## 2021-09-29 MED ORDER — TRANEXAMIC ACID-NACL 1000-0.7 MG/100ML-% IV SOLN
1000.0000 mg | INTRAVENOUS | Status: AC
  Administered 2021-09-29: 1000 mg via INTRAVENOUS

## 2021-09-29 MED ORDER — PROPOFOL 10 MG/ML IV BOLUS
INTRAVENOUS | Status: DC | PRN
  Administered 2021-09-29: 20 mg via INTRAVENOUS

## 2021-09-29 MED ORDER — POVIDONE-IODINE 10 % EX SWAB
2.0000 "application " | Freq: Once | CUTANEOUS | Status: AC
  Administered 2021-09-29: 2 via TOPICAL

## 2021-09-29 MED ORDER — LACTATED RINGERS IV BOLUS
500.0000 mL | Freq: Once | INTRAVENOUS | Status: AC
  Administered 2021-09-29: 500 mL via INTRAVENOUS

## 2021-09-29 MED ORDER — HYDROCODONE-ACETAMINOPHEN 5-325 MG PO TABS: 1.0000 | ORAL_TABLET | ORAL | Status: DC | PRN

## 2021-09-29 MED ORDER — CEFAZOLIN SODIUM-DEXTROSE 2-4 GM/100ML-% IV SOLN
INTRAVENOUS | Status: AC
  Filled 2021-09-29: qty 100

## 2021-09-29 MED ORDER — ACETAMINOPHEN 500 MG PO TABS
ORAL_TABLET | ORAL | Status: AC
  Filled 2021-09-29: qty 2

## 2021-09-29 MED ORDER — ONDANSETRON HCL 4 MG/2ML IJ SOLN
INTRAMUSCULAR | Status: DC | PRN
  Administered 2021-09-29: 4 mg via INTRAVENOUS

## 2021-09-29 MED ORDER — ASPIRIN 81 MG PO CHEW: 81.0000 mg | CHEWABLE_TABLET | Freq: Two times a day (BID) | ORAL | 0 refills | Status: AC

## 2021-09-29 MED ORDER — ORAL CARE MOUTH RINSE: 15.0000 mL | Freq: Once | OROMUCOSAL | Status: AC

## 2021-09-29 MED ORDER — CEFAZOLIN SODIUM-DEXTROSE 2-4 GM/100ML-% IV SOLN
2.0000 g | INTRAVENOUS | Status: AC
  Administered 2021-09-29: 2 g via INTRAVENOUS

## 2021-09-29 MED ORDER — EPHEDRINE SULFATE-NACL 50-0.9 MG/10ML-% IV SOSY
PREFILLED_SYRINGE | INTRAVENOUS | Status: DC | PRN
  Administered 2021-09-29 (×3): 5 mg via INTRAVENOUS

## 2021-09-29 MED ORDER — METHOCARBAMOL 500 MG PO TABS: 500.0000 mg | ORAL_TABLET | Freq: Four times a day (QID) | ORAL | Status: DC | PRN

## 2021-09-29 MED ORDER — FENTANYL CITRATE (PF) 100 MCG/2ML IJ SOLN
INTRAMUSCULAR | Status: AC
  Filled 2021-09-29: qty 2

## 2021-09-29 MED ORDER — DEXAMETHASONE SODIUM PHOSPHATE 10 MG/ML IJ SOLN
8.0000 mg | Freq: Once | INTRAMUSCULAR | Status: AC
  Administered 2021-09-29: 8 mg via INTRAVENOUS

## 2021-09-29 MED ORDER — LIDOCAINE 2% (20 MG/ML) 5 ML SYRINGE
INTRAMUSCULAR | Status: DC | PRN
  Administered 2021-09-29: 50 mg via INTRAVENOUS

## 2021-09-29 SURGICAL SUPPLY — 43 items
BAG COUNTER SPONGE SURGICOUNT (BAG) ×2 IMPLANT
BAG DECANTER FOR FLEXI CONT (MISCELLANEOUS) IMPLANT
BAG SURGICOUNT SPONGE COUNTING (BAG) ×1
BAG ZIPLOCK 12X15 (MISCELLANEOUS) IMPLANT
BLADE SAG 18X100X1.27 (BLADE) ×3 IMPLANT
CATH FOLEY 2WAY 5CC 16FR (CATHETERS) ×2
CATH URTH STD 16FR FL 2W DRN (CATHETERS) ×1 IMPLANT
COVER PERINEAL POST (MISCELLANEOUS) ×3 IMPLANT
COVER SURGICAL LIGHT HANDLE (MISCELLANEOUS) ×3 IMPLANT
CUP ACET PINNACLE SECTR 56MM (Hips) ×1 IMPLANT
DERMABOND ADVANCED (GAUZE/BANDAGES/DRESSINGS) ×2
DERMABOND ADVANCED .7 DNX12 (GAUZE/BANDAGES/DRESSINGS) ×1 IMPLANT
DRAPE FOOT SWITCH (DRAPES) ×3 IMPLANT
DRAPE STERI IOBAN 125X83 (DRAPES) ×3 IMPLANT
DRAPE U-SHAPE 47X51 STRL (DRAPES) ×6 IMPLANT
DRESSING AQUACEL AG SP 3.5X10 (GAUZE/BANDAGES/DRESSINGS) ×1 IMPLANT
DRSG AQUACEL AG SP 3.5X10 (GAUZE/BANDAGES/DRESSINGS) ×3
DURAPREP 26ML APPLICATOR (WOUND CARE) ×3 IMPLANT
ELECT REM PT RETURN 15FT ADLT (MISCELLANEOUS) ×3 IMPLANT
ELIMINATOR HOLE APEX DEPUY (Hips) ×3 IMPLANT
GLOVE SURG ENC MOIS LTX SZ7 (GLOVE) ×3 IMPLANT
GLOVE SURG UNDER LTX SZ6.5 (GLOVE) ×3 IMPLANT
GLOVE SURG UNDER POLY LF SZ7.5 (GLOVE) ×3 IMPLANT
GOWN STRL REUS W/TWL LRG LVL3 (GOWN DISPOSABLE) ×6 IMPLANT
HEAD CERAMIC DELTA 36 PLUS 1.5 (Hips) ×3 IMPLANT
HOLDER FOLEY CATH W/STRAP (MISCELLANEOUS) ×3 IMPLANT
KIT TURNOVER KIT A (KITS) IMPLANT
PACK ANTERIOR HIP CUSTOM (KITS) ×3 IMPLANT
PENCIL SMOKE EVACUATOR (MISCELLANEOUS) IMPLANT
PINNACLE ALTRX PLUS 4 N 36X56 (Hips) ×3 IMPLANT
PINNACLE SECTOR CUP 56MM (Hips) ×3 IMPLANT
SCREW 6.5MMX25MM (Screw) ×3 IMPLANT
STEM FEM ACTIS HIGH SZ8 (Stem) ×3 IMPLANT
SUT MNCRL AB 4-0 PS2 18 (SUTURE) ×3 IMPLANT
SUT STRATAFIX 0 PDS 27 VIOLET (SUTURE) ×3
SUT VIC AB 1 CT1 36 (SUTURE) ×9 IMPLANT
SUT VIC AB 2-0 CT1 27 (SUTURE) ×4
SUT VIC AB 2-0 CT1 TAPERPNT 27 (SUTURE) ×2 IMPLANT
SUTURE STRATFX 0 PDS 27 VIOLET (SUTURE) ×1 IMPLANT
TRAY FOLEY MTR SLVR 14FR STAT (SET/KITS/TRAYS/PACK) ×3 IMPLANT
TRAY FOLEY MTR SLVR 16FR STAT (SET/KITS/TRAYS/PACK) ×3 IMPLANT
TUBE SUCTION HIGH CAP CLEAR NV (SUCTIONS) ×3 IMPLANT
WATER STERILE IRR 1000ML POUR (IV SOLUTION) ×3 IMPLANT

## 2021-09-29 NOTE — Interval H&P Note (Signed)
History and Physical Interval Note:  09/29/2021 9:58 AM  Julian Pineda  has presented today for surgery, with the diagnosis of Left hip osteoarthritis.  The various methods of treatment have been discussed with the patient and family. After consideration of risks, benefits and other options for treatment, the patient has consented to  Procedure(s): TOTAL HIP ARTHROPLASTY ANTERIOR APPROACH (Left) as a surgical intervention.  The patient's history has been reviewed, patient examined, no change in status, stable for surgery.  I have reviewed the patient's chart and labs.  Questions were answered to the patient's satisfaction.     Mauri Pole

## 2021-09-29 NOTE — Anesthesia Preprocedure Evaluation (Addendum)
Anesthesia Evaluation  Patient identified by MRN, date of birth, ID band Patient awake    Reviewed: Allergy & Precautions, NPO status , Patient's Chart, lab work & pertinent test results  Airway Mallampati: II  TM Distance: <3 FB Neck ROM: Full    Dental  (+) Teeth Intact, Dental Advisory Given, Caps,    Pulmonary neg pulmonary ROS,    Pulmonary exam normal breath sounds clear to auscultation       Cardiovascular hypertension, Pt. on medications Normal cardiovascular exam Rhythm:Regular Rate:Normal     Neuro/Psych negative neurological ROS  negative psych ROS   GI/Hepatic Neg liver ROS, GERD  Medicated,  Endo/Other  negative endocrine ROS  Renal/GU negative Renal ROS   Prostate cancer     Musculoskeletal  (+) Arthritis , Osteoarthritis and Rheumatoid disorders,  Left hip osteoarthritis   Abdominal   Peds  Hematology negative hematology ROS (+) Plt 277k   Anesthesia Other Findings Day of surgery medications reviewed with the patient.  Reproductive/Obstetrics                            Anesthesia Physical Anesthesia Plan  ASA: 2  Anesthesia Plan: Spinal   Post-op Pain Management: Regional block and Tylenol PO (pre-op)   Induction: Intravenous  PONV Risk Score and Plan: 2 and Propofol infusion, Treatment may vary due to age or medical condition, Dexamethasone and Ondansetron  Airway Management Planned: Natural Airway and Nasal Cannula  Additional Equipment:   Intra-op Plan:   Post-operative Plan:   Informed Consent: I have reviewed the patients History and Physical, chart, labs and discussed the procedure including the risks, benefits and alternatives for the proposed anesthesia with the patient or authorized representative who has indicated his/her understanding and acceptance.     Dental advisory given  Plan Discussed with: CRNA, Anesthesiologist and Surgeon  Anesthesia  Plan Comments:         Anesthesia Quick Evaluation

## 2021-09-29 NOTE — OR Nursing (Signed)
29Nov. 2022 Attempted foley insertion, resistance reached, stopped insertion. Will have Urologist insert foley at end of procedure. Possibly in PACU.

## 2021-09-29 NOTE — Discharge Instructions (Addendum)

## 2021-09-29 NOTE — Anesthesia Procedure Notes (Signed)
Spinal  Patient location during procedure: OR Start time: 09/29/2021 11:10 AM End time: 09/29/2021 11:14 AM Reason for block: surgical anesthesia Staffing Performed: anesthesiologist  Anesthesiologist: Catalina Gravel, MD Preanesthetic Checklist Completed: patient identified, IV checked, risks and benefits discussed, surgical consent, monitors and equipment checked, pre-op evaluation and timeout performed Spinal Block Patient position: sitting Prep: DuraPrep and site prepped and draped Patient monitoring: continuous pulse ox and blood pressure Approach: midline Location: L3-4 Injection technique: single-shot Needle Needle type: Pencan  Needle gauge: 24 G Assessment Events: CSF return Additional Notes Functioning IV was confirmed and monitors were applied. Sterile prep and drape, including hand hygiene, mask and sterile gloves were used. The patient was positioned and the spine was prepped. The skin was anesthetized with lidocaine.  Free flow of clear CSF was obtained prior to injecting local anesthetic into the CSF.  The spinal needle aspirated freely following injection.  The needle was carefully withdrawn.  The patient tolerated the procedure well. Consent was obtained prior to procedure with all questions answered and concerns addressed. Risks including but not limited to bleeding, infection, nerve damage, paralysis, failed block, inadequate analgesia, allergic reaction, high spinal, itching and headache were discussed and the patient wished to proceed.   Hoy Morn, MD

## 2021-09-29 NOTE — Transfer of Care (Signed)
Immediate Anesthesia Transfer of Care Note  Patient: Julian Pineda  Procedure(s) Performed: TOTAL HIP ARTHROPLASTY ANTERIOR APPROACH (Left: Hip)  Patient Location: PACU  Anesthesia Type:Spinal  Level of Consciousness: awake and patient cooperative  Airway & Oxygen Therapy: Patient Spontanous Breathing and Patient connected to face mask  Post-op Assessment: Report given to RN and Post -op Vital signs reviewed and stable  Post vital signs: Reviewed and stable  Last Vitals:  Vitals Value Taken Time  BP 87/58 09/29/21 1300  Temp    Pulse 68 09/29/21 1302  Resp 13 09/29/21 1301  SpO2 100 % 09/29/21 1302  Vitals shown include unvalidated device data.  Last Pain:  Vitals:   09/29/21 0947  TempSrc:   PainSc: 0-No pain      Patients Stated Pain Goal: 4 (16/10/96 0454)  Complications: No notable events documented.

## 2021-09-29 NOTE — Care Plan (Signed)
Ortho Bundle Case Management Note  Patient Details  Name: Julian Pineda MRN: [id number] Date of Birth: January 08, 1946  L THA on 09-29-21 DCP:  Home with wife DME:  No needs, has a RW PT:  HEP                   DME Arranged:  N/A DME Agency:  NA  HH Arranged:  NA HH Agency:  NA  Additional Comments: Please contact me with any questions of if this plan should need to change.  Marianne Sofia, RN,CCM [facility]  [phone] 09/29/2021, 11:03 AM

## 2021-09-29 NOTE — Anesthesia Postprocedure Evaluation (Signed)
Anesthesia Post Note  Patient: Julian Pineda  Procedure(s) Performed: TOTAL HIP ARTHROPLASTY ANTERIOR APPROACH (Left: Hip)     Patient location during evaluation: PACU Anesthesia Type: Spinal Level of consciousness: oriented, awake and alert and awake Pain management: pain level controlled Vital Signs Assessment: post-procedure vital signs reviewed and stable Respiratory status: spontaneous breathing, respiratory function stable, patient connected to nasal cannula oxygen and nonlabored ventilation Cardiovascular status: blood pressure returned to baseline and stable Postop Assessment: no headache, no backache, no apparent nausea or vomiting, patient able to bend at knees and spinal receding Anesthetic complications: no   No notable events documented.  Last Vitals:  Vitals:   09/29/21 1530 09/29/21 1636  BP: 128/70 124/77  Pulse: 70 77  Resp:  16  Temp:    SpO2: 98% 99%    Last Pain:  Vitals:   09/29/21 1636  TempSrc:   PainSc: 0-No pain                 Catalina Gravel

## 2021-09-29 NOTE — Anesthesia Procedure Notes (Signed)
Procedure Name: MAC Date/Time: 09/29/2021 11:20 AM Performed by: Claudia Desanctis, CRNA Pre-anesthesia Checklist: Patient identified, Emergency Drugs available, Suction available and Patient being monitored Patient Re-evaluated:Patient Re-evaluated prior to induction Oxygen Delivery Method: Simple face mask

## 2021-09-29 NOTE — Op Note (Signed)
NAME:  Julian VILARDI.: [id number]      MEDICAL RECORD NO.: [id number]      FACILITY:  [facility]      PHYSICIAN:  Mauri Pole  DATE OF BIRTH:  19-Oct-1946     DATE OF PROCEDURE:  09/29/2021                                 OPERATIVE REPORT         PREOPERATIVE DIAGNOSIS: Left  hip osteoarthritis.      POSTOPERATIVE DIAGNOSIS:  Left hip osteoarthritis.      PROCEDURE:  Left total hip replacement through an anterior approach   utilizing DePuy THR system, component size 56 mm pinnacle cup, a size 36+4 neutral   Altrex liner, a size 8 Hi Actis stem with a 36+1.5 delta ceramic   ball.      SURGEON:  Pietro Cassis. Alvan Dame, M.D.      ASSISTANT:  Costella Hatcher, PA-C     ANESTHESIA:  Spinal.      SPECIMENS:  None.      COMPLICATIONS:  None.      BLOOD LOSS:  400 cc     DRAINS:  None.      INDICATION OF THE PROCEDURE:  Julian Pineda is a 75 y.o. male who had   presented to office for evaluation of left hip pain.  Radiographs revealed   progressive degenerative changes with bone-on-bone   articulation of the  hip joint, including subchondral cystic changes and osteophytes.  The patient had painful limited range of   motion significantly affecting their overall quality of life and function.  The patient was failing to    respond to conservative measures including medications and/or injections and activity modification and at this point was ready   to proceed with more definitive measures.  Consent was obtained for   benefit of pain relief.  Specific risks of infection, DVT, component   failure, dislocation, neurovascular injury, and need for revision surgery were reviewed in the office as well discussion of   the anterior versus posterior approach were reviewed.     PROCEDURE IN DETAIL:  The patient was brought to operative theater.   Once adequate anesthesia, preoperative antibiotics, 2 gm of Ancef, 1 gm of Tranexamic Acid, and 10 mg  of Decadron were administered, the patient was positioned supine on the [employer] table.  Once the patient was safely positioned with adequate padding of boney prominences we predraped out the hip, and used fluoroscopy to confirm orientation of the pelvis.      The left hip was then prepped and draped from proximal iliac crest to   mid thigh with a shower curtain technique.      Time-out was performed identifying the patient, planned procedure, and the appropriate extremity.     An incision was then made 2 cm lateral to the   anterior superior iliac spine extending over the orientation of the   tensor fascia lata muscle and sharp dissection was carried down to the   fascia of the muscle.      The fascia was then incised.  The muscle belly was identified and swept   laterally and retractor placed along the superior neck.  Following   cauterization of the circumflex vessels and removing some  pericapsular   fat, a second cobra retractor was placed on the inferior neck.  A T-capsulotomy was made along the line of the   superior neck to the trochanteric fossa, then extended proximally and   distally.  Tag sutures were placed and the retractors were then placed   intracapsular.  We then identified the trochanteric fossa and   orientation of my neck cut and then made a neck osteotomy with the femur on traction.  The femoral   head was removed without difficulty or complication.  Traction was let   off and retractors were placed posterior and anterior around the   acetabulum.      The labrum and foveal tissue were debrided.  I began reaming with a 47 mm   reamer and reamed up to 55 mm reamer with good bony bed preparation and a 56 mm  cup was chosen.  The final 56 mm Pinnacle cup was then impacted under fluoroscopy to confirm the depth of penetration and orientation with respect to   Abduction and forward flexion.  A screw was placed into the ilium followed by the hole eliminator.  The final    36+4 neutral Altrex liner was impacted with good visualized rim fit.  The cup was positioned anatomically within the acetabular portion of the pelvis.      At this point, the femur was rolled to 100 degrees.  Further capsule was   released off the inferior aspect of the femoral neck.  I then   released the superior capsule proximally.  With the leg in a neutral position the hook was placed laterally   along the femur under the vastus lateralis origin and elevated manually and then held in position using the hook attachment on the bed.  The leg was then extended and adducted with the leg rolled to 100   degrees of external rotation.  Retractors were placed along the medial calcar and posteriorly over the greater trochanter.  Once the proximal femur was fully   exposed, I used a box osteotome to set orientation.  I then began   broaching with the starting chili pepper broach and passed this by hand and then broached up to 8.  With the 8 broach in place I chose a high offset neck and did several trial reductions.  The offset was appropriate, leg lengths   appeared to be equal best matched with the +1.5 head ball trial confirmed radiographically.   Given these findings, I went ahead and dislocated the hip, repositioned all   retractors and positioned the right hip in the extended and abducted position.  The final 8 Hi Actis stem was   chosen and it was impacted down to the level of neck cut.  Based on this   and the trial reductions, a final 36+1.5 delta ceramic ball was chosen and   impacted onto a clean and dry trunnion, and the hip was reduced.  The   hip had been irrigated throughout the case again at this point.  I did   reapproximate the superior capsular leaflet to the anterior leaflet   using #1 Vicryl.  The fascia of the   tensor fascia lata muscle was then reapproximated using #1 Vicryl and #0 Stratafix sutures.  The   remaining wound was closed with 2-0 Vicryl and running 4-0 Monocryl.    The hip was cleaned, dried, and dressed sterilely using Dermabond and   Aquacel dressing.  The patient was then brought   to  recovery room in stable condition tolerating the procedure well.    Costella Hatcher, PA-C was present for the entirety of the case involved from   preoperative positioning, perioperative retractor management, general   facilitation of the case, as well as primary wound closure as assistant.            Pietro Cassis Alvan Dame, M.D.        09/29/2021 11:17 AM

## 2021-09-29 NOTE — Op Note (Signed)
I was called to the OR for Mr. Speranza to aid in Foley catheter placement.  Mr. Meininger is urologic history is significant for history of brachytherapy in 2018 and a urethral stricture dilation in January of this year.  I prepped and draped the patient in the usual sterile fashion.  I then carefully advanced a 80 French coud catheter.  There was some resistance encountered at the level of the prostate but ultimately the the catheter was able to be advanced into the bladder.  The balloon was inflated with 10 cc of sterile water and there was return of clear yellow urine.  It was connected to the appropriate collection bag  I recommend the catheter stay in place at least overnight at which time the patient may remove the catheter himself or follow-up in our clinic for voiding trial.  Donald Pore MD 09/29/2021, 2:58 PM  [facility]  Pager: [phone]

## 2021-09-29 NOTE — Evaluation (Signed)
Physical Therapy Evaluation Patient Details Name: Julian Pineda MRN: [id number] DOB: 12/21/1945 Today's Date: 09/29/2021  History of Present Illness  Patient is 75 y.o. male s/p Lt THA anterior approach on 09/29/21 with PMH significant for RA, GERD, HTN, OA, Prostate cancer, Rt achilles tendon surgery, Lt TKA in 2014.   Clinical Impression  Julian Pineda is a 75 y.o. male POD 0 s/p Lt THA. Patient reports independence with mobility at baseline. Patient is now limited by functional impairments (see PT problem list below) and requires min guard/supervision for transfers and gait with RW. Patient was able to ambulate ~200 feet with RW and min guard/supervision and cues for safe walker management. Patient educated on safe sequencing for step/curb mobility and verbalized safe guarding position for people assisting with mobility. Patient instructed in exercises to facilitate ROM and circulation. Patient will benefit from continued skilled PT interventions to address impairments and progress towards PLOF. Patient has met mobility goals at adequate level for discharge home; will continue to follow if pt continues acute stay to progress towards Mod I goals.        Recommendations for follow up therapy are one component of a multi-disciplinary discharge planning process, led by the attending physician.  Recommendations may be updated based on patient status, additional functional criteria and insurance authorization.  Follow Up Recommendations Follow physician's recommendations for discharge plan and follow up therapies    Assistance Recommended at Discharge Intermittent Supervision/Assistance  Functional Status Assessment Patient has had a recent decline in their functional status and demonstrates the ability to make significant improvements in function in a reasonable and predictable amount of time.  Equipment Recommendations  None recommended by PT    Recommendations for Other Services        Precautions / Restrictions Precautions Precautions: Fall Restrictions Weight Bearing Restrictions: No Other Position/Activity Restrictions: WBAT      Mobility  Bed Mobility Overal bed mobility: Needs Assistance Bed Mobility: Supine to Sit     Supine to sit: Supervision;HOB elevated     General bed mobility comments: pt taking extra time    Transfers Overall transfer level: Needs assistance Equipment used: Rolling walker (2 wheels) Transfers: Sit to/from Stand Sit to Stand: Min guard;Supervision           General transfer comment: cues for power up from EOB with RW, no assist, pt using single UE to rise and control lowering    Ambulation/Gait Ambulation/Gait assistance: Min guard;Supervision Gait Distance (Feet): 200 Feet Assistive device: Rolling walker (2 wheels) Gait Pattern/deviations: Step-through pattern;Decreased stride length;Decreased weight shift to left Gait velocity: decr     General Gait Details: cues for safe step to pattern and proximity to RW, no overt LOB noted throughout  Stairs            Wheelchair Mobility    Modified Rankin (Stroke Patients Only)       Balance Overall balance assessment: Needs assistance;Mild deficits observed, not formally tested Sitting-balance support: Feet supported Sitting balance-Leahy Scale: Good     Standing balance support: During functional activity;Bilateral upper extremity supported;Reliant on assistive device for balance Standing balance-Leahy Scale: Fair                               Pertinent Vitals/Pain Pain Assessment: 0-10 Pain Score: 1  Pain Location: Lt hip Pain Descriptors / Indicators: Discomfort Pain Intervention(s): Limited activity within patient's tolerance;Monitored during session;Repositioned    Home Living  Family/patient expects to be discharged to:: Private residence Living Arrangements: Spouse/significant other Available Help at Discharge: Family Type of  Home: House Home Access: Stairs to enter Entrance Stairs-Rails: None Entrance Stairs-Number of Steps: threshold   Home Layout: One level Home Equipment: Conservation officer, nature (2 wheels);Grab bars - tub/shower      Prior Function Prior Level of Function : Independent/Modified Independent             Mobility Comments: enjoys golf and lifting weights at the gym       Hand Dominance   Dominant Hand: Right    Extremity/Trunk Assessment   Upper Extremity Assessment Upper Extremity Assessment: Overall WFL for tasks assessed    Lower Extremity Assessment Lower Extremity Assessment: Overall WFL for tasks assessed    Cervical / Trunk Assessment Cervical / Trunk Assessment: Normal  Communication   Communication: No difficulties  Cognition Arousal/Alertness: Awake/alert Behavior During Therapy: WFL for tasks assessed/performed Overall Cognitive Status: Within Functional Limits for tasks assessed                                          General Comments      Exercises Total Joint Exercises Ankle Circles/Pumps: AROM;Both;Other reps (comment);Seated Quad Sets: AROM;Left;Other reps (comment);Seated Short Arc Quad: AROM;Left;Other reps (comment);Seated Heel Slides: AROM;Left;Other reps (comment);Seated Hip ABduction/ADduction: AROM;Left;Other reps (comment);Seated   Assessment/Plan    PT Assessment Patient needs continued PT services  PT Problem List Decreased strength;Decreased range of motion;Decreased activity tolerance;Decreased mobility;Decreased balance;Decreased knowledge of use of DME;Decreased knowledge of precautions       PT Treatment Interventions DME instruction;Gait training;Stair training;Functional mobility training;Therapeutic activities;Therapeutic exercise;Balance training;Patient/family education    PT Goals (Current goals can be found in the Care Plan section)  Acute Rehab PT Goals Patient Stated Goal: get home today and back to  golf PT Goal Formulation: With patient Time For Goal Achievement: 10/06/21 Potential to Achieve Goals: Good    Frequency 7X/week   Barriers to discharge        Co-evaluation               AM-PAC PT "6 Clicks" Mobility  Outcome Measure Help needed turning from your back to your side while in a flat bed without using bedrails?: None Help needed moving from lying on your back to sitting on the side of a flat bed without using bedrails?: A Little Help needed moving to and from a bed to a chair (including a wheelchair)?: A Little Help needed standing up from a chair using your arms (e.g., wheelchair or bedside chair)?: A Little Help needed to walk in hospital room?: A Little Help needed climbing 3-5 steps with a railing? : A Little 6 Click Score: 19    End of Session Equipment Utilized During Treatment: Gait belt Activity Tolerance: Patient tolerated treatment well Patient left: in chair;with call bell/phone within reach Nurse Communication: Mobility status PT Visit Diagnosis: Muscle weakness (generalized) (M62.81);Difficulty in walking, not elsewhere classified (R26.2)    Time: 1539-1610 PT Time Calculation (min) (ACUTE ONLY): 31 min   Charges:   PT Evaluation $PT Eval Low Complexity: 1 Low PT Treatments $Gait Training: 8-22 mins        Verner Mould, DPT Acute Rehabilitation Services Office [phone] Pager [phone]   Jacques Navy 09/29/2021, 4:27 PM

## 2021-09-30 ENCOUNTER — Encounter (HOSPITAL_COMMUNITY): Payer: Self-pay | Admitting: Orthopedic Surgery

## 2021-09-30 ENCOUNTER — Ambulatory Visit: Payer: PPO

## 2021-10-06 DIAGNOSIS — M9903 Segmental and somatic dysfunction of lumbar region: Secondary | ICD-10-CM | POA: Diagnosis not present

## 2021-10-06 DIAGNOSIS — M62830 Muscle spasm of back: Secondary | ICD-10-CM | POA: Diagnosis not present

## 2021-10-06 DIAGNOSIS — M5136 Other intervertebral disc degeneration, lumbar region: Secondary | ICD-10-CM | POA: Diagnosis not present

## 2021-10-06 DIAGNOSIS — M5416 Radiculopathy, lumbar region: Secondary | ICD-10-CM | POA: Diagnosis not present

## 2021-10-19 ENCOUNTER — Emergency Department
Admission: EM | Admit: 2021-10-19 | Discharge: 2021-10-19 | Disposition: A | Discharge status: Home / Self Care | Payer: PPO | Attending: Emergency Medicine | Admitting: Emergency Medicine

## 2021-10-19 ENCOUNTER — Other Ambulatory Visit: Payer: Self-pay

## 2021-10-19 DIAGNOSIS — I959 Hypotension, unspecified: Secondary | ICD-10-CM | POA: Diagnosis not present

## 2021-10-19 DIAGNOSIS — R55 Syncope and collapse: Secondary | ICD-10-CM | POA: Insufficient documentation

## 2021-10-19 DIAGNOSIS — Z96652 Presence of left artificial knee joint: Secondary | ICD-10-CM | POA: Diagnosis not present

## 2021-10-19 DIAGNOSIS — R0902 Hypoxemia: Secondary | ICD-10-CM | POA: Diagnosis not present

## 2021-10-19 DIAGNOSIS — Z87891 Personal history of nicotine dependence: Secondary | ICD-10-CM | POA: Diagnosis not present

## 2021-10-19 DIAGNOSIS — Z85828 Personal history of other malignant neoplasm of skin: Secondary | ICD-10-CM | POA: Diagnosis not present

## 2021-10-19 DIAGNOSIS — I10 Essential (primary) hypertension: Secondary | ICD-10-CM | POA: Insufficient documentation

## 2021-10-19 DIAGNOSIS — Z96642 Presence of left artificial hip joint: Secondary | ICD-10-CM | POA: Insufficient documentation

## 2021-10-19 DIAGNOSIS — Z8546 Personal history of malignant neoplasm of prostate: Secondary | ICD-10-CM | POA: Diagnosis not present

## 2021-10-19 DIAGNOSIS — Z79899 Other long term (current) drug therapy: Secondary | ICD-10-CM | POA: Insufficient documentation

## 2021-10-19 LAB — BASIC METABOLIC PANEL
Anion gap: 7 (ref 5–15)
BUN: 16 mg/dL (ref 8–23)
CO2: 25 mmol/L (ref 22–32)
Calcium: 8.8 mg/dL — ABNORMAL LOW (ref 8.9–10.3)
Chloride: 104 mmol/L (ref 98–111)
Creatinine, Ser: 1.09 mg/dL (ref 0.61–1.24)
GFR, Estimated: >60 mL/min (ref >60)
Glucose, Bld: 118 mg/dL — ABNORMAL HIGH (ref 70–99)
Potassium: 3.6 mmol/L (ref 3.5–5.1)
Sodium: 136 mmol/L (ref 135–145)

## 2021-10-19 LAB — CBC
HCT: 34.1 % — ABNORMAL LOW (ref 39.0–52.0)
Hemoglobin: 11.5 g/dL — ABNORMAL LOW (ref 13.0–17.0)
MCH: 33.9 pg (ref 26.0–34.0)
MCHC: 33.7 g/dL (ref 30.0–36.0)
MCV: 100.6 fL — ABNORMAL HIGH (ref 80.0–100.0)
Platelets: 247 10*3/uL (ref 150–400)
RBC: 3.39 MIL/uL — ABNORMAL LOW (ref 4.22–5.81)
RDW: 12.4 % (ref 11.5–15.5)
WBC: 7.6 10*3/uL (ref 4.0–10.5)
nRBC: 0 % (ref 0.0–0.2)

## 2021-10-19 LAB — TROPONIN I (HIGH SENSITIVITY)
Troponin I (High Sensitivity): 5 ng/L (ref <18)
Troponin I (High Sensitivity): 7 ng/L (ref <18)

## 2021-10-19 MED ORDER — LACTATED RINGERS IV BOLUS
1000.0000 mL | Freq: Once | INTRAVENOUS | Status: AC
  Administered 2021-10-19: 10:00:00 1000 mL via INTRAVENOUS

## 2021-10-19 NOTE — ED Triage Notes (Signed)
Pt here via [facility] from home after passing out at the gym this morning. Pt hit head when he fell. Pt not A&O x4, no hx of heart issues.    128-cbg 118/52

## 2021-10-19 NOTE — ED Provider Notes (Signed)
[facility] Emergency Department Provider Note   ____________________________________________   Event Date/Time   First MD Initiated Contact with Patient 10/19/21 1001     (approximate)  I have reviewed the triage vital signs and the nursing notes.   HISTORY  Chief Complaint Loss of Consciousness    HPI Julian Pineda is a 75 y.o. male with past medical history of hypertension and rheumatoid arthritis who presents to the ED complaining of syncope.  Patient reports that when he woke up this morning he was feeling somewhat lightheaded and weak.  He then attempted to go to the gym and was only able to lift weights for a couple of minutes before the lightheadedness seemed to get worse.  He was able to make it to the bathroom, then sat down in a chair, where he eventually passed out.  He did not fall from the chair or hit his head.  He denies any associated chest pain or shortness of breath with the episode.  He does admit that he has been dealing with poor appetite recently, ever since he had his gallbladder removed about a month ago.  He denies any nausea, vomiting, abdominal pain, or diarrhea.  He does admit that he has not had anything to eat since last night.  EMS reports initial blood pressure of 90 systolic that improved with 500 cc of IV fluids.  Patient reports a couple of similar episodes in the past, had been evaluated by cardiology at that time with unremarkable work-up.        Past Medical History:  Diagnosis Date   Arthritis, rheumatoid ([place])    Diverticulosis of colon    GERD (gastroesophageal reflux disease)    "occas"   History of basal cell carcinoma (BCC) excision    09-25-2014  right lower eyelid s/p moh's dx /  left lower eyelid scheduled removal 07-05-2017   History of squamous cell carcinoma excision    01/ 2015  left lateral cheek  s/p  moh's sx   Hypertension    sees Dr. Jackalyn Lombard, [place]   Nocturia    OA (osteoarthritis)     Prostate cancer [facility]) urologist-  dr eskidge/  oncologist- dr [name]   dx 04/ 2018-- Stage T1c, Gleason 3+3,  PSA 7.1,  vol 38.6cc   Rheumatoid arteritis ([place])    rheumotologist-   dr Elpidio Galea   Rosacea    Dr. 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   Barrett esophagus 08/21/2021   Elevated LDL cholesterol level 01/14/2020   Alcohol use 11/16/2019   Chronic fatigue 09/10/2019   Nausea 03/21/2019   Physical deconditioning 05/15/2018   Basal cell carcinoma 07/28/2017   Low back pain 01/25/2017   History of prostate cancer 08/24/2016   Personal history of other malignant neoplasm of skin 11/20/2013   S/P TKR (total knee replacement) 01/16/2013   Preop examination 11/10/2012   Rheumatoid arthritis ([place]) 02/16/2012   Vitamin D deficiency disease 02/16/2012   Hypertension 02/16/2012    Past Surgical History:  Procedure Laterality Date   ACHILLES TENDON SURGERY Right 1984   CHOLECYSTECTOMY N/A 09/02/2021   Procedure: LAPAROSCOPIC CHOLECYSTECTOMY;  Surgeon: Coralie Keens, MD;  Location: [facility];  Service: General;  Laterality: N/A;   COLONOSCOPY  2008   COLONOSCOPY WITH PROPOFOL N/A 11/09/2017   Procedure: COLONOSCOPY WITH PROPOFOL;  Surgeon:  Manya Silvas, MD;  [facility];  Service: Endoscopy;  Laterality: N/A;   MOHS SURGERY  09-25-2014;  11-27-2013   [place]   09-25-2014 right lower eyelid /  11-27-2013  left lateral cheek   RADIOACTIVE SEED IMPLANT N/A 06/17/2017   Procedure: RADIOACTIVE SEED IMPLANT/BRACHYTHERAPY IMPLANT;  Surgeon: Festus Aloe, MD;  Location: [facility];  Service: Urology;  Laterality: N/A;   TONSILLECTOMY  child   TOTAL HIP ARTHROPLASTY Left 09/29/2021   Procedure: TOTAL HIP ARTHROPLASTY ANTERIOR APPROACH;  Surgeon: Paralee Cancel, MD;  Location: [facility];  Service: Orthopedics;  Laterality: Left;   TOTAL  KNEE ARTHROPLASTY  12/04/2012   Procedure: TOTAL KNEE ARTHROPLASTY;  Surgeon: Vickey Huger, MD;  Location: [place];  Service: Orthopedics;  Laterality: Left;  left total knee arthroplasty   VARICOSE VEIN SURGERY  2000   left leg    Prior to Admission medications   Medication Sig Start Date End Date Taking? Authorizing Provider  acetaminophen (TYLENOL) 650 MG suppository Place 650 mg rectally every 4 (four) hours as needed.   Yes [provider]  amLODipine (NORVASC) 5 MG tablet Take 1 tablet (5 mg total) by mouth daily. 09/24/20  Yes Leone Haven, MD  Ascorbic Acid (VITAMIN C) 1000 MG tablet Take 1,000 mg by mouth daily.   Yes [provider]  aspirin (ASPIRIN CHILDRENS) 81 MG chewable tablet Chew 1 tablet (81 mg total) by mouth in the morning and at bedtime for 28 days. 09/29/21 10/27/21 Yes Irving Copas, PA-C  cholecalciferol (VITAMIN D) 1000 units tablet Take 1,000 Units by mouth every morning.    Yes [provider]  docusate sodium (COLACE) 100 MG capsule Take 1 capsule (100 mg total) by mouth 2 (two) times daily. 09/29/21  Yes Costella Hatcher R, PA-C  folic acid (FOLVITE) 1 MG tablet TAKE 1 TABLET EVERY DAY 09/15/15  Yes Jackolyn Confer, MD  losartan (COZAAR) 100 MG tablet TAKE 1 TABLET BY MOUTH DAILY 07/22/21  Yes Leone Haven, MD  meloxicam (MOBIC) 15 MG tablet TAKE 1 TABLET BY MOUTH ONCE DAILY WITH FOOD 10/07/16  Yes Leone Haven, MD  methocarbamol (ROBAXIN) 500 MG tablet Take 1 tablet (500 mg total) by mouth every 6 (six) hours as needed for muscle spasms. 09/29/21  Yes Irving Copas, PA-C  metroNIDAZOLE (METROCREAM) 0.75 % cream Apply 1 application topically daily.    Yes [provider]  polyethylene glycol (MIRALAX / GLYCOLAX) 17 g packet Take 17 g by mouth 2 (two) times daily. 09/29/21  Yes Irving Copas, PA-C  HYDROcodone-acetaminophen (NORCO/VICODIN) 5-325 MG tablet Take 1-2 tablets by mouth every 6 (six) hours as  needed for severe pain. Patient not taking: Reported on 10/19/2021 09/29/21   Irving Copas, PA-C  methotrexate 50 MG/2ML injection INJECT 1ML WEEKLY Patient taking differently: Inject 25 mg into the muscle every Wednesday. 04/15/16   Jackolyn Confer, MD  ondansetron (ZOFRAN ODT) 4 MG disintegrating tablet Take 1 tablet (4 mg total) by mouth every 8 (eight) hours as needed for nausea or vomiting. Patient not taking: Reported on 09/11/2021 09/02/21   Coralie Keens, MD  pantoprazole (PROTONIX) 40 MG tablet Take 40 mg by mouth daily. Patient not taking: Reported on 10/19/2021    [provider]    Allergies Penicillins  Family History  Problem Relation Age of Onset   Cancer Mother        Multiple Myeloma   Alcohol abuse Father  Arthritis Father    Prostate cancer Brother    Breast cancer Daughter    Hematuria Neg Hx    Sickle cell anemia Neg Hx    Kidney cancer Neg Hx    Bladder Cancer Neg Hx     Social History Social History   Tobacco Use   Smoking status: Never   Smokeless tobacco: Former    Types: Chew    Quit date: 06/06/2016  Vaping Use   Vaping Use: Never used  Substance Use Topics   Alcohol use: Yes    Alcohol/week: 14.0 standard drinks    Types: 14 Shots of liquor per week    Comment: one dirnk nightly   Drug use: No    Review of Systems  Constitutional: No fever/chills Eyes: No visual changes. ENT: No sore throat. Cardiovascular: Denies chest pain.  Positive for lightheadedness and syncope. Respiratory: Denies shortness of breath. Gastrointestinal: No abdominal pain.  No nausea, no vomiting.  No diarrhea.  No constipation. Genitourinary: Negative for dysuria. Musculoskeletal: Negative for back pain. Skin: Negative for rash. Neurological: Negative for headaches, focal weakness or numbness.  ____________________________________________   PHYSICAL EXAM:  VITAL SIGNS: ED Triage Vitals  Enc Vitals Group     BP 10/19/21 1004 (!)  113/59     Pulse Rate 10/19/21 1004 71     Resp 10/19/21 1004 20     Temp 10/19/21 1004 98.4 F (36.9 C)     Temp Source 10/19/21 1004 Oral     SpO2 10/19/21 1004 99 %     Weight 10/19/21 0959 194 lb 7.1 oz (88.2 kg)     Height 10/19/21 0959 '5\' 10"'  (1.778 m)     Head Circumference --      Peak Flow --      Pain Score 10/19/21 0959 0     Pain Loc --      Pain Edu? --      Excl. in [place]? --     Constitutional: Alert and oriented. Eyes: Conjunctivae are normal. Head: Atraumatic. Nose: No congestion/rhinnorhea. Mouth/Throat: Mucous membranes are moist. Neck: Normal ROM Cardiovascular: Normal rate, regular rhythm. Grossly normal heart sounds.  2+ radial pulses bilaterally. Respiratory: Normal respiratory effort.  No retractions. Lungs CTAB. Gastrointestinal: Soft and nontender. No distention. Genitourinary: deferred Musculoskeletal: No lower extremity tenderness nor edema. Neurologic:  Normal speech and language. No gross focal neurologic deficits are appreciated. Skin:  Skin is warm, dry and intact. No rash noted. Psychiatric: Mood and affect are normal. Speech and behavior are normal.  ____________________________________________   LABS (all labs ordered are listed, but only abnormal results are displayed)  Labs Reviewed  BASIC METABOLIC PANEL - Abnormal; Notable for the following components:      Result Value   Glucose, Bld 118 (*)    Calcium 8.8 (*)    All other components within normal limits  CBC - Abnormal; Notable for the following components:   RBC 3.39 (*)    Hemoglobin 11.5 (*)    HCT 34.1 (*)    MCV 100.6 (*)    All other components within normal limits  URINALYSIS, ROUTINE W REFLEX MICROSCOPIC  CBG MONITORING, ED  TROPONIN I (HIGH SENSITIVITY)  TROPONIN I (HIGH SENSITIVITY)   ____________________________________________  EKG  ED ECG REPORT I, Blake Divine, the attending physician, personally viewed and interpreted this ECG.   Date: 10/19/2021  EKG  Time: 10:03  Rate: 73  Rhythm: normal sinus rhythm  Axis: Normal  Intervals:none  ST&T Change: None  PROCEDURES  Procedure(s) performed (including Critical Care):  Procedures   ____________________________________________   INITIAL IMPRESSION / ASSESSMENT AND PLAN / ED COURSE      75 year old male with past medical history of hypertension and rheumatoid arthritis who presents to the ED complaining of lightheadedness and syncopal episode while at the gym this morning.  EKG shows no evidence of arrhythmia or ischemia and blood pressure has normalized at the time of patient's arrival to the ED.  He denies any chest pain or shortness of breath and overall suspicion for cardiac etiology is low.  He has dealt with similar episodes in the past with unremarkable echocardiogram, thought to be vasovagal episodes by cardiology.  Patient is feeling much better at this time, we will observe on the cardiac monitor, hydrate with IV fluids, and check labs.  No events noted on cardiac monitor, patient remains asymptomatic here in the ED.  2 sets of troponin are negative remainder of labs are unremarkable.  Given patient's similar episodes in the past with unremarkable work-up, I do not feel readmission is indicated at this time.  He is appropriate for outpatient follow-up with cardiology and was counseled to return to the ED for new worsening symptoms.  Patient agrees with plan.      ____________________________________________   FINAL CLINICAL IMPRESSION(S) / ED DIAGNOSES  Final diagnoses:  Syncope, unspecified syncope type     ED Discharge Orders     None        Note:  This document was prepared using Dragon voice recognition software and may include unintentional dictation errors.    Blake Divine, MD 10/19/21 1440

## 2021-10-20 DIAGNOSIS — M9903 Segmental and somatic dysfunction of lumbar region: Secondary | ICD-10-CM | POA: Diagnosis not present

## 2021-10-20 DIAGNOSIS — M62830 Muscle spasm of back: Secondary | ICD-10-CM | POA: Diagnosis not present

## 2021-10-20 DIAGNOSIS — M5416 Radiculopathy, lumbar region: Secondary | ICD-10-CM | POA: Diagnosis not present

## 2021-10-20 DIAGNOSIS — M5136 Other intervertebral disc degeneration, lumbar region: Secondary | ICD-10-CM | POA: Diagnosis not present

## 2021-11-03 DIAGNOSIS — M62830 Muscle spasm of back: Secondary | ICD-10-CM | POA: Diagnosis not present

## 2021-11-03 DIAGNOSIS — M9903 Segmental and somatic dysfunction of lumbar region: Secondary | ICD-10-CM | POA: Diagnosis not present

## 2021-11-03 DIAGNOSIS — M9905 Segmental and somatic dysfunction of pelvic region: Secondary | ICD-10-CM | POA: Diagnosis not present

## 2021-11-03 DIAGNOSIS — M5136 Other intervertebral disc degeneration, lumbar region: Secondary | ICD-10-CM | POA: Diagnosis not present

## 2021-11-09 DIAGNOSIS — R208 Other disturbances of skin sensation: Secondary | ICD-10-CM | POA: Diagnosis not present

## 2021-11-09 DIAGNOSIS — D0439 Carcinoma in situ of skin of other parts of face: Secondary | ICD-10-CM | POA: Diagnosis not present

## 2021-11-09 DIAGNOSIS — D225 Melanocytic nevi of trunk: Secondary | ICD-10-CM | POA: Diagnosis not present

## 2021-11-09 DIAGNOSIS — D0471 Carcinoma in situ of skin of right lower limb, including hip: Secondary | ICD-10-CM | POA: Diagnosis not present

## 2021-11-09 DIAGNOSIS — X32XXXA Exposure to sunlight, initial encounter: Secondary | ICD-10-CM | POA: Diagnosis not present

## 2021-11-09 DIAGNOSIS — Z85828 Personal history of other malignant neoplasm of skin: Secondary | ICD-10-CM | POA: Diagnosis not present

## 2021-11-09 DIAGNOSIS — D045 Carcinoma in situ of skin of trunk: Secondary | ICD-10-CM | POA: Diagnosis not present

## 2021-11-09 DIAGNOSIS — L570 Actinic keratosis: Secondary | ICD-10-CM | POA: Diagnosis not present

## 2021-11-09 DIAGNOSIS — D2262 Melanocytic nevi of left upper limb, including shoulder: Secondary | ICD-10-CM | POA: Diagnosis not present

## 2021-11-09 DIAGNOSIS — D485 Neoplasm of uncertain behavior of skin: Secondary | ICD-10-CM | POA: Diagnosis not present

## 2021-11-09 DIAGNOSIS — L821 Other seborrheic keratosis: Secondary | ICD-10-CM | POA: Diagnosis not present

## 2021-11-09 DIAGNOSIS — C44329 Squamous cell carcinoma of skin of other parts of face: Secondary | ICD-10-CM | POA: Diagnosis not present

## 2021-11-11 DIAGNOSIS — M150 Primary generalized (osteo)arthritis: Secondary | ICD-10-CM | POA: Diagnosis not present

## 2021-11-11 DIAGNOSIS — Z471 Aftercare following joint replacement surgery: Secondary | ICD-10-CM | POA: Diagnosis not present

## 2021-11-11 DIAGNOSIS — Z96642 Presence of left artificial hip joint: Secondary | ICD-10-CM | POA: Diagnosis not present

## 2021-11-11 DIAGNOSIS — M0589 Other rheumatoid arthritis with rheumatoid factor of multiple sites: Secondary | ICD-10-CM | POA: Diagnosis not present

## 2021-11-11 DIAGNOSIS — Z4789 Encounter for other orthopedic aftercare: Secondary | ICD-10-CM | POA: Diagnosis not present

## 2021-11-11 DIAGNOSIS — E663 Overweight: Secondary | ICD-10-CM | POA: Diagnosis not present

## 2021-11-11 DIAGNOSIS — R5382 Chronic fatigue, unspecified: Secondary | ICD-10-CM | POA: Diagnosis not present

## 2021-11-11 DIAGNOSIS — Z6828 Body mass index (BMI) 28.0-28.9, adult: Secondary | ICD-10-CM | POA: Diagnosis not present

## 2021-11-11 DIAGNOSIS — M19049 Primary osteoarthritis, unspecified hand: Secondary | ICD-10-CM | POA: Diagnosis not present

## 2021-11-11 DIAGNOSIS — Z79899 Other long term (current) drug therapy: Secondary | ICD-10-CM | POA: Diagnosis not present

## 2021-11-17 DIAGNOSIS — M9905 Segmental and somatic dysfunction of pelvic region: Secondary | ICD-10-CM | POA: Diagnosis not present

## 2021-11-17 DIAGNOSIS — M5136 Other intervertebral disc degeneration, lumbar region: Secondary | ICD-10-CM | POA: Diagnosis not present

## 2021-11-17 DIAGNOSIS — M62830 Muscle spasm of back: Secondary | ICD-10-CM | POA: Diagnosis not present

## 2021-11-17 DIAGNOSIS — M9903 Segmental and somatic dysfunction of lumbar region: Secondary | ICD-10-CM | POA: Diagnosis not present

## 2021-11-20 ENCOUNTER — Ambulatory Visit: Payer: PPO | Admitting: Cardiovascular Disease

## 2021-11-20 ENCOUNTER — Other Ambulatory Visit: Payer: Self-pay

## 2021-11-20 ENCOUNTER — Encounter: Payer: Self-pay | Admitting: Cardiovascular Disease

## 2021-11-20 VITALS — BP 148/78 | HR 66 | Ht 70.0 in | Wt 197.0 lb

## 2021-11-20 DIAGNOSIS — R072 Precordial pain: Secondary | ICD-10-CM | POA: Diagnosis not present

## 2021-11-20 DIAGNOSIS — I209 Angina pectoris, unspecified: Secondary | ICD-10-CM | POA: Diagnosis not present

## 2021-11-20 DIAGNOSIS — R55 Syncope and collapse: Secondary | ICD-10-CM | POA: Diagnosis not present

## 2021-11-20 MED ORDER — METOPROLOL TARTRATE 100 MG PO TABS: ORAL_TABLET | ORAL | 0 refills | Status: DC

## 2021-11-20 NOTE — Patient Instructions (Addendum)
° °  Medication Instructions:   Take Metoprolol (lopressor) 100MG  2 hours prior to your CT  Consider moving one of the BP meds to evening Monitor BP  Hydrate  If you need a refill on your cardiac medications before your next appointment, please call your pharmacy.   Lab work:  [employer]  Testing/Procedures: 1) Cardiac CT angiogram:  - Your physician has requested that you have cardiac CT. Cardiac computed tomography (CT) is a painless test that uses an x-ray machine to take clear, detailed pictures of your heart.    [facility] [address] [place], [place] [address] [phone]   [facility], please arrive 15 mins early for check-in and test prep.  Please follow these instructions carefully (unless otherwise directed):  Hold all erectile dysfunction medications at least 3 days (72 hrs) prior to test.  On the Night Before the Test: Be sure to Drink plenty of water. Do not consume any caffeinated/decaffeinated beverages or chocolate 12 hours prior to your test. Do not take any antihistamines 12 hours prior to your test.   On the Day of the Test: Drink plenty of water until 1 hour prior to the test. Do not eat any food 4 hours prior to the test. You may take your regular medications prior to the test.  Take metoprolol (Lopressor) 100 mg two hours prior to test.       After the Test: Drink plenty of water. After receiving IV contrast, you may experience a mild flushed feeling. This is normal. On occasion, you may experience a mild rash up to 24 hours after the test. This is not dangerous. If this occurs, you can take Benadryl 25 mg and increase your fluid intake. If you experience trouble breathing, this can be serious. If it is severe call 911 IMMEDIATELY. If it is mild, please call our office.  We will call to schedule your test 2-4 weeks out understanding that some insurance companies will need an  authorization prior to the service being performed.   For non-scheduling related questions, please contact the cardiac imaging nurse navigator should you have any questions/concerns: Marchia Bond, Cardiac Imaging Nurse Navigator Gordy Clement, Cardiac Imaging Nurse Navigator [facility] Direct Office Dial: [phone]   For scheduling needs, including cancellations and rescheduling, please call [place], [phone].     Follow-Up: At [facility], you and your health needs are our priority.  As part of our continuing mission to provide you with exceptional heart care, we have created designated Provider Care Teams.  These Care Teams include your primary Cardiologist (physician) and Advanced Practice Providers (APPs -  Physician Assistants and Nurse Practitioners) who all work together to provide you with the care you need, when you need it.  You will need a follow up appointment as needed  Providers on your designated Care Team:   Murray Hodgkins, NP Christell Faith, PA-C Cadence Kathlen Mody, [place]  COVID-19 Vaccine Information can be found at: [email or web address] For questions related to vaccine distribution or appointments, please email [email or web address] or call [phone].

## 2021-11-20 NOTE — Progress Notes (Signed)
[facility] Office Note  Date:  11/20/2021   ID:  Termaine, Pineda [birth year], MRN [id number]  PCP:  Leone Haven, MD   Chief Complaint  Patient presents with   New Patient (Initial Visit)    New patient to establish care with provider for follow up for ED visit. Patient stated he passed out while he was working out at [employer]. He went to the ED and everything checked out fine but referred him to cardiologist. Medications verbally reviewed with patient.     HPI:  Mr. Julian Pineda is a 63 or generally past medical history of Syncope   06/2016 and 12/2017 Who presents by referral from Dr. Caryl Bis for consultation of his recent episode of syncope  Discussed recent events from October 19, 2021 Reports that he woke up, did not feel well that morning  Despite this went to the gym, did not have breakfast, not much fluid intake Working out at the gym had dizziness symptoms, Went to the bathroom bathroom, urination Was doing machine, worsening symptoms, developed syncope, was on a machine at the time EMTs called, Wife reports that he had second episode while on the gurney with EMTs  Hospital records reviewed  EMS reports initial blood pressure of 90 systolic that improved with 500 cc of IV fluids. No significant cardiac arrhythmia noted, lab work essentially normal, renal function normal, did not appear dehydrated Reports feeling relatively well since that time In general reports having poor fluid intake  Reports taking amlodipine and losartan every morning  Chronic nausea at times, etiology unclear  EKG personally reviewed by myself on todays visit Normal sinus rhythm rate 66 bpm no significant ST-T wave changes  Other past medical history reviewed Prior episodes of syncope, August 2017 syncope 2 after playing 18 holes of golf in th heat Was dehydrated, CR 1.7, BUN 24  Has had several episodes of not feeling right since that time Often associated with upset  stomach  drinking alcohol  In The setting of dehydration   hospitalized from 01/27/18-01/29/18 for syncope.  Was out to eat, did not eat much, did not feel well he had had 3 alcoholic beverages   sitting in the car when he started to feel weak and lightheaded with a little bit of light sensitivity Vomited and  passed out.   cold and clammy.  had not had much water to drink prior to the event.    EMS found his blood pressure to be 60/40.  They gave him fluid.   Wife reports that they rolled him on his side and he recovered relatively quickly and wanted to stand up  Echo 01/29/2018 Normal EF   PMH:   has a past medical history of Arthritis, rheumatoid ([place]), Diverticulosis of colon, GERD (gastroesophageal reflux disease), History of basal cell carcinoma (BCC) excision, History of squamous cell carcinoma excision, Hypertension, Nocturia, OA (osteoarthritis), Prostate cancer 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  PSH:    Past Surgical History:  Procedure Laterality Date   ACHILLES TENDON SURGERY Right 1984   CHOLECYSTECTOMY N/A 09/02/2021   Procedure: LAPAROSCOPIC CHOLECYSTECTOMY;  Surgeon: Coralie Keens, MD;  Location: [facility];  Service: General;  Laterality: N/A;   COLONOSCOPY  2008   COLONOSCOPY WITH PROPOFOL N/A 11/09/2017   Procedure: COLONOSCOPY WITH PROPOFOL;  Surgeon: Manya Silvas, MD;  Location[facility] END[facility]COPY;  Service: Endoscopy;  Laterality: N/A;   MOHS SURGERY  09-25-2014;  11-27-2013   [facility]   09-25-2014 right lower eyelid /  11-27-2013  left lateral cheek   RADIOACTIVE SEED IMPLANT N/A 06/17/2017   Procedure: RADIOACTIVE SEED IMPLANT/BRACHYTHERAPY IMPLANT;  Surgeon: Festus Aloe, MD;  [facility];  Service: Urology;  Laterality: N/A;   TONSILLECTOMY  child   TOTAL HIP ARTHROPLASTY Left 09/29/2021   Procedure: TOTAL HIP ARTHROPLASTY ANTERIOR  APPROACH;  Surgeon: Paralee Cancel, MD;  Location: [facility];  Service: Orthopedics;  Laterality: Left;   TOTAL KNEE ARTHROPLASTY  12/04/2012   Procedure: TOTAL KNEE ARTHROPLASTY;  Surgeon: Vickey Huger, MD;  Location: [place];  Service: Orthopedics;  Laterality: Left;  left total knee arthroplasty   VARICOSE VEIN SURGERY  2000   left leg    Current Outpatient Medications  Medication Sig Dispense Refill   acetaminophen (TYLENOL) 650 MG suppository Place 650 mg rectally every 4 (four) hours as needed.     amLODipine (NORVASC) 5 MG tablet Take 1 tablet (5 mg total) by mouth daily. 90 tablet 3   Ascorbic Acid (VITAMIN C) 1000 MG tablet Take 1,000 mg by mouth daily.     cholecalciferol (VITAMIN D) 1000 units tablet Take 1,000 Units by mouth every morning.      folic acid (FOLVITE) 1 MG tablet TAKE 1 TABLET EVERY DAY 90 tablet 0   losartan (COZAAR) 100 MG tablet TAKE 1 TABLET BY MOUTH DAILY 90 tablet 1   meloxicam (MOBIC) 15 MG tablet TAKE 1 TABLET BY MOUTH ONCE DAILY WITH FOOD 30 tablet 2   meloxicam (MOBIC) 15 MG tablet Take 1 tablet by mouth as needed.     methotrexate 50 MG/2ML injection INJECT 1ML WEEKLY (Patient taking differently: Inject 25 mg into the muscle every Wednesday.) 4 mL 6   metroNIDAZOLE (METROCREAM) 0.75 % cream Apply 1 application topically daily.      metroNIDAZOLE (METROGEL) 0.75 % gel Apply topically 2 (two) times daily.     ondansetron (ZOFRAN ODT) 4 MG disintegrating tablet Take 1 tablet (4 mg total) by mouth every 8 (eight) hours as needed for nausea or vomiting. 20 tablet 0   pantoprazole (PROTONIX) 40 MG tablet Take 40 mg by mouth daily.     No current facility-administered medications for this visit.     Allergies:   Penicillins   Social History:  The patient  reports that he has never smoked. He quit smokeless tobacco use about 5 years ago.  His smokeless tobacco use included chew. He reports current alcohol use of about 14.0 standard drinks per week. He reports that he  does not use drugs.   Family History:   family history includes Alcohol abuse in his father; Arthritis in his father; Breast cancer in his daughter; Cancer in his mother; Prostate cancer in his brother.    Review of Systems: Review of Systems  Constitutional: Negative.   Respiratory: Negative.    Cardiovascular: Negative.   Gastrointestinal: Negative.   Musculoskeletal: Negative.   Neurological:  Positive for loss of consciousness.  Psychiatric/Behavioral: Negative.    All other systems reviewed and are negative.   PHYSICAL EXAM: VS:  BP (!) 148/78 (BP Location: Right Arm, Patient Position: Sitting, Cuff Size: Normal)    Pulse 66    Ht 5\' 10"  (1.778 m)    Wt 197 lb (89.4 kg)    SpO2 98%    BMI 28.27 kg/m  , BMI Body mass index is 28.27 kg/m. Constitutional:  oriented to person, place, and time. No distress.  HENT:  Head: Grossly normal Eyes:  no discharge. No scleral icterus.  Neck: No JVD, no carotid bruits  Cardiovascular: Regular rate and rhythm, no murmurs appreciated Pulmonary/Chest: Clear to auscultation bilaterally, no wheezes or rails Abdominal: Soft.  no distension.  no tenderness.  Musculoskeletal: Normal range of motion Neurological:  normal muscle tone. Coordination normal. No atrophy Skin: Skin warm and dry Psychiatric: normal affect, pleasant  Recent Labs: 10/19/2021: BUN 16; Creatinine, Ser 1.09; Hemoglobin 11.5; Platelets 247; Potassium 3.6; Sodium 136    Lipid Panel Lab Results  Component Value Date   CHOL 198 09/26/2019   HDL 79.50 09/26/2019   LDLCALC 106 (H) 09/26/2019   TRIG 62.0 09/26/2019      Wt Readings from Last 3 Encounters:  11/20/21 197 lb (89.4 kg)  10/19/21 194 lb 7.1 oz (88.2 kg)  09/29/21 194 lb 8 oz (88.2 kg)     ASSESSMENT AND PLAN:  Syncope, unspecified syncope type  2 episodes several years ago possible vasovagal etiology, 1 with dehydration, 1 possible alcohol involvement with poor fluid intake --Most recent episode no  breakfast, or drink went to the gym to workout, had orthostasis symptoms After urinating, had near syncope then syncope, systolic pressure 90 with EMTs -Symptoms improved with IV hydration Has had some chest pain symptoms at times, unrelated Given concern for angina, syncope, cardiac CTA ordered Recommend he try to stay hydrated Suggested moving amlodipine to the evening losartan in the mornin,g   Malignant neoplasm of prostate ([place]) Managed by primary care  Essential hypertension Recommend he move amlodipine to the evening, continue losartan in the morning with food Monitor blood pressure during the daytime at home, call for low numbers    Total encounter time more than 60 minutes  Greater than 50% was spent in counseling and coordination of care with the patient    No orders of the defined types were placed in this encounter.    Signed, Esmond Plants, M.D., Ph.D. 11/20/2021  [place], [place]

## 2021-11-21 LAB — BASIC METABOLIC PANEL
BUN/Creatinine Ratio: 13 (ref 10–24)
BUN: 14 mg/dL (ref 8–27)
CO2: 23 mmol/L (ref 20–29)
Calcium: 9.8 mg/dL (ref 8.6–10.2)
Chloride: 102 mmol/L (ref 96–106)
Creatinine, Ser: 1.04 mg/dL (ref 0.76–1.27)
Glucose: 104 mg/dL — ABNORMAL HIGH (ref 70–99)
Potassium: 4.6 mmol/L (ref 3.5–5.2)
Sodium: 141 mmol/L (ref 134–144)
eGFR: 76 mL/min/{1.73_m2} (ref >59)

## 2021-11-27 ENCOUNTER — Ambulatory Visit: Payer: PPO | Admitting: Cardiovascular Disease

## 2021-12-01 DIAGNOSIS — M62830 Muscle spasm of back: Secondary | ICD-10-CM | POA: Diagnosis not present

## 2021-12-01 DIAGNOSIS — M9905 Segmental and somatic dysfunction of pelvic region: Secondary | ICD-10-CM | POA: Diagnosis not present

## 2021-12-01 DIAGNOSIS — M9903 Segmental and somatic dysfunction of lumbar region: Secondary | ICD-10-CM | POA: Diagnosis not present

## 2021-12-01 DIAGNOSIS — M5136 Other intervertebral disc degeneration, lumbar region: Secondary | ICD-10-CM | POA: Diagnosis not present

## 2021-12-04 ENCOUNTER — Telehealth (HOSPITAL_COMMUNITY): Payer: Self-pay | Admitting: *Deleted

## 2021-12-04 NOTE — Telephone Encounter (Signed)
Reaching out to patient to offer assistance regarding upcoming cardiac imaging study; pt verbalizes understanding of appt date/time, parking situation and where to check in, pre-test NPO status and medications ordered, and verified current allergies; name and call back number provided for further questions should they arise ° °Everlee Quakenbush RN Navigator Cardiac Imaging °[facility] °[phone] office °[phone] cell ° °Patient to take 100mg metoprolol tartrate two hours prior to his cardiac CT scan. °

## 2021-12-07 ENCOUNTER — Other Ambulatory Visit: Payer: Self-pay

## 2021-12-07 ENCOUNTER — Ambulatory Visit
Admission: RE | Admit: 2021-12-07 | Discharge: 2021-12-07 | Disposition: A | Discharge status: Home / Self Care | Payer: PPO | Source: Ambulatory Visit | Attending: Cardiovascular Disease | Admitting: Cardiovascular Disease

## 2021-12-07 ENCOUNTER — Other Ambulatory Visit: Payer: Self-pay | Admitting: Cardiovascular Disease

## 2021-12-07 DIAGNOSIS — R072 Precordial pain: Secondary | ICD-10-CM | POA: Diagnosis not present

## 2021-12-07 DIAGNOSIS — R931 Abnormal findings on diagnostic imaging of heart and coronary circulation: Secondary | ICD-10-CM | POA: Diagnosis not present

## 2021-12-07 DIAGNOSIS — I2510 Atherosclerotic heart disease of native coronary artery without angina pectoris: Secondary | ICD-10-CM | POA: Diagnosis not present

## 2021-12-07 MED ORDER — IOHEXOL 350 MG/ML SOLN
75.0000 mL | Freq: Once | INTRAVENOUS | Status: AC | PRN
  Administered 2021-12-07: 75 mL via INTRAVENOUS

## 2021-12-07 MED ORDER — NITROGLYCERIN 0.4 MG SL SUBL
0.8000 mg | SUBLINGUAL_TABLET | Freq: Once | SUBLINGUAL | Status: AC
  Administered 2021-12-07: 0.8 mg via SUBLINGUAL

## 2021-12-07 NOTE — Progress Notes (Signed)
Patient tolerated procedure well. Ambulate w/o difficulty. Denies any lightheadedness or being dizzy. Pt denies any pain at this time. Sitting in chair, drinking water provided. P is encouraged to drink additional water throughout the day and reason explained to patient. Patient verbalized understanding and all questions answered. ABC intact. No further needs at this time. Discharge from procedure area w/o issues.  °

## 2021-12-08 DIAGNOSIS — R931 Abnormal findings on diagnostic imaging of heart and coronary circulation: Secondary | ICD-10-CM | POA: Diagnosis not present

## 2021-12-08 DIAGNOSIS — I2510 Atherosclerotic heart disease of native coronary artery without angina pectoris: Secondary | ICD-10-CM | POA: Diagnosis not present

## 2021-12-11 ENCOUNTER — Encounter: Payer: Self-pay | Admitting: Cardiovascular Disease

## 2021-12-14 ENCOUNTER — Telehealth: Payer: Self-pay

## 2021-12-14 NOTE — Telephone Encounter (Signed)
Able to reach pt's wife Thayer Headings (DPR approved) regarding Mr. Wenger recent CCTA (pt currently not feeling well, with stomacg bug). Dr. Rockey Situ had a chance to review his results and advised   "Can we set up appt to discuss cardiac CTA  Report is concerning for blockage but we need to talk about results in detail  Thx  TGollan "  Thayer Headings very thankful for the phone call of her husband results, understands a f/u appt needed to review resutls in detail concerning for blockages, otherwise all questions and concerns were address with nothing further at this time. Will see at next schedule f/u appt 01/08/2022 with Dr. Rockey Situ

## 2021-12-22 ENCOUNTER — Other Ambulatory Visit: Payer: Self-pay | Admitting: Family Medicine

## 2021-12-22 DIAGNOSIS — M62830 Muscle spasm of back: Secondary | ICD-10-CM | POA: Diagnosis not present

## 2021-12-22 DIAGNOSIS — M9903 Segmental and somatic dysfunction of lumbar region: Secondary | ICD-10-CM | POA: Diagnosis not present

## 2021-12-22 DIAGNOSIS — I10 Essential (primary) hypertension: Secondary | ICD-10-CM

## 2021-12-22 DIAGNOSIS — M5136 Other intervertebral disc degeneration, lumbar region: Secondary | ICD-10-CM | POA: Diagnosis not present

## 2021-12-22 DIAGNOSIS — M9905 Segmental and somatic dysfunction of pelvic region: Secondary | ICD-10-CM | POA: Diagnosis not present

## 2021-12-29 DIAGNOSIS — D0439 Carcinoma in situ of skin of other parts of face: Secondary | ICD-10-CM | POA: Diagnosis not present

## 2022-01-05 DIAGNOSIS — M9903 Segmental and somatic dysfunction of lumbar region: Secondary | ICD-10-CM | POA: Diagnosis not present

## 2022-01-05 DIAGNOSIS — D0439 Carcinoma in situ of skin of other parts of face: Secondary | ICD-10-CM | POA: Diagnosis not present

## 2022-01-05 DIAGNOSIS — M62830 Muscle spasm of back: Secondary | ICD-10-CM | POA: Diagnosis not present

## 2022-01-05 DIAGNOSIS — M9905 Segmental and somatic dysfunction of pelvic region: Secondary | ICD-10-CM | POA: Diagnosis not present

## 2022-01-05 DIAGNOSIS — D044 Carcinoma in situ of skin of scalp and neck: Secondary | ICD-10-CM | POA: Diagnosis not present

## 2022-01-05 DIAGNOSIS — M5136 Other intervertebral disc degeneration, lumbar region: Secondary | ICD-10-CM | POA: Diagnosis not present

## 2022-01-08 ENCOUNTER — Other Ambulatory Visit: Payer: Self-pay

## 2022-01-08 ENCOUNTER — Encounter: Payer: Self-pay | Admitting: Cardiovascular Disease

## 2022-01-08 ENCOUNTER — Ambulatory Visit: Payer: PPO | Admitting: Cardiovascular Disease

## 2022-01-08 VITALS — BP 124/62 | HR 68 | Ht 70.0 in | Wt 194.1 lb

## 2022-01-08 DIAGNOSIS — R072 Precordial pain: Secondary | ICD-10-CM | POA: Diagnosis not present

## 2022-01-08 DIAGNOSIS — E7800 Pure hypercholesterolemia, unspecified: Secondary | ICD-10-CM | POA: Diagnosis not present

## 2022-01-08 DIAGNOSIS — Z79899 Other long term (current) drug therapy: Secondary | ICD-10-CM

## 2022-01-08 DIAGNOSIS — I209 Angina pectoris, unspecified: Secondary | ICD-10-CM

## 2022-01-08 DIAGNOSIS — R55 Syncope and collapse: Secondary | ICD-10-CM

## 2022-01-08 MED ORDER — ROSUVASTATIN CALCIUM 5 MG PO TABS: 5.0000 mg | ORAL_TABLET | Freq: Every day | ORAL | 3 refills | Status: DC

## 2022-01-08 MED ORDER — EZETIMIBE 10 MG PO TABS: 10.0000 mg | ORAL_TABLET | Freq: Every day | ORAL | 3 refills | Status: DC

## 2022-01-08 NOTE — Patient Instructions (Addendum)
Medication Instructions:  ?Please start zetia 10 mg daily ?Please start crestor 5 mg daily ? ?Read about plavix instead of asa  ? ?If you need a refill on your cardiac medications before your next appointment, please call your pharmacy.  ? ?Lab work: ?Lipids & LFTs 3 months ? ?Testing/Procedures: ?No new testing needed ? ?Follow-Up: ?At [facility], you and your health needs are our priority.  As part of our continuing mission to provide you with exceptional heart care, we have created designated Provider Care Teams.  These Care Teams include your primary Cardiologist (physician) and Advanced Practice Providers (APPs -  Physician Assistants and Nurse Practitioners) who all work together to provide you with the care you need, when you need it. ? ?You will need a follow up appointment in 12 months ? ?Providers on your designated Care Team:   ?Murray Hodgkins, NP ?Christell Faith, PA-C ?Cadence Kathlen Mody, PA-C ? ?COVID-19 Vaccine Information can be found at: [email or web address] For questions related to vaccine distribution or appointments, please email [email or web address] or call [phone].  ? ?

## 2022-01-08 NOTE — Progress Notes (Signed)
[facility] Office Note  Date:  01/08/2022   ID:  ZEVEN KOCAK, DOB [birth year], MRN [id number]  PCP:  Leone Haven, MD   Chief Complaint  Patient presents with   Follow up CT Coronary Scan     "Doing well." Medications reviewed by the patient verbally.     HPI:  Mr. Julian Pineda is a 76 year old gentleman with past medical history of Syncope   06/2016 , 12/2017, December 2022 Who presents for follow-up of his syncope, coronary artery disease  Details of his syncope episodes as detailed below  Cardiac CTA ordered on last clinic visit Images pulled up and reviewed Moderate mid LAD disease Severe greater than 70% proximal LAD disease FFR LAD 0.85  Recently recovering from norovirus  Reports he is active, does regular exercise, denies any anginal symptoms No further near syncope or syncope episodes  EKG personally reviewed by myself on todays visit Normal sinus rhythm rate 68 bpm no significant ST-T wave changes  Other past medical history reviewed events from October 19, 2021 Reports that he woke up, did not feel well that morning  Despite this went to the gym, did not have breakfast, not much fluid intake Working out at the gym had dizziness symptoms, Went to the bathroom bathroom, urination Was doing machine, worsening symptoms, developed syncope, was on a machine at the time EMTs called, Wife reports that he had second episode while on the gurney with EMTs  Hospital records reviewed  EMS reports initial blood pressure of 90 systolic that improved with 500 cc of IV fluids.  Prior episodes of syncope, August 2017 syncope 2 after playing 18 holes of golf in th heat Was dehydrated, CR 1.7, BUN 24   hospitalized from 01/27/18-01/29/18 for syncope.  Was out to eat, did not eat much, did not feel well he had had 3 alcoholic beverages   sitting in the car when he started to feel weak and lightheaded with a little bit of light sensitivity Vomited and  passed  out.   cold and clammy.  had not had much water to drink prior to the event.    EMS found his blood pressure to be 60/40.  They gave him fluid.   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  PSH:    Past Surgical History:  Procedure Laterality Date   ACHILLES TENDON SURGERY Right 1984   CHOLECYSTECTOMY N/A 09/02/2021   Procedure: LAPAROSCOPIC CHOLECYSTECTOMY;  Surgeon: Coralie Keens, MD;  Location: [facility];  Service: General;  Laterality: N/A;   COLONOSCOPY  2008   COLONOSCOPY WITH PROPOFOL N/A 11/09/2017   Procedure: COLONOSCOPY WITH PROPOFOL;  Surgeon: Manya Silvas, MD;  Location: [facility] ENDOSCOPY;  Service: Endoscopy;  Laterality: N/A;   MOHS SURGERY  09-25-2014;  11-27-2013   [facility]   09-25-2014 right lower eyelid /  11-27-2013  left lateral cheek   RADIOACTIVE SEED IMPLANT N/A 06/17/2017   Procedure: RADIOACTIVE SEED IMPLANT/BRACHYTHERAPY IMPLANT;  Surgeon: Festus Aloe, MD;  Location: [facility];  Service: Urology;  Laterality: N/A;  TONSILLECTOMY  child   TOTAL HIP ARTHROPLASTY Left 09/29/2021   Procedure: TOTAL HIP ARTHROPLASTY ANTERIOR APPROACH;  Surgeon: Paralee Cancel, MD;  Location: [facility];  Service: Orthopedics;  Laterality: Left;   TOTAL KNEE ARTHROPLASTY  12/04/2012   Procedure: TOTAL KNEE ARTHROPLASTY;  Surgeon: Vickey Huger, MD;  Location: [place];  Service: Orthopedics;  Laterality: Left;  left total knee arthroplasty   VARICOSE VEIN SURGERY  2000    left leg    Current Outpatient Medications  Medication Sig Dispense Refill   acetaminophen (TYLENOL) 650 MG suppository Place 650 mg rectally every 4 (four) hours as needed.     amLODipine (NORVASC) 5 MG tablet TAKE 1 TABLET BY MOUTH ONCE DAILY 90 tablet 3   Ascorbic Acid (VITAMIN C) 1000 MG tablet Take 1,000 mg by mouth daily.     cholecalciferol (VITAMIN D) 1000 units tablet Take 1,000 Units by mouth every morning.      ezetimibe (ZETIA) 10 MG tablet Take 1 tablet (10 mg total) by mouth daily. 90 tablet 3   folic acid (FOLVITE) 1 MG tablet TAKE 1 TABLET EVERY DAY 90 tablet 0   losartan (COZAAR) 100 MG tablet TAKE 1 TABLET BY MOUTH DAILY 90 tablet 1   meloxicam (MOBIC) 15 MG tablet TAKE 1 TABLET BY MOUTH ONCE DAILY WITH FOOD 30 tablet 2   methotrexate 50 MG/2ML injection INJECT 1ML WEEKLY (Patient taking differently: Inject 25 mg into the muscle every Wednesday.) 4 mL 6   metroNIDAZOLE (METROGEL) 0.75 % gel Apply topically 2 (two) times daily.     pantoprazole (PROTONIX) 40 MG tablet Take 40 mg by mouth daily.     rosuvastatin (CRESTOR) 5 MG tablet Take 1 tablet (5 mg total) by mouth daily. 90 tablet 3   sildenafil (VIAGRA) 100 MG tablet Take 100 mg by mouth daily as needed.     meloxicam (MOBIC) 15 MG tablet Take 1 tablet by mouth as needed. (Patient not taking: Reported on 01/08/2022)     metoprolol tartrate (LOPRESSOR) 100 MG tablet Take one tablet ('100MG'$ ) by mouth 2 hours prior to your CT (Patient not taking: Reported on 01/08/2022) 1 tablet 0   metroNIDAZOLE (METROCREAM) 0.75 % cream Apply 1 application topically daily.  (Patient not taking: Reported on 01/08/2022)     ondansetron (ZOFRAN ODT) 4 MG disintegrating tablet Take 1 tablet (4 mg total) by mouth every 8 (eight) hours as needed for nausea or vomiting. (Patient not taking: Reported on 01/08/2022) 20 tablet 0   No current facility-administered medications for this visit.     Allergies:   Penicillins   Social History:  The  patient  reports that he has never smoked. He quit smokeless tobacco use about 5 years ago.  His smokeless tobacco use included chew. He reports current alcohol use of about 14.0 standard drinks per week. He reports that he does not use drugs.   Family History:   family history includes Alcohol abuse in his father; Arthritis in his father; Breast cancer in his daughter; Cancer in his mother; Prostate cancer in his brother.    Review of Systems: Review of Systems  Constitutional: Negative.   HENT: Negative.    Respiratory: Negative.    Cardiovascular: Negative.   Gastrointestinal: Negative.   Musculoskeletal: Negative.   Neurological: Negative.   Psychiatric/Behavioral: Negative.    All other systems reviewed and are negative.   PHYSICAL EXAM: VS:  BP 124/62 (BP Location: Left Arm, Patient Position: Sitting, Cuff Size: Normal)  Pulse 68    Ht '5\' 10"'$  (1.778 m)    Wt 194 lb 2 oz (88.1 kg)    SpO2 98%    BMI 27.85 kg/m  , BMI Body mass index is 27.85 kg/m. Constitutional:  oriented to person, place, and time. No distress.  HENT:  Head: Grossly normal Eyes:  no discharge. No scleral icterus.  Neck: No JVD, no carotid bruits  Cardiovascular: Regular rate and rhythm, no murmurs appreciated Pulmonary/Chest: Clear to auscultation bilaterally, no wheezes or rails Abdominal: Soft.  no distension.  no tenderness.  Musculoskeletal: Normal range of motion Neurological:  normal muscle tone. Coordination normal. No atrophy Skin: Skin warm and dry Psychiatric: normal affect, pleasant  Recent Labs: 10/19/2021: Hemoglobin 11.5; Platelets 247 11/20/2021: BUN 14; Creatinine, Ser 1.04; Potassium 4.6; Sodium 141    Lipid Panel Lab Results  Component Value Date   CHOL 198 09/26/2019   HDL 79.50 09/26/2019   LDLCALC 106 (H) 09/26/2019   TRIG 62.0 09/26/2019      Wt Readings from Last 3 Encounters:  01/08/22 194 lb 2 oz (88.1 kg)  11/20/21 197 lb (89.4 kg)  10/19/21 194 lb 7.1 oz (88.2  kg)     ASSESSMENT AND PLAN:  Coronary artery disease with stable angina Cardiac CTA images pulled up and reviewed Denies anginal symptoms, Discussed various treatment options including invasive cardiac catheterization and possible stent placement, discussed risk and benefits Also discussed medical management with aggressive lipid modification He prefers no cardiac catheterization at this time Discussed symptoms to watch for, suggested he call us for any shortness of breath, chest discomfort concerning for angina  Hyperlipidemia Goal LDL less than 60, recommended he start Crestor 5 mg daily with Zetia 10 mg daily We will start with low-dose Crestor given history of rheumatoid arthritis  Syncope, unspecified syncope type   2 episodes several years ago possible vasovagal etiology, 1 with dehydration, 1 possible alcohol involvement with poor fluid intake --Most recent episode reported having no breakfast or drink went to the gym to workout, had orthostasis symptoms After urinating, had near syncope then syncope, systolic pressure 90 with EMTs -Symptoms improved with IV hydration Cardiac CTA work-up done as above Recommend he call us for any further episodes  Malignant neoplasm of prostate ([place]) Managed by primary care  Essential hypertension Blood pressure is well controlled on today's visit. No changes made to the medications.    Total encounter time more than 40 minutes  Greater than 50% was spent in counseling and coordination of care with the patient    Orders Placed This Encounter  Procedures   Lipid panel   Hepatic function panel   EKG 12-Lead     Signed, Esmond Plants, M.D., Ph.D. 01/08/2022  [place], [place]

## 2022-01-12 DIAGNOSIS — D045 Carcinoma in situ of skin of trunk: Secondary | ICD-10-CM | POA: Diagnosis not present

## 2022-01-12 DIAGNOSIS — D0471 Carcinoma in situ of skin of right lower limb, including hip: Secondary | ICD-10-CM | POA: Diagnosis not present

## 2022-01-18 ENCOUNTER — Other Ambulatory Visit: Payer: Self-pay | Admitting: Family Medicine

## 2022-01-19 DIAGNOSIS — M9903 Segmental and somatic dysfunction of lumbar region: Secondary | ICD-10-CM | POA: Diagnosis not present

## 2022-01-19 DIAGNOSIS — M5136 Other intervertebral disc degeneration, lumbar region: Secondary | ICD-10-CM | POA: Diagnosis not present

## 2022-01-19 DIAGNOSIS — M62830 Muscle spasm of back: Secondary | ICD-10-CM | POA: Diagnosis not present

## 2022-01-19 DIAGNOSIS — M9905 Segmental and somatic dysfunction of pelvic region: Secondary | ICD-10-CM | POA: Diagnosis not present

## 2022-01-25 DIAGNOSIS — R52 Pain, unspecified: Secondary | ICD-10-CM | POA: Diagnosis not present

## 2022-01-25 DIAGNOSIS — G5603 Carpal tunnel syndrome, bilateral upper limbs: Secondary | ICD-10-CM | POA: Diagnosis not present

## 2022-01-25 DIAGNOSIS — M79641 Pain in right hand: Secondary | ICD-10-CM | POA: Diagnosis not present

## 2022-01-25 DIAGNOSIS — M13831 Other specified arthritis, right wrist: Secondary | ICD-10-CM | POA: Diagnosis not present

## 2022-01-25 DIAGNOSIS — G5601 Carpal tunnel syndrome, right upper limb: Secondary | ICD-10-CM | POA: Diagnosis not present

## 2022-01-25 DIAGNOSIS — G5602 Carpal tunnel syndrome, left upper limb: Secondary | ICD-10-CM | POA: Diagnosis not present

## 2022-01-25 DIAGNOSIS — M79642 Pain in left hand: Secondary | ICD-10-CM | POA: Diagnosis not present

## 2022-02-02 DIAGNOSIS — M9905 Segmental and somatic dysfunction of pelvic region: Secondary | ICD-10-CM | POA: Diagnosis not present

## 2022-02-02 DIAGNOSIS — M9903 Segmental and somatic dysfunction of lumbar region: Secondary | ICD-10-CM | POA: Diagnosis not present

## 2022-02-02 DIAGNOSIS — M5136 Other intervertebral disc degeneration, lumbar region: Secondary | ICD-10-CM | POA: Diagnosis not present

## 2022-02-02 DIAGNOSIS — M62830 Muscle spasm of back: Secondary | ICD-10-CM | POA: Diagnosis not present

## 2022-02-05 DIAGNOSIS — G5603 Carpal tunnel syndrome, bilateral upper limbs: Secondary | ICD-10-CM | POA: Diagnosis not present

## 2022-02-09 DIAGNOSIS — M0589 Other rheumatoid arthritis with rheumatoid factor of multiple sites: Secondary | ICD-10-CM | POA: Diagnosis not present

## 2022-02-10 DIAGNOSIS — M79642 Pain in left hand: Secondary | ICD-10-CM | POA: Diagnosis not present

## 2022-02-10 DIAGNOSIS — M79641 Pain in right hand: Secondary | ICD-10-CM | POA: Diagnosis not present

## 2022-02-10 DIAGNOSIS — G5603 Carpal tunnel syndrome, bilateral upper limbs: Secondary | ICD-10-CM | POA: Diagnosis not present

## 2022-02-16 DIAGNOSIS — M62830 Muscle spasm of back: Secondary | ICD-10-CM | POA: Diagnosis not present

## 2022-02-16 DIAGNOSIS — K449 Diaphragmatic hernia without obstruction or gangrene: Secondary | ICD-10-CM | POA: Diagnosis not present

## 2022-02-16 DIAGNOSIS — K219 Gastro-esophageal reflux disease without esophagitis: Secondary | ICD-10-CM | POA: Diagnosis not present

## 2022-02-16 DIAGNOSIS — M5136 Other intervertebral disc degeneration, lumbar region: Secondary | ICD-10-CM | POA: Diagnosis not present

## 2022-02-16 DIAGNOSIS — K2270 Barrett's esophagus without dysplasia: Secondary | ICD-10-CM | POA: Diagnosis not present

## 2022-02-16 DIAGNOSIS — Z9049 Acquired absence of other specified parts of digestive tract: Secondary | ICD-10-CM | POA: Diagnosis not present

## 2022-02-16 DIAGNOSIS — R142 Eructation: Secondary | ICD-10-CM | POA: Diagnosis not present

## 2022-02-16 DIAGNOSIS — M9905 Segmental and somatic dysfunction of pelvic region: Secondary | ICD-10-CM | POA: Diagnosis not present

## 2022-02-16 DIAGNOSIS — M9903 Segmental and somatic dysfunction of lumbar region: Secondary | ICD-10-CM | POA: Diagnosis not present

## 2022-02-16 DIAGNOSIS — R110 Nausea: Secondary | ICD-10-CM | POA: Diagnosis not present

## 2022-02-23 DIAGNOSIS — D045 Carcinoma in situ of skin of trunk: Secondary | ICD-10-CM | POA: Diagnosis not present

## 2022-02-23 DIAGNOSIS — D2272 Melanocytic nevi of left lower limb, including hip: Secondary | ICD-10-CM | POA: Diagnosis not present

## 2022-02-23 DIAGNOSIS — D2262 Melanocytic nevi of left upper limb, including shoulder: Secondary | ICD-10-CM | POA: Diagnosis not present

## 2022-02-23 DIAGNOSIS — C44329 Squamous cell carcinoma of skin of other parts of face: Secondary | ICD-10-CM | POA: Diagnosis not present

## 2022-02-23 DIAGNOSIS — D225 Melanocytic nevi of trunk: Secondary | ICD-10-CM | POA: Diagnosis not present

## 2022-02-23 DIAGNOSIS — D0462 Carcinoma in situ of skin of left upper limb, including shoulder: Secondary | ICD-10-CM | POA: Diagnosis not present

## 2022-02-23 DIAGNOSIS — D485 Neoplasm of uncertain behavior of skin: Secondary | ICD-10-CM | POA: Diagnosis not present

## 2022-02-23 DIAGNOSIS — Z85828 Personal history of other malignant neoplasm of skin: Secondary | ICD-10-CM | POA: Diagnosis not present

## 2022-02-23 DIAGNOSIS — L570 Actinic keratosis: Secondary | ICD-10-CM | POA: Diagnosis not present

## 2022-03-02 DIAGNOSIS — M9903 Segmental and somatic dysfunction of lumbar region: Secondary | ICD-10-CM | POA: Diagnosis not present

## 2022-03-02 DIAGNOSIS — M5136 Other intervertebral disc degeneration, lumbar region: Secondary | ICD-10-CM | POA: Diagnosis not present

## 2022-03-02 DIAGNOSIS — M9905 Segmental and somatic dysfunction of pelvic region: Secondary | ICD-10-CM | POA: Diagnosis not present

## 2022-03-02 DIAGNOSIS — M62830 Muscle spasm of back: Secondary | ICD-10-CM | POA: Diagnosis not present

## 2022-03-09 DIAGNOSIS — C44329 Squamous cell carcinoma of skin of other parts of face: Secondary | ICD-10-CM | POA: Diagnosis not present

## 2022-03-09 DIAGNOSIS — D0439 Carcinoma in situ of skin of other parts of face: Secondary | ICD-10-CM | POA: Diagnosis not present

## 2022-03-16 DIAGNOSIS — M9903 Segmental and somatic dysfunction of lumbar region: Secondary | ICD-10-CM | POA: Diagnosis not present

## 2022-03-16 DIAGNOSIS — D045 Carcinoma in situ of skin of trunk: Secondary | ICD-10-CM | POA: Diagnosis not present

## 2022-03-16 DIAGNOSIS — M9905 Segmental and somatic dysfunction of pelvic region: Secondary | ICD-10-CM | POA: Diagnosis not present

## 2022-03-16 DIAGNOSIS — M62830 Muscle spasm of back: Secondary | ICD-10-CM | POA: Diagnosis not present

## 2022-03-16 DIAGNOSIS — M5136 Other intervertebral disc degeneration, lumbar region: Secondary | ICD-10-CM | POA: Diagnosis not present

## 2022-03-16 DIAGNOSIS — D0462 Carcinoma in situ of skin of left upper limb, including shoulder: Secondary | ICD-10-CM | POA: Diagnosis not present

## 2022-03-18 DIAGNOSIS — G5601 Carpal tunnel syndrome, right upper limb: Secondary | ICD-10-CM | POA: Diagnosis not present

## 2022-03-24 DIAGNOSIS — G5602 Carpal tunnel syndrome, left upper limb: Secondary | ICD-10-CM | POA: Diagnosis not present

## 2022-03-24 DIAGNOSIS — S63642D Sprain of metacarpophalangeal joint of left thumb, subsequent encounter: Secondary | ICD-10-CM | POA: Diagnosis not present

## 2022-03-30 DIAGNOSIS — M9905 Segmental and somatic dysfunction of pelvic region: Secondary | ICD-10-CM | POA: Diagnosis not present

## 2022-03-30 DIAGNOSIS — M62830 Muscle spasm of back: Secondary | ICD-10-CM | POA: Diagnosis not present

## 2022-03-30 DIAGNOSIS — M9903 Segmental and somatic dysfunction of lumbar region: Secondary | ICD-10-CM | POA: Diagnosis not present

## 2022-03-30 DIAGNOSIS — M5136 Other intervertebral disc degeneration, lumbar region: Secondary | ICD-10-CM | POA: Diagnosis not present

## 2022-03-31 DIAGNOSIS — M25631 Stiffness of right wrist, not elsewhere classified: Secondary | ICD-10-CM | POA: Diagnosis not present

## 2022-04-13 DIAGNOSIS — M9905 Segmental and somatic dysfunction of pelvic region: Secondary | ICD-10-CM | POA: Diagnosis not present

## 2022-04-13 DIAGNOSIS — M5136 Other intervertebral disc degeneration, lumbar region: Secondary | ICD-10-CM | POA: Diagnosis not present

## 2022-04-13 DIAGNOSIS — M62830 Muscle spasm of back: Secondary | ICD-10-CM | POA: Diagnosis not present

## 2022-04-13 DIAGNOSIS — M9903 Segmental and somatic dysfunction of lumbar region: Secondary | ICD-10-CM | POA: Diagnosis not present

## 2022-04-14 ENCOUNTER — Other Ambulatory Visit
Admission: RE | Admit: 2022-04-14 | Discharge: 2022-04-14 | Disposition: A | Discharge status: Home / Self Care | Payer: PPO | Attending: Cardiovascular Disease | Admitting: Cardiovascular Disease

## 2022-04-14 DIAGNOSIS — Z79899 Other long term (current) drug therapy: Secondary | ICD-10-CM

## 2022-04-14 DIAGNOSIS — E7800 Pure hypercholesterolemia, unspecified: Secondary | ICD-10-CM

## 2022-04-14 LAB — HEPATIC FUNCTION PANEL
ALT: 28 U/L (ref 0–44)
AST: 26 U/L (ref 15–41)
Albumin: 4 g/dL (ref 3.5–5.0)
Alkaline Phosphatase: 49 U/L (ref 38–126)
Bilirubin, Direct: 0.2 mg/dL (ref 0.0–0.2)
Indirect Bilirubin: 1.2 mg/dL — ABNORMAL HIGH (ref 0.3–0.9)
Total Bilirubin: 1.4 mg/dL — ABNORMAL HIGH (ref 0.3–1.2)
Total Protein: 6.7 g/dL (ref 6.5–8.1)

## 2022-04-14 LAB — LIPID PANEL
Cholesterol: 153 mg/dL (ref 0–200)
HDL: 88 mg/dL (ref >40)
LDL Cholesterol: 51 mg/dL (ref 0–99)
Total CHOL/HDL Ratio: 1.7 RATIO
Triglycerides: 68 mg/dL (ref <150)
VLDL: 14 mg/dL (ref 0–40)

## 2022-04-19 ENCOUNTER — Telehealth: Payer: Self-pay | Admitting: Emergency Medicine

## 2022-04-19 NOTE — Telephone Encounter (Signed)
Called patient to go over results. No answer. Detailed message left per DPR.

## 2022-04-19 NOTE — Telephone Encounter (Signed)
-----   Message from Minna Merritts, MD sent at 04/18/2022  9:14 PM EDT ----- Much improved cholesterol numbers now 150 previously 198

## 2022-04-26 DIAGNOSIS — G5602 Carpal tunnel syndrome, left upper limb: Secondary | ICD-10-CM | POA: Diagnosis not present

## 2022-04-26 HISTORY — PX: CARPAL TUNNEL RELEASE: SHX101

## 2022-04-27 DIAGNOSIS — M5136 Other intervertebral disc degeneration, lumbar region: Secondary | ICD-10-CM | POA: Diagnosis not present

## 2022-04-27 DIAGNOSIS — M9903 Segmental and somatic dysfunction of lumbar region: Secondary | ICD-10-CM | POA: Diagnosis not present

## 2022-04-27 DIAGNOSIS — M9905 Segmental and somatic dysfunction of pelvic region: Secondary | ICD-10-CM | POA: Diagnosis not present

## 2022-04-27 DIAGNOSIS — M62830 Muscle spasm of back: Secondary | ICD-10-CM | POA: Diagnosis not present

## 2022-05-05 ENCOUNTER — Emergency Department: Payer: PPO

## 2022-05-05 ENCOUNTER — Observation Stay
Admission: EM | Admit: 2022-05-05 | Discharge: 2022-05-06 | Disposition: A | Discharge status: Home / Self Care | Payer: PPO | Attending: Internal Medicine | Admitting: Internal Medicine

## 2022-05-05 DIAGNOSIS — Z87891 Personal history of nicotine dependence: Secondary | ICD-10-CM | POA: Diagnosis not present

## 2022-05-05 DIAGNOSIS — E785 Hyperlipidemia, unspecified: Secondary | ICD-10-CM | POA: Diagnosis present

## 2022-05-05 DIAGNOSIS — E860 Dehydration: Secondary | ICD-10-CM | POA: Diagnosis not present

## 2022-05-05 DIAGNOSIS — K2270 Barrett's esophagus without dysplasia: Secondary | ICD-10-CM | POA: Diagnosis not present

## 2022-05-05 DIAGNOSIS — R945 Abnormal results of liver function studies: Secondary | ICD-10-CM | POA: Insufficient documentation

## 2022-05-05 DIAGNOSIS — I10 Essential (primary) hypertension: Secondary | ICD-10-CM | POA: Insufficient documentation

## 2022-05-05 DIAGNOSIS — N179 Acute kidney failure, unspecified: Secondary | ICD-10-CM | POA: Diagnosis not present

## 2022-05-05 DIAGNOSIS — Z96642 Presence of left artificial hip joint: Secondary | ICD-10-CM | POA: Diagnosis not present

## 2022-05-05 DIAGNOSIS — K760 Fatty (change of) liver, not elsewhere classified: Secondary | ICD-10-CM | POA: Diagnosis not present

## 2022-05-05 DIAGNOSIS — M069 Rheumatoid arthritis, unspecified: Secondary | ICD-10-CM | POA: Diagnosis present

## 2022-05-05 DIAGNOSIS — Z96652 Presence of left artificial knee joint: Secondary | ICD-10-CM | POA: Diagnosis not present

## 2022-05-05 DIAGNOSIS — K5730 Diverticulosis of large intestine without perforation or abscess without bleeding: Secondary | ICD-10-CM | POA: Diagnosis not present

## 2022-05-05 DIAGNOSIS — R1110 Vomiting, unspecified: Secondary | ICD-10-CM | POA: Diagnosis not present

## 2022-05-05 DIAGNOSIS — Z85828 Personal history of other malignant neoplasm of skin: Secondary | ICD-10-CM | POA: Diagnosis not present

## 2022-05-05 DIAGNOSIS — Z8546 Personal history of malignant neoplasm of prostate: Secondary | ICD-10-CM | POA: Insufficient documentation

## 2022-05-05 DIAGNOSIS — R42 Dizziness and giddiness: Secondary | ICD-10-CM | POA: Diagnosis not present

## 2022-05-05 DIAGNOSIS — Q8909 Congenital malformations of spleen: Secondary | ICD-10-CM | POA: Diagnosis not present

## 2022-05-05 DIAGNOSIS — R55 Syncope and collapse: Secondary | ICD-10-CM | POA: Diagnosis not present

## 2022-05-05 DIAGNOSIS — Z79899 Other long term (current) drug therapy: Secondary | ICD-10-CM | POA: Diagnosis not present

## 2022-05-05 DIAGNOSIS — F1011 Alcohol abuse, in remission: Secondary | ICD-10-CM | POA: Diagnosis present

## 2022-05-05 DIAGNOSIS — R7989 Other specified abnormal findings of blood chemistry: Secondary | ICD-10-CM | POA: Diagnosis present

## 2022-05-05 DIAGNOSIS — I959 Hypotension, unspecified: Secondary | ICD-10-CM | POA: Diagnosis not present

## 2022-05-05 DIAGNOSIS — Z789 Other specified health status: Secondary | ICD-10-CM | POA: Diagnosis not present

## 2022-05-05 DIAGNOSIS — R110 Nausea: Secondary | ICD-10-CM | POA: Diagnosis not present

## 2022-05-05 LAB — COMPREHENSIVE METABOLIC PANEL
ALT: 206 U/L — ABNORMAL HIGH (ref 0–44)
AST: 226 U/L — ABNORMAL HIGH (ref 15–41)
Albumin: 3.3 g/dL — ABNORMAL LOW (ref 3.5–5.0)
Alkaline Phosphatase: 124 U/L (ref 38–126)
Anion gap: 9 (ref 5–15)
BUN: 20 mg/dL (ref 8–23)
CO2: 21 mmol/L — ABNORMAL LOW (ref 22–32)
Calcium: 8.1 mg/dL — ABNORMAL LOW (ref 8.9–10.3)
Chloride: 104 mmol/L (ref 98–111)
Creatinine, Ser: 1.47 mg/dL — ABNORMAL HIGH (ref 0.61–1.24)
GFR, Estimated: 50 mL/min — ABNORMAL LOW (ref >60)
Glucose, Bld: 108 mg/dL — ABNORMAL HIGH (ref 70–99)
Potassium: 4.3 mmol/L (ref 3.5–5.1)
Sodium: 134 mmol/L — ABNORMAL LOW (ref 135–145)
Total Bilirubin: 0.8 mg/dL (ref 0.3–1.2)
Total Protein: 6.1 g/dL — ABNORMAL LOW (ref 6.5–8.1)

## 2022-05-05 LAB — URINE DRUG SCREEN, QUALITATIVE (ARMC ONLY)
Amphetamines, Ur Screen: NOT DETECTED
Barbiturates, Ur Screen: NOT DETECTED
Benzodiazepine, Ur Scrn: NOT DETECTED
Cannabinoid 50 Ng, Ur ~~LOC~~: NOT DETECTED
Cocaine Metabolite,Ur ~~LOC~~: NOT DETECTED
MDMA (Ecstasy)Ur Screen: NOT DETECTED
Methadone Scn, Ur: NOT DETECTED
Opiate, Ur Screen: NOT DETECTED
Phencyclidine (PCP) Ur S: NOT DETECTED
Tricyclic, Ur Screen: NOT DETECTED

## 2022-05-05 LAB — URINALYSIS, ROUTINE W REFLEX MICROSCOPIC
Bilirubin Urine: NEGATIVE
Glucose, UA: NEGATIVE mg/dL
Hgb urine dipstick: NEGATIVE
Ketones, ur: 5 mg/dL — AB
Leukocytes,Ua: NEGATIVE
Nitrite: NEGATIVE
Protein, ur: NEGATIVE mg/dL
Specific Gravity, Urine: 1.04 — ABNORMAL HIGH (ref 1.005–1.030)
pH: 5 (ref 5.0–8.0)

## 2022-05-05 LAB — TROPONIN I (HIGH SENSITIVITY)
Troponin I (High Sensitivity): 13 ng/L (ref <18)
Troponin I (High Sensitivity): 11 ng/L (ref <18)

## 2022-05-05 LAB — D-DIMER, QUANTITATIVE: D-Dimer, Quant: 1.85 ug/mL-FEU — ABNORMAL HIGH (ref 0.00–0.50)

## 2022-05-05 MED ORDER — AMLODIPINE BESYLATE 5 MG PO TABS
5.0000 mg | ORAL_TABLET | Freq: Every day | ORAL | Status: DC
  Administered 2022-05-05 – 2022-05-06 (×2): 5 mg via ORAL
  Filled 2022-05-05 (×2): qty 1

## 2022-05-05 MED ORDER — IOHEXOL 350 MG/ML SOLN
100.0000 mL | Freq: Once | INTRAVENOUS | Status: AC | PRN
  Administered 2022-05-05: 100 mL via INTRAVENOUS

## 2022-05-05 MED ORDER — EZETIMIBE 10 MG PO TABS
10.0000 mg | ORAL_TABLET | Freq: Every day | ORAL | Status: DC
  Administered 2022-05-05 – 2022-05-06 (×2): 10 mg via ORAL
  Filled 2022-05-05 (×2): qty 1

## 2022-05-05 MED ORDER — SODIUM CHLORIDE 0.9 % IV BOLUS
1000.0000 mL | Freq: Once | INTRAVENOUS | Status: AC
  Administered 2022-05-05: 1000 mL via INTRAVENOUS

## 2022-05-05 MED ORDER — ROSUVASTATIN CALCIUM 10 MG PO TABS
5.0000 mg | ORAL_TABLET | Freq: Every day | ORAL | Status: DC
  Administered 2022-05-05 – 2022-05-06 (×2): 5 mg via ORAL
  Filled 2022-05-05 (×2): qty 1

## 2022-05-05 MED ORDER — LORAZEPAM 2 MG/ML IJ SOLN: 0.0000 mg | Freq: Four times a day (QID) | INTRAMUSCULAR | Status: DC

## 2022-05-05 MED ORDER — THIAMINE HCL 100 MG/ML IJ SOLN
100.0000 mg | Freq: Every day | INTRAMUSCULAR | Status: DC
  Filled 2022-05-05: qty 2

## 2022-05-05 MED ORDER — FOLIC ACID 1 MG PO TABS
1.0000 mg | ORAL_TABLET | Freq: Every day | ORAL | Status: DC
  Administered 2022-05-05 – 2022-05-06 (×2): 1 mg via ORAL
  Filled 2022-05-05 (×2): qty 1

## 2022-05-05 MED ORDER — LORAZEPAM 1 MG PO TABS: 1.0000 mg | ORAL_TABLET | ORAL | Status: DC | PRN

## 2022-05-05 MED ORDER — ONDANSETRON HCL 4 MG/2ML IJ SOLN: 4.0000 mg | Freq: Three times a day (TID) | INTRAMUSCULAR | Status: DC | PRN

## 2022-05-05 MED ORDER — PANTOPRAZOLE SODIUM 40 MG PO TBEC
40.0000 mg | DELAYED_RELEASE_TABLET | Freq: Every day | ORAL | Status: DC
  Administered 2022-05-05 – 2022-05-06 (×2): 40 mg via ORAL
  Filled 2022-05-05 (×2): qty 1

## 2022-05-05 MED ORDER — LORAZEPAM 2 MG/ML IJ SOLN: 0.0000 mg | Freq: Two times a day (BID) | INTRAMUSCULAR | Status: DC

## 2022-05-05 MED ORDER — VITAMIN D 25 MCG (1000 UNIT) PO TABS
1000.0000 [IU] | ORAL_TABLET | ORAL | Status: DC
  Administered 2022-05-06: 1000 [IU] via ORAL
  Filled 2022-05-05: qty 1

## 2022-05-05 MED ORDER — THIAMINE HCL 100 MG PO TABS
100.0000 mg | ORAL_TABLET | Freq: Every day | ORAL | Status: DC
  Administered 2022-05-05 – 2022-05-06 (×2): 100 mg via ORAL
  Filled 2022-05-05 (×2): qty 1

## 2022-05-05 MED ORDER — SODIUM CHLORIDE 0.9 % IV SOLN: INTRAVENOUS | Status: DC

## 2022-05-05 MED ORDER — ENOXAPARIN SODIUM 40 MG/0.4ML IJ SOSY
40.0000 mg | PREFILLED_SYRINGE | INTRAMUSCULAR | Status: DC
  Administered 2022-05-05: 40 mg via SUBCUTANEOUS
  Filled 2022-05-05: qty 0.4

## 2022-05-05 MED ORDER — TRAMADOL HCL 50 MG PO TABS
50.0000 mg | ORAL_TABLET | Freq: Four times a day (QID) | ORAL | Status: DC | PRN
  Administered 2022-05-05: 50 mg via ORAL
  Filled 2022-05-05: qty 1

## 2022-05-05 MED ORDER — ASCORBIC ACID 500 MG PO TABS
1000.0000 mg | ORAL_TABLET | Freq: Every day | ORAL | Status: DC
  Administered 2022-05-05 – 2022-05-06 (×2): 1000 mg via ORAL
  Filled 2022-05-05 (×2): qty 2

## 2022-05-05 MED ORDER — HYDRALAZINE HCL 20 MG/ML IJ SOLN: 5.0000 mg | INTRAMUSCULAR | Status: DC | PRN

## 2022-05-05 MED ORDER — ADULT MULTIVITAMIN W/MINERALS CH
1.0000 | ORAL_TABLET | Freq: Every day | ORAL | Status: DC
  Administered 2022-05-05 – 2022-05-06 (×2): 1 via ORAL
  Filled 2022-05-05 (×2): qty 1

## 2022-05-05 MED ORDER — LORAZEPAM 2 MG/ML IJ SOLN: 1.0000 mg | INTRAMUSCULAR | Status: DC | PRN

## 2022-05-05 NOTE — Assessment & Plan Note (Signed)
-   IV hydralazine as needed -Continue home amlodipine -Hold Cozaar due to AKI

## 2022-05-05 NOTE — Assessment & Plan Note (Addendum)
Likely due to dehydration -IV fluid as above -Avoid using renal toxic medications -Hold off Mobic and Cozaar

## 2022-05-05 NOTE — Assessment & Plan Note (Signed)
Likely due to alcohol abuse -Avoid using Tylenol -Check hepatitis panel

## 2022-05-05 NOTE — Assessment & Plan Note (Signed)
Patient is not taking methotrexate currently -As needed tramadol for pain (patient cannot use Tylenol due to abnormal liver function, cannot use NSAIDs due to AKI)

## 2022-05-05 NOTE — Assessment & Plan Note (Signed)
-  f/u lower extremity venous Doppler to rule out DVT

## 2022-05-05 NOTE — H&P (Signed)
History and Physical    Julian Pineda:[id number] DOB: July 31, 1946 DOA: 05/05/2022  Referring MD/NP/PA:   PCP: Leone Haven, MD   Patient coming from:  The patient is coming from home.    Chief Complaint: Syncope  HPI: Julian Pineda is a 76 y.o. male with medical history significant of hypertension, hyperlipidemia, GERD, rheumatoid arthritis, prostate cancer (s/p of radiation seeds placement), skin cancer, alcohol abuse, syncope, who presents with syncope  Patient states that he has not been feeling well for about 2 days, he has poor appetite, decreased oral intake, feels lightheaded. His wife states patient passed out for about 1 to 2 seconds today. He denies unilateral numbness or tinglings in extremities.  No facial droop or slurred speech.  Denies chest pain, cough, shortness of breath.  Patient has some nausea, no vomiting, diarrhea or abdominal pain.  Denies symptoms of UTI.  Data reviewed independently and ED Course: pt was found to have AKI with creatinine 1.47, BUN 21, GFR 20 ( recent baseline creatinine 1.04 on 11/20/2021), abnormal liver function (ALP 124, AST 266, ALT 206, total bilirubin 0.8), troponin level 13, D-dimer +1.85, temperature normal, blood pressure 125/72, heart rate 88, RR 24, oxygen saturation 99% on room air.  Chest x-ray negative.  CT angiogram is negative for PE.  CT abdomen/pelvis is negative for acute intra-abdominal issues, but showed hepatic steatosis.  Patient is placed on telemetry bed for observation   EKG: I have personally reviewed.  Sinus rhythm, QTc 401, no ischemic change   Review of Systems:   General: no fevers, chills, no body weight gain, has poor appetite, has fatigue HEENT: no blurry vision, hearing changes or sore throat Respiratory: no dyspnea, coughing, wheezing CV: no chest pain, no palpitations GI: has nausea, no vomiting, abdominal pain, diarrhea, constipation GU: no dysuria, burning on urination, increased urinary  frequency, hematuria  Ext: no leg edema Neuro: no unilateral weakness, numbness, or tingling, no vision change or hearing loss. Has syncope Skin: no rash, no skin tear. MSK: No muscle spasm, no deformity, no limitation of range of movement in spin Heme: No easy bruising.  Travel history: No recent long distant travel.   Allergy:  Allergies  Allergen Reactions   Penicillins Other (See Comments)    Unknown childhood reaction Tolerated 2 grams of ancef 09/29/2021 without problem    Past Medical History:  Diagnosis Date   Arthritis, rheumatoid (HCC)    Diverticulosis of colon    GERD (gastroesophageal reflux disease)    "occas"   History of basal cell carcinoma (BCC) excision    09-25-2014  right lower eyelid s/p moh's dx /  left lower eyelid scheduled removal 07-05-2017   History of squamous cell carcinoma excision    01/ 2015  left lateral cheek  s/p  moh's sx   Hypertension    sees Dr. Jackalyn Lombard, [place]   Nocturia    OA (osteoarthritis)    Prostate cancer [facility]) urologist-  dr eskidge/  oncologist- dr [name]   dx 04/ 2018-- Stage T1c, Gleason 3+3,  PSA 7.1,  vol 38.6cc   Rheumatoid arteritis ([place])    rheumotologist-   dr Elpidio Galea   Rosacea    Dr. Sharlett Iles   Syncope 01/27/2018   Vitamin D deficiency     Past Surgical History:  Procedure Laterality Date   ACHILLES TENDON SURGERY Right 1984   CARPAL TUNNEL RELEASE Bilateral 04/26/2022   CHOLECYSTECTOMY N/A 09/02/2021   Procedure: LAPAROSCOPIC CHOLECYSTECTOMY;  Surgeon: Coralie Keens,  MD;  Location: [facility];  Service: General;  Laterality: N/A;   COLONOSCOPY  2008   COLONOSCOPY WITH PROPOFOL N/A 11/09/2017   Procedure: COLONOSCOPY WITH PROPOFOL;  Surgeon: Manya Silvas, MD;  Location: [facility];  Service: Endoscopy;  Laterality: N/A;   MOHS SURGERY  09-25-2014;  11-27-2013   [place]   09-25-2014 right lower eyelid /  11-27-2013  left lateral cheek   RADIOACTIVE SEED IMPLANT N/A 06/17/2017    Procedure: RADIOACTIVE SEED IMPLANT/BRACHYTHERAPY IMPLANT;  Surgeon: Festus Aloe, MD;  Location[facility];  Service: Urology;  Laterality: N/A;   TONSILLECTOMY  child   TOTAL HIP ARTHROPLASTY Left 09/29/2021   Procedure: TOTAL HIP ARTHROPLASTY ANTERIOR APPROACH;  Surgeon: Paralee Cancel, MD;  Location: [facility];  Service: Orthopedics;  Laterality: Left;   TOTAL KNEE ARTHROPLASTY  12/04/2012   Procedure: TOTAL KNEE ARTHROPLASTY;  Surgeon: Vickey Huger, MD;  Location: [place];  Service: Orthopedics;  Laterality: Left;  left total knee arthroplasty   VARICOSE VEIN SURGERY  2000   left leg    Social History:  reports that he has never smoked. He quit smokeless tobacco use about 5 years ago.  His smokeless tobacco use included chew. He reports current alcohol use of about 14.0 standard drinks of alcohol per week. He reports that he does not use drugs.  Family History:  Family History  Problem Relation Age of Onset   Cancer Mother        Multiple Myeloma   Alcohol abuse Father    Arthritis Father    Prostate cancer Brother    Breast cancer Daughter    Hematuria Neg Hx    Sickle cell anemia Neg Hx    Kidney cancer Neg Hx    Bladder Cancer Neg Hx      Prior to Admission medications   Medication Sig Start Date End Date Taking? Authorizing Provider  acetaminophen (TYLENOL) 650 MG suppository Place 650 mg rectally every 4 (four) hours as needed.    [provider]  amLODipine (NORVASC) 5 MG tablet TAKE 1 TABLET BY MOUTH ONCE DAILY 12/22/21   Leone Haven, MD  Ascorbic Acid (VITAMIN C) 1000 MG tablet Take 1,000 mg by mouth daily.    [provider]  cholecalciferol (VITAMIN D) 1000 units tablet Take 1,000 Units by mouth every morning.     [provider]  ezetimibe (ZETIA) 10 MG tablet Take 1 tablet (10 mg total) by mouth daily. 01/08/22   Minna Merritts, MD  folic acid (FOLVITE) 1 MG tablet TAKE 1 TABLET EVERY DAY 09/15/15   Jackolyn Confer, MD  losartan (COZAAR) 100 MG tablet TAKE 1 TABLET BY MOUTH DAILY 01/19/22   Dutch Quint B, FNP  meloxicam (MOBIC) 15 MG tablet TAKE 1 TABLET BY MOUTH ONCE DAILY WITH FOOD 10/07/16   Leone Haven, MD  meloxicam (MOBIC) 15 MG tablet Take 1 tablet by mouth as needed. Patient not taking: Reported on 01/08/2022    [provider]  methotrexate 50 MG/2ML injection INJECT 1ML WEEKLY Patient taking differently: Inject 25 mg into the muscle every Wednesday. 04/15/16   Jackolyn Confer, MD  metoprolol tartrate (LOPRESSOR) 100 MG tablet Take one tablet (100MG) by mouth 2 hours prior to your CT Patient not taking: Reported on 01/08/2022 11/20/21   Minna Merritts, MD  metroNIDAZOLE (METROCREAM) 0.75 % cream Apply 1 application topically daily.  Patient not taking: Reported on 01/08/2022    [provider]  metroNIDAZOLE (  METROGEL) 0.75 % gel Apply topically 2 (two) times daily. 10/19/21   [provider]  ondansetron (ZOFRAN ODT) 4 MG disintegrating tablet Take 1 tablet (4 mg total) by mouth every 8 (eight) hours as needed for nausea or vomiting. Patient not taking: Reported on 01/08/2022 09/02/21   Coralie Keens, MD  pantoprazole (PROTONIX) 40 MG tablet Take 40 mg by mouth daily.    [provider]  rosuvastatin (CRESTOR) 5 MG tablet Take 1 tablet (5 mg total) by mouth daily. 01/08/22   Minna Merritts, MD  sildenafil (VIAGRA) 100 MG tablet Take 100 mg by mouth daily as needed. 12/10/21   [provider]    Physical Exam: Vitals:   05/05/22 1500 05/05/22 1530 05/05/22 1609 05/05/22 1814  BP: 125/72 128/74 139/88 124/69  Pulse: 73 71 80   Resp: (!) 24 (!) 22 16   Temp:      TempSrc:      SpO2: 97% 99% 100%   Weight:      Height:       General: Not in acute distress.  Dry mucous membrane HEENT:       Eyes: PERRL, EOMI, no scleral icterus.       ENT: No discharge from the ears and nose, no pharynx injection, no tonsillar enlargement.         Neck: No JVD, no bruit, no mass felt. Heme: No neck lymph node enlargement. Cardiac: S1/S2, RRR, No murmurs, No gallops or rubs. Respiratory: No rales, wheezing, rhonchi or rubs. GI: Soft, nondistended, nontender, no rebound pain, no organomegaly, BS present. GU: No hematuria Ext: No pitting leg edema bilaterally. 1+DP/PT pulse bilaterally. Musculoskeletal: No joint deformities, No joint redness or warmth, no limitation of ROM in spin. Skin: No rashes.  Neuro: Alert, oriented X3, cranial nerves II-XII grossly intact, moves all extremities normally.  Psych: Patient is not psychotic, no suicidal or hemocidal ideation.  Labs on Admission: I have personally reviewed following labs and imaging studies  CBC: No results for input(s): "WBC", "NEUTROABS", "HGB", "HCT", "MCV", "PLT" in the last 168 hours. Basic Metabolic Panel: Recent Labs  Lab 05/05/22 1030  NA 134*  K 4.3  CL 104  CO2 21*  GLUCOSE 108*  BUN 20  CREATININE 1.47*  CALCIUM 8.1*   GFR: Estimated Creatinine Clearance: 45.5 mL/min (A) (by C-G formula based on SCr of 1.47 mg/dL (H)). Liver Function Tests: Recent Labs  Lab 05/05/22 1030  AST 226*  ALT 206*  ALKPHOS 124  BILITOT 0.8  PROT 6.1*  ALBUMIN 3.3*   No results for input(s): "LIPASE", "AMYLASE" in the last 168 hours. No results for input(s): "AMMONIA" in the last 168 hours. Coagulation Profile: No results for input(s): "INR", "PROTIME" in the last 168 hours. Cardiac Enzymes: No results for input(s): "CKTOTAL", "CKMB", "CKMBINDEX", "TROPONINI" in the last 168 hours. BNP (last 3 results) No results for input(s): "PROBNP" in the last 8760 hours. HbA1C: No results for input(s): "HGBA1C" in the last 72 hours. CBG: No results for input(s): "GLUCAP" in the last 168 hours. Lipid Profile: No results for input(s): "CHOL", "HDL", "LDLCALC", "TRIG", "CHOLHDL", "LDLDIRECT" in the last 72 hours. Thyroid Function Tests: No results for input(s): "TSH", "T4TOTAL",  "FREET4", "T3FREE", "THYROIDAB" in the last 72 hours. Anemia Panel: No results for input(s): "VITAMINB12", "FOLATE", "FERRITIN", "TIBC", "IRON", "RETICCTPCT" in the last 72 hours. Urine analysis:    Component Value Date/Time   COLORURINE YELLOW 01/28/2018 0351   APPEARANCEUR CLEAR 01/28/2018 0351   APPEARANCEUR Clear  06/01/2017 1425   LABSPEC 1.018 01/28/2018 0351   PHURINE 5.0 01/28/2018 0351   GLUCOSEU NEGATIVE 01/28/2018 0351   HGBUR NEGATIVE 01/28/2018 0351   BILIRUBINUR negative 09/07/2021 1636   BILIRUBINUR Negative 06/01/2017 1425   KETONESUR NEGATIVE 01/28/2018 0351   PROTEINUR Negative 09/07/2021 1636   PROTEINUR NEGATIVE 01/28/2018 0351   UROBILINOGEN 1.0 09/07/2021 1636   UROBILINOGEN 0.2 11/29/2012 0845   NITRITE negative 09/07/2021 1636   NITRITE NEGATIVE 01/28/2018 0351   LEUKOCYTESUR Negative 09/07/2021 1636   LEUKOCYTESUR Negative 06/01/2017 1425   Sepsis Labs: '@LABRCNTIP' (procalcitonin:4,lacticidven:4) )No results found for this or any previous visit (from the past 240 hour(s)).   Radiological Exams on Admission: CT Angio Chest PE W and/or Wo Contrast  Result Date: 05/05/2022 CLINICAL DATA:  Near syncopal episode in the shower, nausea, vomiting, positive D-dimer EXAM: CT ANGIOGRAPHY CHEST WITH CONTRAST TECHNIQUE: Multidetector CT imaging of the chest was performed using the standard protocol during bolus administration of intravenous contrast. Multiplanar CT image reconstructions and MIPs were obtained to evaluate the vascular anatomy. RADIATION DOSE REDUCTION: This exam was performed according to the departmental dose-optimization program which includes automated exposure control, adjustment of the mA and/or kV according to patient size and/or use of iterative reconstruction technique. CONTRAST:  162m OMNIPAQUE IOHEXOL 350 MG/ML SOLN COMPARISON:  05/05/2022 FINDINGS: Cardiovascular: Pulmonary arteries appear patent and normal in caliber. Negative for significant  filling defect pulmonary embolus by CTA. Intact thoracic aorta. Atherosclerotic changes noted. No aneurysm or dissection. Patent 2 vessel arch anatomy, normal variant. No mediastinal hemorrhage or hematoma. Normal heart size. Native coronary atherosclerosis present. No pericardial effusion. Central venous structures appear patent. No veno-occlusive finding. No anomalous pulmonary venous return. Mediastinum/Nodes: No enlarged mediastinal, hilar, or axillary lymph nodes. Thyroid gland, trachea, and esophagus demonstrate no significant findings. Lungs/Pleura: Lingula and bibasilar atelectasis versus scarring. Left lower lobe subpleural calcified granuloma measures 7 mm, image 48/6. No acute airspace process, collapse or consolidation. No interstitial process or edema. No pleural abnormality, effusion, or pneumothorax. Trachea and central airways are patent. Upper Abdomen: No acute finding. Musculoskeletal: Degenerative changes throughout the thoracic spine. No acute osseous finding. Intact sternum. No compression fracture. Review of the MIP images confirms the above findings. IMPRESSION: Negative for significant acute pulmonary embolus by CTA. No other acute intrathoracic finding by CT. Aortic Atherosclerosis (ICD10-I70.0). Electronically Signed   By: MJerilynn Mages  Shick M.D.   On: 05/05/2022 14:41   CT ABDOMEN PELVIS W CONTRAST  Result Date: 05/05/2022 CLINICAL DATA:  Near syncopal episode in the shower, not feeling well, nausea, vomiting weight loss EXAM: CT ABDOMEN AND PELVIS WITH CONTRAST TECHNIQUE: Multidetector CT imaging of the abdomen and pelvis was performed using the standard protocol following bolus administration of intravenous contrast. RADIATION DOSE REDUCTION: This exam was performed according to the departmental dose-optimization program which includes automated exposure control, adjustment of the mA and/or kV according to patient size and/or use of iterative reconstruction technique. CONTRAST:  1077m OMNIPAQUE IOHEXOL 350 MG/ML SOLN COMPARISON:  None Available. FINDINGS: Lower chest: Inferior lingula and left lower lobe atelectasis versus scarring. Right lung base clear. Normal heart size. Native coronary atherosclerosis present. No pericardial effusion or pleural effusion. Hepatobiliary: Mild diffuse hypoattenuation of the liver compatible with hepatic steatosis. No large focal hepatic abnormality. No biliary dilatation or obstruction. Remote cholecystectomy. Common bile duct nondilated. Pancreas: Unremarkable. No pancreatic ductal dilatation or surrounding inflammatory changes. Spleen: Normal in size without focal abnormality. Accessory splenule noted anteriorly. Adrenals/Urinary Tract: Normal adrenal glands. Left parapelvic and  bilateral cortical hypodense cysts noted. No further imaging recommended. No renal obstruction pattern or hydronephrosis. No hydroureter or ureteral calculus. Bladder collapsed and partial obscured by artifact from the left hip replacement. Stomach/Bowel: Negative for bowel obstruction, significant dilatation, ileus, or free air. Scattered colonic diverticulosis without acute inflammatory process. Normal appendix along the left hemipelvis. No significant free fluid, fluid collection, hemorrhage hematoma, abscess or ascites. Vascular/Lymphatic: Aorta atherosclerotic. No aneurysm or retroperitoneal hemorrhage. Negative for dissection. Mesenteric and renal vasculature appear patent. No veno-occlusive process. Negative for bulky adenopathy. Reproductive: Radiation prostate seeds noted. Seminal vesicles are symmetric. No other significant finding by CT. Other: No abdominal wall hernia or abnormality. No abdominopelvic ascites. Musculoskeletal: Degenerative changes noted throughout the spine and lower lumbar facet joints. No acute osseous finding. Remote left hip arthroplasty hardware evident. IMPRESSION: No acute intra-abdominal or pelvic finding by CT. Aortic and native coronary  atherosclerosis Hepatic steatosis.  Remote cholecystectomy. Colonic diverticulosis without acute inflammatory process Electronically Signed   By: Jerilynn Mages.  Shick M.D.   On: 05/05/2022 14:35   DG Chest Portable 1 View  Result Date: 05/05/2022 CLINICAL DATA:  Syncope this morning.  Nausea. EXAM: PORTABLE CHEST 1 VIEW COMPARISON:  01/27/2018 FINDINGS: Low lung volumes are present, causing crowding of the pulmonary vasculature. Moderately apical lordotic projection. Chronic linear densities at the left lung base compatible with scarring. Lungs appear otherwise clear. Atherosclerotic calcification of the aortic arch. Thoracic spondylosis. IMPRESSION: 1. Stable left basilar scarring. 2.  Aortic Atherosclerosis (ICD10-I70.0). Electronically Signed   By: Van Clines M.D.   On: 05/05/2022 09:03      Assessment/Plan Active Problems:   Syncope   Abnormal LFTs   AKI (acute kidney injury) ([place])   Alcohol use   Barrett esophagus   HLD (hyperlipidemia)   Hypertension   Positive D-dimer   Rheumatoid arthritis (HCC)   Assessment and Plan: Syncope Syncope: Etiology is not clear.  Possibly due to dehydration and orthostatic status versus vasovagal syncope.  Patient does not have focal neurologic deficit on physical examination, low suspicions of stroke.  No seizure activity.  CTA negative for PE  - Place on telemetry for obs - Orthostatic vital signs  - Neuro checks  - IVF: 1 L normal saline, then NS 75 cc/h - PT/OT eval and treat   Abnormal LFTs Likely due to alcohol abuse -Avoid using Tylenol -Check hepatitis panel  AKI (acute kidney injury) ([place]) Likely due to dehydration -IV fluid as above -Avoid using renal toxic medications -Hold off Mobic and Cozaar  Alcohol use - Due to counseling about importance of quitting alcohol use -CIWA protocol  Barrett esophagus - On Protonix  HLD (hyperlipidemia) - Zetia, Crestor  Hypertension - IV hydralazine as needed -Continue home  amlodipine -Hold Cozaar due to AKI  Positive D-dimer -f/u lower extremity venous Doppler to rule out DVT  Rheumatoid arthritis ([place]) Patient is not taking methotrexate currently -As needed tramadol for pain (patient cannot use Tylenol due to abnormal liver function, cannot use NSAIDs due to AKI)          DVT ppx: SQ Lovenox  Code Status: Full code  Family Communication:  Yes, patient's  wife  at bed side.    Disposition Plan:  Anticipate discharge back to previous environment  Consults called:  none  Admission status and Level of care: Telemetry Medical:   for obs        Severity of Illness:  The appropriate patient status for this patient is OBSERVATION. Observation status is judged  to be reasonable and necessary in order to provide the required intensity of service to ensure the patient's safety. The patient's presenting symptoms, physical exam findings, and initial radiographic and laboratory data in the context of their medical condition is felt to place them at decreased risk for further clinical deterioration. Furthermore, it is anticipated that the patient will be medically stable for discharge from the hospital within 2 midnights of admission.        Date of Service 05/05/2022    [facility]   If 7PM-7AM, please contact night-coverage [email or web address] 05/05/2022, 6:24 PM

## 2022-05-05 NOTE — Assessment & Plan Note (Signed)
On Protonix 

## 2022-05-05 NOTE — Assessment & Plan Note (Signed)
-   Zetia, Crestor

## 2022-05-05 NOTE — ED Triage Notes (Addendum)
Pt comes into the ED via EMS from home with c/o near syncope since while in the shower this morning, states he has not been feeling well over the past 2 days, pt states he has been having nausea for the past couple of days, denies vomiting or diarrhea  135/65 Standing 77/51 HR107 96%RA [id number] #[id number] 479m NS infused

## 2022-05-05 NOTE — Assessment & Plan Note (Signed)
-   Due to counseling about importance of quitting alcohol use -CIWA protocol

## 2022-05-05 NOTE — ED Provider Notes (Signed)
[facility] Provider Note    Event Date/Time   First MD Initiated Contact with Patient 05/05/22 [phone]     (approximate)   History   Near Syncope   HPI  Julian Pineda is a 76 y.o. male who had not eaten since yesterday.  He got up took a shower and got very weak lightheaded and sweaty and passed out.  He passed out briefly.  His wife thinks he did anyway he did not think he passed out himself.  He feels okay now.  Just weak.  He is not eating very much at all and has not eaten much for some time because he gets nauseated very easily.  He recently had an upper endoscopy which showed Barrett's esophagus.  He has not had a CT.      Physical Exam   Triage Vital Signs: ED Triage Vitals  Enc Vitals Group     BP 05/05/22 0820 (!) 116/58     Pulse Rate 05/05/22 0820 88     Resp 05/05/22 0820 16     Temp 05/05/22 0820 98.7 F (37.1 C)     Temp Source 05/05/22 0820 Oral     SpO2 05/05/22 0820 96 %     Weight 05/05/22 0809 188 lb (85.3 kg)     Height 05/05/22 0809 '5\' 10"'$  (1.778 m)     Head Circumference --      Peak Flow --      Pain Score 05/05/22 0809 0     Pain Loc --      Pain Edu? --      Excl. in [place]? --     Most recent vital signs: Vitals:   05/05/22 1325 05/05/22 1500  BP: 127/74 125/72  Pulse: 67 73  Resp: 16 (!) 24  Temp: 98.2 F (36.8 C)   SpO2: 96% 97%     General: Awake, no distress.  CV:  Good peripheral perfusion.  Heart regular rate and rhythm no audible murmurs Resp:  Normal effort.  Lungs are clear Abd:  No distention.  Soft and nontender    ED Results / Procedures / Treatments   Labs (all labs ordered are listed, but only abnormal results are displayed) Labs Reviewed  COMPREHENSIVE METABOLIC PANEL - Abnormal; Notable for the following components:      Result Value   Sodium 134 (*)    CO2 21 (*)    Glucose, Bld 108 (*)    Creatinine, Ser 1.47 (*)    Calcium 8.1 (*)    Total Protein 6.1 (*)    Albumin 3.3 (*)     AST 226 (*)    ALT 206 (*)    GFR, Estimated 50 (*)    All other components within normal limits  D-DIMER, QUANTITATIVE - Abnormal; Notable for the following components:   D-Dimer, Quant 1.85 (*)    All other components within normal limits  URINALYSIS, ROUTINE W REFLEX MICROSCOPIC  CBC WITH DIFFERENTIAL/PLATELET  URINE DRUG SCREEN, QUALITATIVE ([facility] ONLY)  HEPATITIS PANEL, ACUTE  CBG MONITORING, ED  TROPONIN I (HIGH SENSITIVITY)  TROPONIN I (HIGH SENSITIVITY)     EKG  EKG read interpreted by me shows normal sinus rhythm rate of 87 normal axis there is a S1Q3T3 injury the patient's syncope I decided to get a CT angio.   RADIOLOGY Chest x-ray read by radiology reviewed and interpreted by me shows some scarring in the bases it. CT of the chest with contrast to rule  out PE since the patient has a S1Q3T3 rhythm into her some atherosclerosis but no other acute pathology.  CT of the belly with contrast read and interpreted by radiology and again by me shows no acute problems either there is more atherosclerosis however.  PROCEDURES:  Critical Care performed:   Procedures   MEDICATIONS ORDERED IN ED: Medications  ondansetron (ZOFRAN) injection 4 mg (has no administration in time range)  hydrALAZINE (APRESOLINE) injection 5 mg (has no administration in time range)  LORazepam (ATIVAN) tablet 1-4 mg (has no administration in time range)    Or  LORazepam (ATIVAN) injection 1-4 mg (has no administration in time range)  thiamine tablet 100 mg (has no administration in time range)    Or  thiamine (B-1) injection 100 mg (has no administration in time range)  folic acid (FOLVITE) tablet 1 mg (has no administration in time range)  multivitamin with minerals tablet 1 tablet (has no administration in time range)  LORazepam (ATIVAN) injection 0-4 mg (has no administration in time range)    Followed by  LORazepam (ATIVAN) injection 0-4 mg (has no administration in time range)  enoxaparin  (LOVENOX) injection 40 mg (has no administration in time range)  amLODipine (NORVASC) tablet 5 mg (has no administration in time range)  ezetimibe (ZETIA) tablet 10 mg (has no administration in time range)  rosuvastatin (CRESTOR) tablet 5 mg (has no administration in time range)  pantoprazole (PROTONIX) EC tablet 40 mg (has no administration in time range)  ascorbic acid (VITAMIN C) tablet 1,000 mg (has no administration in time range)  cholecalciferol (VITAMIN D3) tablet 1,000 Units (has no administration in time range)  sodium chloride 0.9 % bolus 1,000 mL (0 mLs Intravenous Stopped 05/05/22 1010)  iohexol (OMNIPAQUE) 350 MG/ML injection 100 mL (100 mLs Intravenous Contrast Given 05/05/22 1413)     IMPRESSION / MDM / ASSESSMENT AND PLAN / ED COURSE  I reviewed the triage vital signs and the nursing notes. ----------------------------------------- 12:09 PM on 05/05/2022 ----------------------------------------- I am still waiting on the labs like the D-dimer etc. to come back.  We have called the lab and we are still waiting. ----------------------------------------- 4:02 PM on 05/05/2022 ----------------------------------------- Patient syncopized.  This is not his first time.  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  Additionally he has had some work-up for his weight loss that he reports in his not taking well p.o.  CT does not show any sign of an tumor outside of his intestines.  However in view of his age and atherosclerosis on syncope and sweating today I think it would be the safest thing to do to admit him to the hospital and do further work-up on him.   Patient's presentation is most consistent with acute presentation with potential threat to life or bodily function. The patient is on the cardiac monitor to evaluate for evidence of arrhythmia and/or significant heart rate changes.  None were seen.      FINAL CLINICAL IMPRESSION(S) / ED DIAGNOSES   Final diagnoses:   Syncope and collapse     Rx / DC Orders   ED Discharge Orders     None        Note:  This document was prepared using Dragon voice recognition software and may include unintentional dictation errors.   Nena Polio, MD 05/05/22 [phone]

## 2022-05-05 NOTE — Assessment & Plan Note (Signed)
Syncope: Etiology is not clear.  Possibly due to dehydration and orthostatic status versus vasovagal syncope.  Patient does not have focal neurologic deficit on physical examination, low suspicions of stroke.  No seizure activity.  CTA negative for PE  - Place on telemetry for obs - Orthostatic vital signs  - Neuro checks  - IVF: 1 L normal saline, then NS 75 cc/h - PT/OT eval and treat

## 2022-05-06 ENCOUNTER — Encounter: Payer: Self-pay | Admitting: Internal Medicine

## 2022-05-06 ENCOUNTER — Other Ambulatory Visit: Payer: Self-pay

## 2022-05-06 ENCOUNTER — Observation Stay: Payer: PPO

## 2022-05-06 DIAGNOSIS — N179 Acute kidney failure, unspecified: Secondary | ICD-10-CM | POA: Diagnosis not present

## 2022-05-06 DIAGNOSIS — R55 Syncope and collapse: Secondary | ICD-10-CM | POA: Diagnosis not present

## 2022-05-06 DIAGNOSIS — R7989 Other specified abnormal findings of blood chemistry: Secondary | ICD-10-CM | POA: Diagnosis not present

## 2022-05-06 DIAGNOSIS — Z789 Other specified health status: Secondary | ICD-10-CM | POA: Diagnosis not present

## 2022-05-06 LAB — BASIC METABOLIC PANEL
Anion gap: 5 (ref 5–15)
BUN: 18 mg/dL (ref 8–23)
CO2: 23 mmol/L (ref 22–32)
Calcium: 8.5 mg/dL — ABNORMAL LOW (ref 8.9–10.3)
Chloride: 104 mmol/L (ref 98–111)
Creatinine, Ser: 1.06 mg/dL (ref 0.61–1.24)
GFR, Estimated: >60 mL/min (ref >60)
Glucose, Bld: 100 mg/dL — ABNORMAL HIGH (ref 70–99)
Potassium: 3.8 mmol/L (ref 3.5–5.1)
Sodium: 132 mmol/L — ABNORMAL LOW (ref 135–145)

## 2022-05-06 LAB — HEPATIC FUNCTION PANEL
ALT: 151 U/L — ABNORMAL HIGH (ref 0–44)
AST: 97 U/L — ABNORMAL HIGH (ref 15–41)
Albumin: 3 g/dL — ABNORMAL LOW (ref 3.5–5.0)
Alkaline Phosphatase: 109 U/L (ref 38–126)
Bilirubin, Direct: 0.1 mg/dL (ref 0.0–0.2)
Indirect Bilirubin: 0.6 mg/dL (ref 0.3–0.9)
Total Bilirubin: 0.7 mg/dL (ref 0.3–1.2)
Total Protein: 5.9 g/dL — ABNORMAL LOW (ref 6.5–8.1)

## 2022-05-06 LAB — HEPATITIS PANEL, ACUTE
HCV Ab: NONREACTIVE
Hep A IgM: NONREACTIVE
Hep B C IgM: NONREACTIVE
Hepatitis B Surface Ag: NONREACTIVE

## 2022-05-06 MED ORDER — DIAZEPAM 5 MG PO TABS: ORAL_TABLET | ORAL | 0 refills | Status: AC

## 2022-05-06 NOTE — Progress Notes (Signed)
OT Cancellation Note  Patient Details Name: Julian Pineda MRN: [id number] DOB: June 03, 1946   Cancelled Treatment:    Reason Eval/Treat Not Completed: OT screened, no needs identified, will sign off. Pt is currently at baseline level of function for ambulation and self care tasks. OT to complete order.   Darleen Crocker, MS, OTR/L , CBIS ascom [phone]  05/06/22, 11:15 AM

## 2022-05-06 NOTE — Plan of Care (Signed)

## 2022-05-06 NOTE — Progress Notes (Signed)
Pt discharged home with family. PIV and tele removed. Discharge instructions reviewed with patient and wife. Pt refused wheelchair to front. Pt walked out to main entrance.

## 2022-05-06 NOTE — Discharge Summary (Signed)
Physician Discharge Summary  Julian Pineda:[id number] DOB: [birth year] DOA: 05/05/2022  PCP: Leone Haven, MD  Admit date: 05/05/2022 Discharge date: 05/06/2022  Discharge disposition: Home   Recommendations for Outpatient Follow-Up:   Outpatient follow-up with PCP in 1 week. Repeat liver enzymes in 1 week to ensure continued improvement in liver enzymes.   Discharge Diagnosis:   Active Problems:   Syncope   Abnormal LFTs   AKI (acute kidney injury) ([place])   Alcohol use   Barrett esophagus   HLD (hyperlipidemia)   Hypertension   Positive D-dimer   Rheumatoid arthritis ([place])    Discharge Condition: Stable.  Diet recommendation:  Diet Order             Diet - low sodium heart healthy           Diet Heart Room service appropriate? Yes; Fluid consistency: Thin  Diet effective now                     Code Status: Full Code     Hospital Course:   Julian Pineda is a 76 y.o. male with medical history significant of hypertension, hyperlipidemia, GERD, rheumatoid arthritis, prostate cancer (s/p of radiation seeds placement), skin cancer, alcohol use disorder, who presented to the hospital with syncope.  According to his wife, he sat down after a bath and a shave.  While sitting down, patient briefly lost consciousness probably for about 1 to 2 seconds.  There were no other associated symptoms.  He was found to have hypotension and AKI.  He was treated with IV fluids and AKI and hypotension have resolved.  D-dimer was positive but there was no evidence of DVT or acute pulmonary embolism.  His condition has improved.  He was able to ambulate in the hallway without any symptoms.  Orthostatic vital signs were normal on the day of discharge.  He has been advised to avoid alcohol because of fatty liver and elevated liver enzymes which can progress to liver cirrhosis.  He said attempt to quit alcohol in the past was unsuccessful because he developed withdrawal  symptoms.  He requested "something" to help him through the next few days as he tries to quit alcohol.  He was discharged on diazepam.  His condition has improved and he is deemed stable for discharge to home today.  Discharge plan was discussed with the patient and his wife at the bedside.    Medical Consultants:   None   Discharge Exam:    Vitals:   05/06/22 0041 05/06/22 0400 05/06/22 0444 05/06/22 0856  BP: (!) 103/58 124/62 110/66 123/75  Pulse: 81 75 74 74  Resp: '18 18 16 15  '$ Temp:  98.6 F (37 C) 99.7 F (37.6 C) 98.6 F (37 C)  TempSrc:  Oral    SpO2: 96% 98% 99% 98%  Weight:      Height:         GEN: NAD SKIN: Warm and dry EYES: No pallor or icterus ENT: MMM CV: RRR PULM: CTA B ABD: soft, ND, NT, +BS CNS: AAO x 3, non focal EXT: No edema or tenderness   The results of significant diagnostics from this hospitalization (including imaging, microbiology, ancillary and laboratory) are listed below for reference.     Procedures and Diagnostic Studies:   US Venous Img Lower Bilateral (DVT)  Result Date: 05/06/2022 CLINICAL DATA:  76 year old male with positive D-dimer EXAM: BILATERAL LOWER EXTREMITY VENOUS DOPPLER ULTRASOUND TECHNIQUE:  Gray-scale sonography with graded compression, as well as color Doppler and duplex ultrasound were performed to evaluate the lower extremity deep venous systems from the level of the common femoral vein and including the common femoral, femoral, profunda femoral, popliteal and calf veins including the posterior tibial, peroneal and gastrocnemius veins when visible. The superficial great saphenous vein was also interrogated. Spectral Doppler was utilized to evaluate flow at rest and with distal augmentation maneuvers in the common femoral, femoral and popliteal veins. COMPARISON:  None Available. FINDINGS: RIGHT LOWER EXTREMITY Common Femoral Vein: No evidence of thrombus. Normal compressibility, respiratory phasicity and response to  augmentation. Saphenofemoral Junction: No evidence of thrombus. Normal compressibility and flow on color Doppler imaging. Profunda Femoral Vein: No evidence of thrombus. Normal compressibility and flow on color Doppler imaging. Femoral Vein: No evidence of thrombus. Normal compressibility, respiratory phasicity and response to augmentation. Popliteal Vein: No evidence of thrombus. Normal compressibility, respiratory phasicity and response to augmentation. Calf Veins: No evidence of thrombus. Normal compressibility and flow on color Doppler imaging. Superficial Great Saphenous Vein: No evidence of thrombus. Normal compressibility and flow on color Doppler imaging. Other Findings:  None. LEFT LOWER EXTREMITY Common Femoral Vein: No evidence of thrombus. Normal compressibility, respiratory phasicity and response to augmentation. Saphenofemoral Junction: No evidence of thrombus. Normal compressibility and flow on color Doppler imaging. Profunda Femoral Vein: No evidence of thrombus. Normal compressibility and flow on color Doppler imaging. Femoral Vein: No evidence of thrombus. Normal compressibility, respiratory phasicity and response to augmentation. Popliteal Vein: No evidence of thrombus. Normal compressibility, respiratory phasicity and response to augmentation. Calf Veins: No evidence of thrombus. Normal compressibility and flow on color Doppler imaging. Superficial Great Saphenous Vein: No evidence of thrombus. Normal compressibility and flow on color Doppler imaging. Other Findings:  None. IMPRESSION: Directed duplex of the bilateral lower extremities negative for DVT Signed, Dulcy Fanny. Nadene Rubins, RPVI [facility] Radiology Electronically Signed   By: Corrie Mckusick D.O.   On: 05/06/2022 08:48   CT Angio Chest PE W and/or Wo Contrast  Result Date: 05/05/2022 CLINICAL DATA:  Near syncopal episode in the shower, nausea, vomiting, positive D-dimer EXAM: CT  ANGIOGRAPHY CHEST WITH CONTRAST TECHNIQUE: Multidetector CT imaging of the chest was performed using the standard protocol during bolus administration of intravenous contrast. Multiplanar CT image reconstructions and MIPs were obtained to evaluate the vascular anatomy. RADIATION DOSE REDUCTION: This exam was performed according to the departmental dose-optimization program which includes automated exposure control, adjustment of the mA and/or kV according to patient size and/or use of iterative reconstruction technique. CONTRAST:  110m OMNIPAQUE IOHEXOL 350 MG/ML SOLN COMPARISON:  05/05/2022 FINDINGS: Cardiovascular: Pulmonary arteries appear patent and normal in caliber. Negative for significant filling defect pulmonary embolus by CTA. Intact thoracic aorta. Atherosclerotic changes noted. No aneurysm or dissection. Patent 2 vessel arch anatomy, normal variant. No mediastinal hemorrhage or hematoma. Normal heart size. Native coronary atherosclerosis present. No pericardial effusion. Central venous structures appear patent. No veno-occlusive finding. No anomalous pulmonary venous return. Mediastinum/Nodes: No enlarged mediastinal, hilar, or axillary lymph nodes. Thyroid gland, trachea, and esophagus demonstrate no significant findings. Lungs/Pleura: Lingula and bibasilar atelectasis versus scarring. Left lower lobe subpleural calcified granuloma measures 7 mm, image 48/6. No acute airspace process, collapse or consolidation. No interstitial process or edema. No pleural abnormality, effusion, or pneumothorax. Trachea and central airways are patent. Upper Abdomen: No acute finding. Musculoskeletal: Degenerative changes throughout the thoracic spine. No acute osseous finding. Intact sternum. No  compression fracture. Review of the MIP images confirms the above findings. IMPRESSION: Negative for significant acute pulmonary embolus by CTA. No other acute intrathoracic finding by CT. Aortic Atherosclerosis (ICD10-I70.0).  Electronically Signed   By: Jerilynn Mages.  Shick M.D.   On: 05/05/2022 14:41   CT ABDOMEN PELVIS W CONTRAST  Result Date: 05/05/2022 CLINICAL DATA:  Near syncopal episode in the shower, not feeling well, nausea, vomiting weight loss EXAM: CT ABDOMEN AND PELVIS WITH CONTRAST TECHNIQUE: Multidetector CT imaging of the abdomen and pelvis was performed using the standard protocol following bolus administration of intravenous contrast. RADIATION DOSE REDUCTION: This exam was performed according to the departmental dose-optimization program which includes automated exposure control, adjustment of the mA and/or kV according to patient size and/or use of iterative reconstruction technique. CONTRAST:  18m OMNIPAQUE IOHEXOL 350 MG/ML SOLN COMPARISON:  None Available. FINDINGS: Lower chest: Inferior lingula and left lower lobe atelectasis versus scarring. Right lung base clear. Normal heart size. Native coronary atherosclerosis present. No pericardial effusion or pleural effusion. Hepatobiliary: Mild diffuse hypoattenuation of the liver compatible with hepatic steatosis. No large focal hepatic abnormality. No biliary dilatation or obstruction. Remote cholecystectomy. Common bile duct nondilated. Pancreas: Unremarkable. No pancreatic ductal dilatation or surrounding inflammatory changes. Spleen: Normal in size without focal abnormality. Accessory splenule noted anteriorly. Adrenals/Urinary Tract: Normal adrenal glands. Left parapelvic and bilateral cortical hypodense cysts noted. No further imaging recommended. No renal obstruction pattern or hydronephrosis. No hydroureter or ureteral calculus. Bladder collapsed and partial obscured by artifact from the left hip replacement. Stomach/Bowel: Negative for bowel obstruction, significant dilatation, ileus, or free air. Scattered colonic diverticulosis without acute inflammatory process. Normal appendix along the left hemipelvis. No significant free fluid, fluid collection, hemorrhage  hematoma, abscess or ascites. Vascular/Lymphatic: Aorta atherosclerotic. No aneurysm or retroperitoneal hemorrhage. Negative for dissection. Mesenteric and renal vasculature appear patent. No veno-occlusive process. Negative for bulky adenopathy. Reproductive: Radiation prostate seeds noted. Seminal vesicles are symmetric. No other significant finding by CT. Other: No abdominal wall hernia or abnormality. No abdominopelvic ascites. Musculoskeletal: Degenerative changes noted throughout the spine and lower lumbar facet joints. No acute osseous finding. Remote left hip arthroplasty hardware evident. IMPRESSION: No acute intra-abdominal or pelvic finding by CT. Aortic and native coronary atherosclerosis Hepatic steatosis.  Remote cholecystectomy. Colonic diverticulosis without acute inflammatory process Electronically Signed   By: MJerilynn Mages  Shick M.D.   On: 05/05/2022 14:35   DG Chest Portable 1 View  Result Date: 05/05/2022 CLINICAL DATA:  Syncope this morning.  Nausea. EXAM: PORTABLE CHEST 1 VIEW COMPARISON:  01/27/2018 FINDINGS: Low lung volumes are present, causing crowding of the pulmonary vasculature. Moderately apical lordotic projection. Chronic linear densities at the left lung base compatible with scarring. Lungs appear otherwise clear. Atherosclerotic calcification of the aortic arch. Thoracic spondylosis. IMPRESSION: 1. Stable left basilar scarring. 2.  Aortic Atherosclerosis (ICD10-I70.0). Electronically Signed   By: WVan ClinesM.D.   On: 05/05/2022 09:03     Labs:   Basic Metabolic Panel: Recent Labs  Lab 05/05/22 1030 05/06/22 0543  NA 134* 132*  K 4.3 3.8  CL 104 104  CO2 21* 23  GLUCOSE 108* 100*  BUN 20 18  CREATININE 1.47* 1.06  CALCIUM 8.1* 8.5*   GFR Estimated Creatinine Clearance: 63.1 mL/min (by C-G formula based on SCr of 1.06 mg/dL). Liver Function Tests: Recent Labs  Lab 05/05/22 1030 05/06/22 0543  AST 226* 97*  ALT 206* 151*  ALKPHOS 124 109  BILITOT 0.8  0.7  PROT 6.1* 5.9*  ALBUMIN 3.3* 3.0*   No results for input(s): "LIPASE", "AMYLASE" in the last 168 hours. No results for input(s): "AMMONIA" in the last 168 hours. Coagulation profile No results for input(s): "INR", "PROTIME" in the last 168 hours.  CBC: No results for input(s): "WBC", "NEUTROABS", "HGB", "HCT", "MCV", "PLT" in the last 168 hours. Cardiac Enzymes: No results for input(s): "CKTOTAL", "CKMB", "CKMBINDEX", "TROPONINI" in the last 168 hours. BNP: Invalid input(s): "POCBNP" CBG: No results for input(s): "GLUCAP" in the last 168 hours. D-Dimer Recent Labs    05/05/22 1250  DDIMER 1.85*   Hgb A1c No results for input(s): "HGBA1C" in the last 72 hours. Lipid Profile No results for input(s): "CHOL", "HDL", "LDLCALC", "TRIG", "CHOLHDL", "LDLDIRECT" in the last 72 hours. Thyroid function studies No results for input(s): "TSH", "T4TOTAL", "T3FREE", "THYROIDAB" in the last 72 hours.  Invalid input(s): "FREET3" Anemia work up No results for input(s): "VITAMINB12", "FOLATE", "FERRITIN", "TIBC", "IRON", "RETICCTPCT" in the last 72 hours. Microbiology No results found for this or any previous visit (from the past 240 hour(s)).   Discharge Instructions:   Discharge Instructions     Diet - low sodium heart healthy   Complete by: As directed    Discharge instructions   Complete by: As directed    Avoid alcohol. Follow up with your PCP oro cardiologist to check your liver enzymes in 1 week Hold Lipitor until further instructions from your PCP or cardiologist.   Increase activity slowly   Complete by: As directed       Allergies as of 05/06/2022       Reactions   Penicillins Other (See Comments)   Unknown childhood reaction Tolerated 2 grams of ancef 09/29/2021 without problem        Medication List     STOP taking these medications    methotrexate 50 MG/2ML injection   metoprolol tartrate 100 MG tablet Commonly known as: LOPRESSOR   metroNIDAZOLE  0.75 % gel Commonly known as: METROGEL   rosuvastatin 5 MG tablet Commonly known as: CRESTOR   sulfamethoxazole-trimethoprim 800-160 MG tablet Commonly known as: BACTRIM DS       TAKE these medications    amLODipine 5 MG tablet Commonly known as: NORVASC TAKE 1 TABLET BY MOUTH ONCE DAILY   cholecalciferol 1000 units tablet Commonly known as: VITAMIN D Take 1,000 Units by mouth every morning.   diazepam 5 MG tablet Commonly known as: Valium Take 2 tablets (10 mg total) by mouth 3 (three) times daily for 1 day, THEN 2 tablets (10 mg total) 2 (two) times daily for 2 days, THEN 1 tablet (5 mg total) 2 (two) times daily for 2 days. Start taking on: May 06, 2022   ezetimibe 10 MG tablet Commonly known as: ZETIA Take 1 tablet (10 mg total) by mouth daily.   folic acid 1 MG tablet Commonly known as: FOLVITE TAKE 1 TABLET EVERY DAY   losartan 100 MG tablet Commonly known as: COZAAR TAKE 1 TABLET BY MOUTH DAILY   meloxicam 15 MG tablet Commonly known as: MOBIC TAKE 1 TABLET BY MOUTH ONCE DAILY WITH FOOD What changed: Another medication with the same name was removed. Continue taking this medication, and follow the directions you see here.   ondansetron 4 MG disintegrating tablet Commonly known as: Zofran ODT Take 1 tablet (4 mg total) by mouth every 8 (eight) hours as needed for nausea or vomiting.   pantoprazole 40 MG tablet Commonly known as: PROTONIX Take 40 mg by mouth daily.  sildenafil 100 MG tablet Commonly known as: VIAGRA Take 100 mg by mouth daily as needed.   vitamin C 1000 MG tablet Take 1,000 mg by mouth daily.           If you experience worsening of your admission symptoms, develop shortness of breath, life threatening emergency, suicidal or homicidal thoughts you must seek medical attention immediately by calling 911 or calling your MD immediately  if symptoms less severe.   You must read complete instructions/literature along with all the  possible adverse reactions/side effects for all the medicines you take and that have been prescribed to you. Take any new medicines after you have completely understood and accept all the possible adverse reactions/side effects.    Please note   You were cared for by a hospitalist during your hospital stay. If you have any questions about your discharge medications or the care you received while you were in the hospital after you are discharged, you can call the unit and asked to speak with the hospitalist on call if the hospitalist that took care of you is not available. Once you are discharged, your primary care physician will handle any further medical issues. Please note that NO REFILLS for any discharge medications will be authorized once you are discharged, as it is imperative that you return to your primary care physician (or establish a relationship with a primary care physician if you do not have one) for your aftercare needs so that they can reassess your need for medications and monitor your lab values.       Time coordinating discharge: Greater than 30 minutes  Signed:  Branndon Tuite  [facility] 05/06/2022, 11:47 AM   Pager on [email or web address]. If 7PM-7AM, please contact night-coverage at [email or web address]

## 2022-05-06 NOTE — Clinical Social Work Note (Signed)
  Transition of Care [facility]) Screening Note   Patient Details  Name: Julian Pineda Date of Birth: Feb 06, 1946   Transition of Care [facility]) CM/SW Contact:    Eileen Stanford, LCSW Phone IYMEBR:[phone] 05/06/2022, 11:49 AM    Transition of Care Department [facility]) has reviewed patient and no TOC needs have been identified at this time. We will continue to monitor patient advancement through interdisciplinary progression rounds. If new patient transition needs arise, please place a TOC consult.

## 2022-05-06 NOTE — Progress Notes (Signed)
Per Dr Mal Misty, dc tele monitoring

## 2022-05-06 NOTE — Evaluation (Signed)
Physical Therapy Evaluation Patient Details Name: Julian Pineda MRN: [id number] DOB: [birth year] Today's Date: 05/06/2022  History of Present Illness  Pt is a 76 y.o. male presenting to hospital with near syncope.  Per H&P: "He got up took a shower and got very weak lightheaded and sweaty and passed out.  He passed out briefly.  His wife thinks he did anyway he did not think he passed out himself.  He feels okay now.  Just weak.  He is not eating very much at all and has not eaten much for some time because he gets nauseated very easily.  He recently had an upper endoscopy which showed Barrett's esophagus."  Pt admitted with syncope, abnormal LFT's, AKI, and positive D-dimer.  PMH includes htn, HLD, RA, prostate CA, skin CA, alcohol abuse, syncope, achilles tendon sx, carpal tunnel release, L THA 2022, L TKA 2014.  Clinical Impression  Prior to hospital admission, pt was independent with functional mobility and active; lives with his wife in 1 level home.  Nurse reports orthostatics taken this morning without any significant findings.  Currently pt is independent with bed mobility, transfers, and ambulation 350 feet (no AD use).  No loss of balance with functional mobility.  Pt's HR and O2 sats on room air WFL during sessions activities.  Pt reports no adverse symptoms during session.  No acute PT needs identified; will sign off (pt reports being at baseline level of functional mobility and in agreement).   Recommendations for follow up therapy are one component of a multi-disciplinary discharge planning process, led by the attending physician.  Recommendations may be updated based on patient status, additional functional criteria and insurance authorization.  Follow Up Recommendations No PT follow up      Assistance Recommended at Discharge None  Patient can return home with the following       Equipment Recommendations None recommended by PT  Recommendations for Other Services        Functional Status Assessment Patient has not had a recent decline in their functional status     Precautions / Restrictions Precautions Precautions: None Restrictions Weight Bearing Restrictions: No      Mobility  Bed Mobility Overal bed mobility: Independent             General bed mobility comments: Supine to/from sitting without any noted difficulties    Transfers Overall transfer level: Independent Equipment used: None               General transfer comment: steady safe transfers    Ambulation/Gait Ambulation/Gait assistance: Independent Gait Distance (Feet): 350 Feet Assistive device: None Gait Pattern/deviations: WFL(Within Functional Limits) Gait velocity: normal     General Gait Details: steady ambulation  Stairs            Wheelchair Mobility    Modified Rankin (Stroke Patients Only)       Balance Overall balance assessment: Independent (No loss of balance with ambulation and head turns R/L/up/down, increasing/decreasing speed, and turning 180 degrees and stopping.)                                           Pertinent Vitals/Pain Pain Assessment Pain Assessment: Faces Faces Pain Scale: Hurts a little bit Pain Location: B knees feeling "stiff" Pain Descriptors / Indicators:  ("stiff") Pain Intervention(s): Limited activity within patient's tolerance, Monitored during session, Repositioned  Home Living Family/patient expects to be discharged to:: Private residence Living Arrangements: Spouse/significant other Available Help at Discharge: Family Type of Home: House Home Access:  (small threshold to enter)       Home Layout: One level        Prior Function Prior Level of Function : Independent/Modified Independent             Mobility Comments: Active; has trainer (to work out); no falls       Hand Dominance        Extremity/Trunk Assessment   Upper Extremity Assessment Upper Extremity  Assessment: Overall WFL for tasks assessed    Lower Extremity Assessment Lower Extremity Assessment: Overall WFL for tasks assessed    Cervical / Trunk Assessment Cervical / Trunk Assessment: Normal  Communication   Communication: No difficulties  Cognition Arousal/Alertness: Awake/alert Behavior During Therapy: WFL for tasks assessed/performed Overall Cognitive Status: Within Functional Limits for tasks assessed                                          General Comments  Nursing cleared pt for participation in physical therapy.  Pt agreeable to PT session.  Pt's wife present during session.    Exercises     Assessment/Plan    PT Assessment Patient does not need any further PT services  PT Problem List         PT Treatment Interventions      PT Goals (Current goals can be found in the Care Plan section)  Acute Rehab PT Goals Patient Stated Goal: to go home PT Goal Formulation: With patient Time For Goal Achievement: 05/20/22 Potential to Achieve Goals: Good    Frequency       Co-evaluation               AM-PAC PT "6 Clicks" Mobility  Outcome Measure Help needed turning from your back to your side while in a flat bed without using bedrails?: None Help needed moving from lying on your back to sitting on the side of a flat bed without using bedrails?: None Help needed moving to and from a bed to a chair (including a wheelchair)?: None Help needed standing up from a chair using your arms (e.g., wheelchair or bedside chair)?: None Help needed to walk in hospital room?: None Help needed climbing 3-5 steps with a railing? : None 6 Click Score: 24    End of Session   Activity Tolerance: Patient tolerated treatment well Patient left: in bed;with call bell/phone within reach;with family/visitor present Nurse Communication: Mobility status PT Visit Diagnosis: Other abnormalities of gait and mobility (R26.89)    Time: 2202-5427 PT Time  Calculation (min) (ACUTE ONLY): 14 min   Charges:   PT Evaluation $PT Eval Low Complexity: 1 Low         Sinead Hockman, PT 05/06/22, 10:42 AM

## 2022-05-07 ENCOUNTER — Telehealth: Payer: Self-pay

## 2022-05-07 NOTE — Telephone Encounter (Signed)
Transition Care Management Unsuccessful Follow-up Telephone Call  Date of discharge and from where:  05/06/22 [facility]  Attempts:  1st Attempt  Reason for unsuccessful TCM follow-up call:  Unable to reach patient. Will follow.

## 2022-05-10 DIAGNOSIS — M79642 Pain in left hand: Secondary | ICD-10-CM | POA: Diagnosis not present

## 2022-05-11 DIAGNOSIS — E663 Overweight: Secondary | ICD-10-CM | POA: Diagnosis not present

## 2022-05-11 DIAGNOSIS — Z79899 Other long term (current) drug therapy: Secondary | ICD-10-CM | POA: Diagnosis not present

## 2022-05-11 DIAGNOSIS — M1991 Primary osteoarthritis, unspecified site: Secondary | ICD-10-CM | POA: Diagnosis not present

## 2022-05-11 DIAGNOSIS — M19049 Primary osteoarthritis, unspecified hand: Secondary | ICD-10-CM | POA: Diagnosis not present

## 2022-05-11 DIAGNOSIS — R5382 Chronic fatigue, unspecified: Secondary | ICD-10-CM | POA: Diagnosis not present

## 2022-05-11 DIAGNOSIS — M0589 Other rheumatoid arthritis with rheumatoid factor of multiple sites: Secondary | ICD-10-CM | POA: Diagnosis not present

## 2022-05-11 DIAGNOSIS — Z6827 Body mass index (BMI) 27.0-27.9, adult: Secondary | ICD-10-CM | POA: Diagnosis not present

## 2022-05-11 NOTE — Telephone Encounter (Signed)
Transition Care Management Unsuccessful Follow-up Telephone Call  Date of discharge and from where:  05/06/22 [facility]  Attempts:  3rd Attempt  Reason for unsuccessful TCM follow-up call:  Unable to leave message. Voicemail would not connect. 2 calls made previously and one call today. Will follow as appropriate.

## 2022-05-12 NOTE — Telephone Encounter (Signed)
Spoke with patient. Reports labs were drawn with Rheumatology. No hospital follow up scheduled with PCP at this time per patient request. Agrees to follow up with PCP if symptoms present, worsen and as needed. TCM follow up closed.

## 2022-05-18 DIAGNOSIS — M9905 Segmental and somatic dysfunction of pelvic region: Secondary | ICD-10-CM | POA: Diagnosis not present

## 2022-05-18 DIAGNOSIS — M9903 Segmental and somatic dysfunction of lumbar region: Secondary | ICD-10-CM | POA: Diagnosis not present

## 2022-05-18 DIAGNOSIS — M5136 Other intervertebral disc degeneration, lumbar region: Secondary | ICD-10-CM | POA: Diagnosis not present

## 2022-05-18 DIAGNOSIS — M62830 Muscle spasm of back: Secondary | ICD-10-CM | POA: Diagnosis not present

## 2022-05-26 DIAGNOSIS — R7989 Other specified abnormal findings of blood chemistry: Secondary | ICD-10-CM | POA: Diagnosis not present

## 2022-05-26 LAB — BASIC METABOLIC PANEL
BUN: 17 (ref 4–21)
CO2: 22 (ref 13–22)
Chloride: 104 (ref 99–108)
Creatinine: 1.1 (ref <=1.3)
Glucose: 94
Potassium: 4.3 mEq/L (ref 3.5–5.1)
Sodium: 141 (ref 137–147)

## 2022-05-26 LAB — HEPATIC FUNCTION PANEL
ALT: 22 U/L (ref 10–40)
AST: 22 (ref 14–40)
Alkaline Phosphatase: 67 (ref 25–125)
Bilirubin, Total: 0.4

## 2022-05-26 LAB — COMPREHENSIVE METABOLIC PANEL
Albumin: 4.6 (ref 3.5–5.0)
Calcium: 9.3 (ref 8.7–10.7)
eGFR: 73

## 2022-06-01 ENCOUNTER — Encounter: Payer: Self-pay | Admitting: Family Medicine

## 2022-06-02 NOTE — Telephone Encounter (Signed)
Patient is scheduled to see the provider this week.  Julian Pineda,cma

## 2022-06-02 NOTE — Telephone Encounter (Signed)
He needs an office visit to discuss this and to determine the best management strategy for this.

## 2022-06-04 ENCOUNTER — Ambulatory Visit (INDEPENDENT_AMBULATORY_CARE_PROVIDER_SITE_OTHER): Payer: PPO | Admitting: Family Medicine

## 2022-06-04 ENCOUNTER — Encounter: Payer: Self-pay | Admitting: Family Medicine

## 2022-06-04 VITALS — BP 118/70 | HR 64 | Temp 98.2°F | Ht 70.0 in | Wt 192.0 lb

## 2022-06-04 DIAGNOSIS — R55 Syncope and collapse: Secondary | ICD-10-CM

## 2022-06-04 DIAGNOSIS — F419 Anxiety disorder, unspecified: Secondary | ICD-10-CM | POA: Diagnosis not present

## 2022-06-04 DIAGNOSIS — N179 Acute kidney failure, unspecified: Secondary | ICD-10-CM

## 2022-06-04 DIAGNOSIS — R7989 Other specified abnormal findings of blood chemistry: Secondary | ICD-10-CM | POA: Diagnosis not present

## 2022-06-04 DIAGNOSIS — Z789 Other specified health status: Secondary | ICD-10-CM

## 2022-06-04 DIAGNOSIS — E785 Hyperlipidemia, unspecified: Secondary | ICD-10-CM | POA: Diagnosis not present

## 2022-06-04 MED ORDER — HYDROXYZINE HCL 10 MG PO TABS: 10.0000 mg | ORAL_TABLET | Freq: Three times a day (TID) | ORAL | 0 refills | Status: DC | PRN

## 2022-06-04 NOTE — Assessment & Plan Note (Signed)
Likely dehydration related.  I encouraged adequate hydration.  He will monitor for any additional lightheadedness.

## 2022-06-04 NOTE — Assessment & Plan Note (Signed)
Patient has cut alcohol down to minimal amounts.  He understands the risk to his liver with further alcohol use.  At this point he is outside the window of withdrawing.  He will continue to try to eliminate alcohol intake.

## 2022-06-04 NOTE — Progress Notes (Signed)
Tommi Rumps, MD Phone: [phone]  Julian Pineda is a 76 y.o. male who presents today for follow-up.  Syncope: Patient had a syncopal episode early in July.  He had gotten dehydrated and had passed out.  He went to the emergency department was found to have hypotension and AKI.  He was admitted briefly and received some IV fluids.  He notes 1 day last week he got a little lightheaded though has not had any additional syncopal episodes.  Notes that his kidney function was back to normal when last checked by rheumatology.  Alcohol abuse: Patient notes increased alcohol intake that was increased in the months preceding his emergency department visit.  He had been sitting around a lot at home after having carpal tunnel surgery and hip replacement surgery.  He had been drinking 3 to 5 glasses of alcohol daily and these may have been more full than a standard drink.  He notes since discharge he has had only a couple of beverages.  His last alcoholic beverage was a little over a week ago.  He notes his prior nausea has improved.  His appetite has improved.  He had some night sweats initially though those have improved.  He was getting a little jumpy at times though that has improved as well over the last several days.  They gave him Valium at discharge from the hospital to taper off of though he did not feel great when taking it so he did not finish the taper.  He has had a little bit of occasional anxiety though nothing significant.  No depression.  He has thought about AA though he does have a friend who went through this previously and has been sober for quite some time.  He has been talking to that friend.  Elevated LFTs: Noted in the hospital.  He had labs rechecked through rheumatology notes those were back down to normal.  They had him stop his Crestor.  He has been taking his Zetia.  Social History   Tobacco Use  Smoking Status Never  Smokeless Tobacco Former   Types: Chew   Quit date:  06/06/2016    Current Outpatient Medications on File Prior to Visit  Medication Sig Dispense Refill   amLODipine (NORVASC) 5 MG tablet TAKE 1 TABLET BY MOUTH ONCE DAILY 90 tablet 3   Ascorbic Acid (VITAMIN C) 1000 MG tablet Take 1,000 mg by mouth daily.     cholecalciferol (VITAMIN D) 1000 units tablet Take 1,000 Units by mouth every morning.      ezetimibe (ZETIA) 10 MG tablet Take 1 tablet (10 mg total) by mouth daily. 90 tablet 3   folic acid (FOLVITE) 1 MG tablet TAKE 1 TABLET EVERY DAY 90 tablet 0   losartan (COZAAR) 100 MG tablet TAKE 1 TABLET BY MOUTH DAILY 90 tablet 1   meloxicam (MOBIC) 15 MG tablet TAKE 1 TABLET BY MOUTH ONCE DAILY WITH FOOD 30 tablet 2   ondansetron (ZOFRAN ODT) 4 MG disintegrating tablet Take 1 tablet (4 mg total) by mouth every 8 (eight) hours as needed for nausea or vomiting. 20 tablet 0   pantoprazole (PROTONIX) 40 MG tablet Take 40 mg by mouth daily.     sildenafil (VIAGRA) 100 MG tablet Take 100 mg by mouth daily as needed.     calcium carbonate (TUMS) 500 MG chewable tablet 1 tablet Orally Four times a day     No current facility-administered medications on file prior to visit.     ROS  see history of present illness  Objective  Physical Exam Vitals:   06/04/22 1427  BP: 118/70  Pulse: 64  Temp: 98.2 F (36.8 C)  SpO2: 97%    BP Readings from Last 3 Encounters:  06/04/22 118/70  05/06/22 123/75  01/08/22 124/62   Wt Readings from Last 3 Encounters:  06/04/22 192 lb (87.1 kg)  05/05/22 188 lb (85.3 kg)  01/08/22 194 lb 2 oz (88.1 kg)    Physical Exam Constitutional:      General: He is not in acute distress.    Appearance: He is not diaphoretic.  Cardiovascular:     Rate and Rhythm: Normal rate and regular rhythm.     Heart sounds: Normal heart sounds.  Pulmonary:     Effort: Pulmonary effort is normal.     Breath sounds: Normal breath sounds.  Skin:    General: Skin is warm and dry.  Neurological:     Mental Status: He is  alert.      Assessment/Plan: Please see individual problem list.  Problem List Items Addressed This Visit     Alcohol use (Chronic)    Patient has cut alcohol down to minimal amounts.  He understands the risk to his liver with further alcohol use.  At this point he is outside the window of withdrawing.  He will continue to try to eliminate alcohol intake.      HLD (hyperlipidemia) - Primary (Chronic)    Check lipid panel today.  Once that returns we can determine if we need to go back on the Crestor.  He will continue Zetia 10 mg once daily.      Relevant Orders   Lipid panel   Abnormal LFTs    Possibly elevated related to his dehydration and his alcohol intake.  We will request the labs from his rheumatologist.      AKI (acute kidney injury) ([place])    We discussed adequate hydration particularly when he goes out in the heat and plays golf or does other activities in the heat.  We will request his lab work from his rheumatologist.      Anxiety    Minimal at times after significantly reducing alcohol intake.  Discussed this may get progressively better as he gets further out from his excessive alcohol intake.  We discussed a trial of hydroxyzine to use only as needed for acute anxiety.  Discussed risk of drowsiness with this and he was advised not to drive if he is drowsy while taking this.      Relevant Medications   hydrOXYzine (ATARAX) 10 MG tablet   Syncope    Likely dehydration related.  I encouraged adequate hydration.  He will monitor for any additional lightheadedness.       Return in about 3 months (around 09/04/2022).   Tommi Rumps, MD [place]

## 2022-06-04 NOTE — Assessment & Plan Note (Signed)
Minimal at times after significantly reducing alcohol intake.  Discussed this may get progressively better as he gets further out from his excessive alcohol intake.  We discussed a trial of hydroxyzine to use only as needed for acute anxiety.  Discussed risk of drowsiness with this and he was advised not to drive if he is drowsy while taking this.

## 2022-06-04 NOTE — Assessment & Plan Note (Signed)
We discussed adequate hydration particularly when he goes out in the heat and plays golf or does other activities in the heat.  We will request his lab work from his rheumatologist.

## 2022-06-04 NOTE — Assessment & Plan Note (Signed)
Check lipid panel today.  Once that returns we can determine if we need to go back on the Crestor.  He will continue Zetia 10 mg once daily.

## 2022-06-04 NOTE — Patient Instructions (Signed)
Nice to see you. You can try the hydroxyzine for any anxiety. Please monitor for drowsiness with this. If you get excessively drowsy please stop the medication.

## 2022-06-04 NOTE — Assessment & Plan Note (Signed)
Possibly elevated related to his dehydration and his alcohol intake.  We will request the labs from his rheumatologist.

## 2022-06-05 LAB — LIPID PANEL
Cholesterol: 192 mg/dL (ref <200)
HDL: 68 mg/dL (ref >=40)
LDL Cholesterol (Calc): 103 mg/dL (calc) — ABNORMAL HIGH
Non-HDL Cholesterol (Calc): 124 mg/dL (calc) (ref <130)
Total CHOL/HDL Ratio: 2.8 (calc) (ref <5.0)
Triglycerides: 118 mg/dL (ref <150)

## 2022-06-07 ENCOUNTER — Other Ambulatory Visit: Payer: Self-pay

## 2022-06-07 DIAGNOSIS — E785 Hyperlipidemia, unspecified: Secondary | ICD-10-CM

## 2022-06-07 MED ORDER — ROSUVASTATIN CALCIUM 10 MG PO TABS: 10.0000 mg | ORAL_TABLET | Freq: Every day | ORAL | 1 refills | Status: DC

## 2022-06-15 DIAGNOSIS — M9905 Segmental and somatic dysfunction of pelvic region: Secondary | ICD-10-CM | POA: Diagnosis not present

## 2022-06-15 DIAGNOSIS — M5136 Other intervertebral disc degeneration, lumbar region: Secondary | ICD-10-CM | POA: Diagnosis not present

## 2022-06-15 DIAGNOSIS — M9903 Segmental and somatic dysfunction of lumbar region: Secondary | ICD-10-CM | POA: Diagnosis not present

## 2022-06-15 DIAGNOSIS — M62830 Muscle spasm of back: Secondary | ICD-10-CM | POA: Diagnosis not present

## 2022-06-21 DIAGNOSIS — L590 Erythema ab igne [dermatitis ab igne]: Secondary | ICD-10-CM | POA: Diagnosis not present

## 2022-06-21 DIAGNOSIS — D2262 Melanocytic nevi of left upper limb, including shoulder: Secondary | ICD-10-CM | POA: Diagnosis not present

## 2022-06-21 DIAGNOSIS — D2272 Melanocytic nevi of left lower limb, including hip: Secondary | ICD-10-CM | POA: Diagnosis not present

## 2022-06-21 DIAGNOSIS — D045 Carcinoma in situ of skin of trunk: Secondary | ICD-10-CM | POA: Diagnosis not present

## 2022-06-21 DIAGNOSIS — L570 Actinic keratosis: Secondary | ICD-10-CM | POA: Diagnosis not present

## 2022-06-21 DIAGNOSIS — Z85828 Personal history of other malignant neoplasm of skin: Secondary | ICD-10-CM | POA: Diagnosis not present

## 2022-06-21 DIAGNOSIS — D2261 Melanocytic nevi of right upper limb, including shoulder: Secondary | ICD-10-CM | POA: Diagnosis not present

## 2022-06-21 DIAGNOSIS — D485 Neoplasm of uncertain behavior of skin: Secondary | ICD-10-CM | POA: Diagnosis not present

## 2022-06-21 DIAGNOSIS — D225 Melanocytic nevi of trunk: Secondary | ICD-10-CM | POA: Diagnosis not present

## 2022-06-28 ENCOUNTER — Ambulatory Visit (INDEPENDENT_AMBULATORY_CARE_PROVIDER_SITE_OTHER): Payer: PPO

## 2022-06-28 VITALS — Ht 70.0 in | Wt 188.0 lb

## 2022-06-28 DIAGNOSIS — Z0000 Encounter for general adult medical examination without abnormal findings: Secondary | ICD-10-CM | POA: Diagnosis not present

## 2022-06-28 NOTE — Patient Instructions (Addendum)
Julian Pineda , Thank you for taking time to come for your Medicare Wellness Visit. I appreciate your ongoing commitment to your health goals. Please review the following plan we discussed and let me know if I can assist you in the future.   These are the goals we discussed:  Goals      Maintain Healthy Lifestyle     Healthy diet Stay hydrated Stay active         This is a list of the screening recommended for you and due dates:  Health Maintenance  Topic Date Due   COVID-19 Vaccine (3 - Pfizer risk series) 07/14/2022*   Pneumonia Vaccine (2 - PPSV23 or PCV20) 08/01/2022*   Zoster (Shingles) Vaccine (1 of 2) 09/04/2022*   Flu Shot  01/30/2023*   Colon Cancer Screening  07/21/2024   Tetanus Vaccine  08/11/2025   Hepatitis C Screening: USPSTF Recommendation to screen - Ages 18-79 yo.  Completed   HPV Vaccine  Aged Out  *Topic was postponed. The date shown is not the original due date.   Preventive Care 53 Years and Older, Male Preventive care refers to lifestyle choices and visits with your health care provider that can promote health and wellness. What does preventive care include? A yearly physical exam. This is also called an annual well check. Dental exams once or twice a year. Routine eye exams. Ask your health care provider how often you should have your eyes checked. Personal lifestyle choices, including: Daily care of your teeth and gums. Regular physical activity. Eating a healthy diet. Avoiding tobacco and drug use. Limiting alcohol use. Practicing safe sex. Taking low doses of aspirin every day. Taking vitamin and mineral supplements as recommended by your health care provider. What happens during an annual well check? The services and screenings done by your health care provider during your annual well check will depend on your age, overall health, lifestyle risk factors, and family history of disease. Counseling  Your health care provider may ask you questions  about your: Alcohol use. Tobacco use. Drug use. Emotional well-being. Home and relationship well-being. Sexual activity. Eating habits. History of falls. Memory and ability to understand (cognition). Work and work Statistician. Screening  You may have the following tests or measurements: Height, weight, and BMI. Blood pressure. Lipid and cholesterol levels. These may be checked every 5 years, or more frequently if you are over 39 years old. Skin check. Lung cancer screening. You may have this screening every year starting at age 27 if you have a 30-pack-year history of smoking and currently smoke or have quit within the past 15 years. Fecal occult blood test (FOBT) of the stool. You may have this test every year starting at age 64. Flexible sigmoidoscopy or colonoscopy. You may have a sigmoidoscopy every 5 years or a colonoscopy every 10 years starting at age 14. Prostate cancer screening. Recommendations will vary depending on your family history and other risks. Hepatitis C blood test. Hepatitis B blood test. Sexually transmitted disease (STD) testing. Diabetes screening. This is done by checking your blood sugar (glucose) after you have not eaten for a while (fasting). You may have this done every 1-3 years. Abdominal aortic aneurysm (AAA) screening. You may need this if you are a current or former smoker. Osteoporosis. You may be screened starting at age 63 if you are at high risk. Talk with your health care provider about your test results, treatment options, and if necessary, the need for more tests. Vaccines  Your  health care provider may recommend certain vaccines, such as: Influenza vaccine. This is recommended every year. Tetanus, diphtheria, and acellular pertussis (Tdap, Td) vaccine. You may need a Td booster every 10 years. Zoster vaccine. You may need this after age 72. Pneumococcal 13-valent conjugate (PCV13) vaccine. One dose is recommended after age 53. Pneumococcal  polysaccharide (PPSV23) vaccine. One dose is recommended after age 4. Talk to your health care provider about which screenings and vaccines you need and how often you need them. This information is not intended to replace advice given to you by your health care provider. Make sure you discuss any questions you have with your health care provider. Document Released: 11/14/2015 Document Revised: 07/07/2016 Document Reviewed: 08/19/2015 Elsevier Interactive Patient Education  2017 [employer] Prevention in the Home Falls can cause injuries. They can happen to people of all ages. There are many things you can do to make your home safe and to help prevent falls. What can I do on the outside of my home? Regularly fix the edges of walkways and driveways and fix any cracks. Remove anything that might make you trip as you walk through a door, such as a raised step or threshold. Trim any bushes or trees on the path to your home. Use bright outdoor lighting. Clear any walking paths of anything that might make someone trip, such as rocks or tools. Regularly check to see if handrails are loose or broken. Make sure that both sides of any steps have handrails. Any raised decks and porches should have guardrails on the edges. Have any leaves, snow, or ice cleared regularly. Use sand or salt on walking paths during winter. Clean up any spills in your garage right away. This includes oil or grease spills. What can I do in the bathroom? Use night lights. Install grab bars by the toilet and in the tub and shower. Do not use towel bars as grab bars. Use non-skid mats or decals in the tub or shower. If you need to sit down in the shower, use a plastic, non-slip stool. Keep the floor dry. Clean up any water that spills on the floor as soon as it happens. Remove soap buildup in the tub or shower regularly. Attach bath mats securely with double-sided non-slip rug tape. Do not have throw rugs and other  things on the floor that can make you trip. What can I do in the bedroom? Use night lights. Make sure that you have a light by your bed that is easy to reach. Do not use any sheets or blankets that are too big for your bed. They should not hang down onto the floor. Have a firm chair that has side arms. You can use this for support while you get dressed. Do not have throw rugs and other things on the floor that can make you trip. What can I do in the kitchen? Clean up any spills right away. Avoid walking on wet floors. Keep items that you use a lot in easy-to-reach places. If you need to reach something above you, use a strong step stool that has a grab bar. Keep electrical cords out of the way. Do not use floor polish or wax that makes floors slippery. If you must use wax, use non-skid floor wax. Do not have throw rugs and other things on the floor that can make you trip. What can I do with my stairs? Do not leave any items on the stairs. Make sure that there are handrails  on both sides of the stairs and use them. Fix handrails that are broken or loose. Make sure that handrails are as long as the stairways. Check any carpeting to make sure that it is firmly attached to the stairs. Fix any carpet that is loose or worn. Avoid having throw rugs at the top or bottom of the stairs. If you do have throw rugs, attach them to the floor with carpet tape. Make sure that you have a light switch at the top of the stairs and the bottom of the stairs. If you do not have them, ask someone to add them for you. What else can I do to help prevent falls? Wear shoes that: Do not have high heels. Have rubber bottoms. Are comfortable and fit you well. Are closed at the toe. Do not wear sandals. If you use a stepladder: Make sure that it is fully opened. Do not climb a closed stepladder. Make sure that both sides of the stepladder are locked into place. Ask someone to hold it for you, if possible. Clearly  mark and make sure that you can see: Any grab bars or handrails. First and last steps. Where the edge of each step is. Use tools that help you move around (mobility aids) if they are needed. These include: Canes. Walkers. Scooters. Crutches. Turn on the lights when you go into a dark area. Replace any light bulbs as soon as they burn out. Set up your furniture so you have a clear path. Avoid moving your furniture around. If any of your floors are uneven, fix them. If there are any pets around you, be aware of where they are. Review your medicines with your doctor. Some medicines can make you feel dizzy. This can increase your chance of falling. Ask your doctor what other things that you can do to help prevent falls. This information is not intended to replace advice given to you by your health care provider. Make sure you discuss any questions you have with your health care provider. Document Released: 08/14/2009 Document Revised: 03/25/2016 Document Reviewed: 11/22/2014 Elsevier Interactive Patient Education  2017 [employer].

## 2022-06-28 NOTE — Progress Notes (Signed)
Subjective:   Julian Pineda is a 76 y.o. male who presents for Medicare Annual/Subsequent preventive examination.  Review of Systems    No ROS.  Medicare Wellness Virtual Visit.  Visual/audio telehealth visit, UTA vital signs.   See social history for additional risk factors.   Cardiac Risk Factors include: advanced age (>63mn, >>80women);male gender;hypertension     Objective:    Today's Vitals   06/28/22 1233  Weight: 188 lb (85.3 kg)  Height: '5\' 10"'  (1.778 m)   Body mass index is 26.98 kg/m.     06/28/2022   12:52 PM 05/05/2022    8:30 PM 05/05/2022    8:11 AM 10/19/2021    9:59 AM 09/16/2021    8:23 AM 08/19/2021    9:12 AM 09/29/2020    8:48 AM  Advanced Directives  Does Patient Have a Medical Advance Directive? Yes Yes No No Yes Yes Yes  Type of AParamedicof AWest Canaveral GrovesLiving will [place]Living will   HSmith RiverLiving will HGilaLiving will HKill Devil HillsLiving will  Does patient want to make changes to medical advance directive? No - Patient declined No - Patient declined     No - Patient declined  Copy of [place] Chart? No - copy requested No - copy requested     No - copy requested  Would patient like information on creating a medical advance directive?  No - Patient declined No - Patient declined No - Patient declined       Current Medications (verified) Outpatient Encounter Medications as of 06/28/2022  Medication Sig   amLODipine (NORVASC) 5 MG tablet TAKE 1 TABLET BY MOUTH ONCE DAILY   Ascorbic Acid (VITAMIN C) 1000 MG tablet Take 1,000 mg by mouth daily.   calcium carbonate (TUMS) 500 MG chewable tablet 1 tablet Orally Four times a day   cholecalciferol (VITAMIN D) 1000 units tablet Take 1,000 Units by mouth every morning.    ezetimibe (ZETIA) 10 MG tablet Take 1 tablet (10 mg total) by mouth daily.   folic acid (FOLVITE) 1 MG  tablet TAKE 1 TABLET EVERY DAY   hydrOXYzine (ATARAX) 10 MG tablet Take 1 tablet (10 mg total) by mouth 3 (three) times daily as needed for anxiety.   losartan (COZAAR) 100 MG tablet TAKE 1 TABLET BY MOUTH DAILY   meloxicam (MOBIC) 15 MG tablet TAKE 1 TABLET BY MOUTH ONCE DAILY WITH FOOD   ondansetron (ZOFRAN ODT) 4 MG disintegrating tablet Take 1 tablet (4 mg total) by mouth every 8 (eight) hours as needed for nausea or vomiting.   pantoprazole (PROTONIX) 40 MG tablet Take 40 mg by mouth daily.   rosuvastatin (CRESTOR) 10 MG tablet Take 1 tablet (10 mg total) by mouth daily.   sildenafil (VIAGRA) 100 MG tablet Take 100 mg by mouth daily as needed.   No facility-administered encounter medications on file as of 06/28/2022.   Allergies (verified) Penicillins   History: Past Medical History:  Diagnosis Date   Arthritis, rheumatoid ([place]    Diverticulosis of colon    GERD (gastroesophageal reflux disease)    "occas"   History of basal cell carcinoma (BCC) excision    09-25-2014  right lower eyelid s/p moh's dx /  left lower eyelid scheduled removal 07-05-2017   History of squamous cell carcinoma excision    01/ 2015  left lateral cheek  s/p  moh's sx   Hypertension  sees Dr. Jackalyn Lombard, [place]   Nocturia    OA (osteoarthritis)    Prostate cancer [facility]) urologist-  dr eskidge/  oncologist- dr [name]   dx 04/ 2018-- Stage T1c, Gleason 3+3,  PSA 7.1,  vol 38.6cc   Rheumatoid arteritis ([place])    rheumotologist-   dr Elpidio Galea   Rosacea    Dr. Sharlett Iles   Syncope 01/27/2018   Vitamin D deficiency    Past Surgical History:  Procedure Laterality Date   ACHILLES TENDON SURGERY Right 1984   CARPAL TUNNEL RELEASE Bilateral 04/26/2022   CHOLECYSTECTOMY N/A 09/02/2021   Procedure: LAPAROSCOPIC CHOLECYSTECTOMY;  Surgeon: Coralie Keens, MD;  Location: [facility];  Service: General;  Laterality: N/A;   COLONOSCOPY  2008   COLONOSCOPY WITH PROPOFOL N/A 11/09/2017   Procedure:  COLONOSCOPY WITH PROPOFOL;  Surgeon: Manya Silvas, MD;  Location: [facility] ENDOSCOPY;  Service: Endoscopy;  Laterality: N/A;   MOHS SURGERY  09-25-2014;  11-27-2013   [facility]   09-25-2014 right lower eyelid /  11-27-2013  left lateral cheek   RADIOACTIVE SEED IMPLANT N/A 06/17/2017   Procedure: RADIOACTIVE SEED IMPLANT/BRACHYTHERAPY IMPLANT;  Surgeon: Festus Aloe, MD;  Location: [facility];  Service: Urology;  Laterality: N/A;   TONSILLECTOMY  child   TOTAL HIP ARTHROPLASTY Left 09/29/2021   Procedure: TOTAL HIP ARTHROPLASTY ANTERIOR APPROACH;  Surgeon: Paralee Cancel, MD;  Location: [facility];  Service: Orthopedics;  Laterality: Left;   TOTAL KNEE ARTHROPLASTY  12/04/2012   Procedure: TOTAL KNEE ARTHROPLASTY;  Surgeon: Vickey Huger, MD;  Location: [facility];  Service: Orthopedics;  Laterality: Left;  left total knee arthroplasty   VARICOSE VEIN SURGERY  2000   left leg   Family History  Problem Relation Age of Onset   Cancer Mother        Multiple Myeloma   Alcohol abuse Father    Arthritis Father    Prostate cancer Brother    Breast cancer Daughter    Hematuria Neg Hx    Sickle cell anemia Neg Hx    Kidney cancer Neg Hx    Bladder Cancer Neg Hx    Social History   Socioeconomic History   Marital status: Married    Spouse name: Not on file   Number of children: Not on file   Years of education: Not on file   Highest education level: Not on file  Occupational History   Not on file  Tobacco Use   Smoking status: Never   Smokeless tobacco: Former    Types: Chew    Quit date: 06/06/2016  Vaping Use   Vaping Use: Never used  Substance and Sexual Activity   Alcohol use: Yes    Comment: 2 liquor/ drinks in 7 days   Drug use: No   Sexual activity: Yes  Other Topics Concern   Not on file  Social History Narrative   Moderate alcohol use   Lives in [place] with wife, has a daughter and son   Pets has a Engineer, structural   Diet: regular     Exercise. Treadmill 5 days a week   Likes to [employer]: Low Risk  (06/28/2022)   Overall Financial Resource Strain (CARDIA)    Difficulty of Paying Living Expenses: Not hard at all  Food Insecurity: No Food Insecurity (06/28/2022)   Hunger Vital Sign    Worried About Running Out of Food in the Last Year:  Never true    Ran Out of Food in the Last Year: Never true  Transportation Needs: No Transportation Needs (06/28/2022)   PRAPARE - Hydrologist (Medical): No    Lack of Transportation (Non-Medical): No  Physical Activity: Sufficiently Active (06/28/2022)   Exercise Vital Sign    Days of Exercise per Week: 4 days    Minutes of Exercise per Session: 60 min  Stress: No Stress Concern Present (06/28/2022)   [place]    Feeling of Stress : Not at all  Social Connections: Unknown (06/28/2022)   Social Connection and Isolation Panel [NHANES]    Frequency of Communication with Friends and Family: Not on file    Frequency of Social Gatherings with Friends and Family: Not on file    Attends Religious Services: Not on file    Active Member of Clubs or Organizations: Not on file    Attends Archivist Meetings: Not on file    Marital Status: Married   Tobacco Counseling Counseling given: Not Answered  Clinical Intake: Pre-visit preparation completed: Yes        Diabetes: No  How often do you need to have someone help you when you read instructions, pamphlets, or other written materials from your doctor or pharmacy?: 1 - Never    Interpreter Needed?: No    Activities of Daily Living    06/28/2022   12:38 PM 05/05/2022    8:30 PM  In your present state of health, do you have any difficulty performing the following activities:  Hearing? 0 0  Vision? 0 0  Difficulty concentrating or making decisions? 0 0  Walking or climbing  stairs? 0 0  Dressing or bathing? 0 0  Doing errands, shopping? 0 0  Preparing Food and eating ? N   Using the Toilet? N   In the past six months, have you accidently leaked urine? N   Do you have problems with loss of bowel control? N   Managing your Medications? N   Managing your Finances? N   Housekeeping or managing your Housekeeping? N     Patient Care Team: Leone Haven, MD as PCP - General (Family Medicine)  Indicate any recent Medical Services you may have received from other than [facility] providers in the past year (date may be approximate).     Assessment:   This is a routine wellness examination for [place].  Virtual Visit via Telephone Note  I connected with  [name] on 06/28/22 at 12:30 PM EDT by telephone and verified that I am speaking with the correct person using two identifiers.  Location: Patient: home Provider: office Persons participating in the virtual visit: patient/Nurse Health Advisor   I discussed the limitations of performing an evaluation and management service by telehealth.We continued and completed visit with audio only. Some vital signs may be absent or patient reported.   Hearing/Vision screen Hearing Screening - Comments:: Patient is able to hear conversational tones without difficulty. No issues reported. Vision Screening - Comments:: Wears reader lenses They have seen their ophthalmologist in the last 12 months.  Dietary issues and exercise activities discussed: Current Exercise Habits: Home exercise routine, Type of exercise: strength training/weights;stretching (Golf. Physical training.), Time (Minutes): 60, Frequency (Times/Week): 4, Weekly Exercise (Minutes/Week): 240, Intensity: Mild  Healthy diet Good water intake   Goals Addressed             This Visit's Progress  Maintain Healthy Lifestyle       Healthy diet Stay hydrated Stay active        Depression Screen    06/28/2022   12:45 PM 06/04/2022    2:30  PM 09/07/2021    3:23 PM 09/29/2020    8:47 AM 06/23/2020    2:47 PM 11/16/2019    3:36 PM 09/25/2019    8:51 AM  PHQ 2/9 Scores  PHQ - 2 Score 0 1 0 0 0 5 0  PHQ- 9 Score      11     Fall Risk    06/28/2022   12:43 PM 06/04/2022    2:30 PM 09/29/2020    8:50 AM 06/23/2020    2:43 PM 12/17/2019   10:56 AM  Fall Risk   Falls in the past year? 0 0 0 0 0  Number falls in past yr: 0 0 0 0 0  Injury with Fall? 0 0 0 0   Risk for fall due to :  No Fall Risks     Follow up Falls evaluation completed Falls evaluation completed Falls evaluation completed Falls evaluation completed Falls evaluation completed    [place]: Home free of loose throw rugs in walkways, pet beds, electrical cords, etc? Yes  Adequate lighting in your home to reduce risk of falls? Yes   ASSISTIVE DEVICES UTILIZED TO PREVENT FALLS: Life alert? No  Use of a cane, walker or w/c? No  Grab bars in the bathroom? Yes  Shower chair or bench in shower? No  Elevated toilet seat or a handicapped toilet? No   TIMED UP AND GO: Was the test performed? No .   Cognitive Function:    09/19/2017    8:45 AM  MMSE - Mini Mental State Exam  Orientation to time 5  Orientation to Place 5  Registration 3  Attention/ Calculation 5  Recall 3  Language- name 2 objects 2  Language- repeat 1  Language- follow 3 step command 3  Language- read & follow direction 1  Write a sentence 1  Copy design 1  Total score 30        06/28/2022   12:44 PM 09/25/2019    8:53 AM 09/20/2018    8:48 AM  6CIT Screen  What Year? 0 points 0 points 0 points  What month? 0 points 0 points 0 points  What time? 0 points 0 points 0 points  Count back from 20 0 points 0 points 0 points  Months in reverse 0 points 0 points 0 points  Repeat phrase 0 points 0 points 0 points  Total Score 0 points 0 points 0 points    Immunizations Immunization History  Administered Date(s) Administered   DTaP 02/19/2010    PFIZER(Purple Top)SARS-COV-2 Vaccination 12/14/2019, 01/09/2020   Pneumococcal Conjugate-13 09/26/2019   Tdap 08/12/2015   Pneumococcal vaccine status: Due, Education has been provided regarding the importance of this vaccine. Advised may receive this vaccine at local pharmacy or Health Dept. Aware to provide a copy of the vaccination record if obtained from local pharmacy or Health Dept. Verbalized acceptance and understanding.  Covid-19 vaccine status: Completed vaccines x2.  Screening Tests Health Maintenance  Topic Date Due   COVID-19 Vaccine (3 - Pfizer risk series) 07/14/2022 (Originally 02/06/2020)   Pneumonia Vaccine 8+ Years old (2 - PPSV23 or PCV20) 08/01/2022 (Originally 09/25/2020)   Zoster Vaccines- Shingrix (1 of 2) 09/04/2022 (Originally 01/16/1965)   INFLUENZA VACCINE  01/30/2023 (  Originally 06/01/2022)   COLONOSCOPY (Pts 45-44yr Insurance coverage will need to be confirmed)  07/21/2024   TETANUS/TDAP  08/11/2025   Hepatitis C Screening  Completed   HPV VACCINES  Aged Out   Health Maintenance There are no preventive care reminders to display for this patient.  Lung Cancer Screening: (Low Dose CT Chest recommended if Age 74-80years, 30 pack-year currently smoking OR have quit w/in 15years.) does not qualify.   Vision Screening: Recommended annual ophthalmology exams for early detection of glaucoma and other disorders of the eye.  Dental Screening: Recommended annual dental exams for proper oral hygiene  Community Resource Referral / Chronic Care Management: CRR required this visit?  No   CCM required this visit?  No      Plan:     I have personally reviewed and noted the following in the patient's chart:   Medical and social history Use of alcohol, tobacco or illicit drugs  Current medications and supplements including opioid prescriptions. Patient is not currently taking opioid prescriptions. Functional ability and status Nutritional status Physical  activity Advanced directives List of other physicians Hospitalizations, surgeries, and ER visits in previous 12 months Vitals Screenings to include cognitive, depression, and falls Referrals and appointments  In addition, I have reviewed and discussed with patient certain preventive protocols, quality metrics, and best practice recommendations. A written personalized care plan for preventive services as well as general preventive health recommendations were provided to patient.     OVarney Biles LPN   [id number]

## 2022-07-02 DIAGNOSIS — N5201 Erectile dysfunction due to arterial insufficiency: Secondary | ICD-10-CM | POA: Diagnosis not present

## 2022-07-02 DIAGNOSIS — Z8546 Personal history of malignant neoplasm of prostate: Secondary | ICD-10-CM | POA: Diagnosis not present

## 2022-07-02 DIAGNOSIS — R3912 Poor urinary stream: Secondary | ICD-10-CM | POA: Diagnosis not present

## 2022-07-06 DIAGNOSIS — M9905 Segmental and somatic dysfunction of pelvic region: Secondary | ICD-10-CM | POA: Diagnosis not present

## 2022-07-06 DIAGNOSIS — M62830 Muscle spasm of back: Secondary | ICD-10-CM | POA: Diagnosis not present

## 2022-07-06 DIAGNOSIS — M9903 Segmental and somatic dysfunction of lumbar region: Secondary | ICD-10-CM | POA: Diagnosis not present

## 2022-07-06 DIAGNOSIS — M5136 Other intervertebral disc degeneration, lumbar region: Secondary | ICD-10-CM | POA: Diagnosis not present

## 2022-07-15 ENCOUNTER — Telehealth: Payer: Self-pay | Admitting: Family Medicine

## 2022-07-15 DIAGNOSIS — E785 Hyperlipidemia, unspecified: Secondary | ICD-10-CM

## 2022-07-15 NOTE — Addendum Note (Signed)
Addended by: Caryl Bis, Demonte Dobratz G on: 07/15/2022 11:55 AM   Modules accepted: Orders

## 2022-07-15 NOTE — Telephone Encounter (Signed)
Patient has a lab appt 07/20/22, there are no orders in.

## 2022-07-15 NOTE — Telephone Encounter (Signed)
Labs ordered.

## 2022-07-16 ENCOUNTER — Other Ambulatory Visit: Payer: Self-pay | Admitting: Family Medicine

## 2022-07-19 ENCOUNTER — Other Ambulatory Visit: Payer: Self-pay | Admitting: Family Medicine

## 2022-07-20 ENCOUNTER — Other Ambulatory Visit (INDEPENDENT_AMBULATORY_CARE_PROVIDER_SITE_OTHER): Payer: PPO

## 2022-07-20 DIAGNOSIS — E785 Hyperlipidemia, unspecified: Secondary | ICD-10-CM

## 2022-07-20 LAB — HEPATIC FUNCTION PANEL
ALT: 17 U/L (ref 0–53)
AST: 15 U/L (ref 0–37)
Albumin: 3.9 g/dL (ref 3.5–5.2)
Alkaline Phosphatase: 50 U/L (ref 39–117)
Bilirubin, Direct: 0.2 mg/dL (ref 0.0–0.3)
Total Bilirubin: 0.8 mg/dL (ref 0.2–1.2)
Total Protein: 6.3 g/dL (ref 6.0–8.3)

## 2022-07-20 LAB — LDL CHOLESTEROL, DIRECT: Direct LDL: 65 mg/dL

## 2022-07-26 ENCOUNTER — Other Ambulatory Visit: Payer: Self-pay | Admitting: Family

## 2022-07-27 DIAGNOSIS — M62830 Muscle spasm of back: Secondary | ICD-10-CM | POA: Diagnosis not present

## 2022-07-27 DIAGNOSIS — M9905 Segmental and somatic dysfunction of pelvic region: Secondary | ICD-10-CM | POA: Diagnosis not present

## 2022-07-27 DIAGNOSIS — M5136 Other intervertebral disc degeneration, lumbar region: Secondary | ICD-10-CM | POA: Diagnosis not present

## 2022-07-27 DIAGNOSIS — M9903 Segmental and somatic dysfunction of lumbar region: Secondary | ICD-10-CM | POA: Diagnosis not present

## 2022-08-11 DIAGNOSIS — M0589 Other rheumatoid arthritis with rheumatoid factor of multiple sites: Secondary | ICD-10-CM | POA: Diagnosis not present

## 2022-08-17 DIAGNOSIS — M9905 Segmental and somatic dysfunction of pelvic region: Secondary | ICD-10-CM | POA: Diagnosis not present

## 2022-08-17 DIAGNOSIS — M62830 Muscle spasm of back: Secondary | ICD-10-CM | POA: Diagnosis not present

## 2022-08-17 DIAGNOSIS — M9903 Segmental and somatic dysfunction of lumbar region: Secondary | ICD-10-CM | POA: Diagnosis not present

## 2022-08-17 DIAGNOSIS — M5136 Other intervertebral disc degeneration, lumbar region: Secondary | ICD-10-CM | POA: Diagnosis not present

## 2022-08-17 DIAGNOSIS — D045 Carcinoma in situ of skin of trunk: Secondary | ICD-10-CM | POA: Diagnosis not present

## 2022-09-07 DIAGNOSIS — M9905 Segmental and somatic dysfunction of pelvic region: Secondary | ICD-10-CM | POA: Diagnosis not present

## 2022-09-07 DIAGNOSIS — M62830 Muscle spasm of back: Secondary | ICD-10-CM | POA: Diagnosis not present

## 2022-09-07 DIAGNOSIS — M9903 Segmental and somatic dysfunction of lumbar region: Secondary | ICD-10-CM | POA: Diagnosis not present

## 2022-09-07 DIAGNOSIS — M5136 Other intervertebral disc degeneration, lumbar region: Secondary | ICD-10-CM | POA: Diagnosis not present

## 2022-09-10 ENCOUNTER — Ambulatory Visit (INDEPENDENT_AMBULATORY_CARE_PROVIDER_SITE_OTHER): Payer: PPO | Admitting: Family Medicine

## 2022-09-10 ENCOUNTER — Encounter: Payer: Self-pay | Admitting: Family Medicine

## 2022-09-10 VITALS — BP 120/60 | HR 76 | Temp 98.1°F | Ht 70.0 in | Wt 193.2 lb

## 2022-09-10 DIAGNOSIS — Z0000 Encounter for general adult medical examination without abnormal findings: Secondary | ICD-10-CM

## 2022-09-10 NOTE — Assessment & Plan Note (Signed)
Physical exam completed.  I encouraged healthy diet and exercise.  Prostate cancer screening will continue through urology.  Colon cancer screening is up-to-date.  Patient declines flu and COVID vaccination.  He knows these are available if he changes his mind.  I encouraged him to get the Shingrix vaccine and the pneumonia vaccine.  Discussed we can give him the pneumonia vaccine here though he wants to think about it.  He can get the Shingrix vaccine at the pharmacy.  He is not due for any lab work.  Congratulated on quitting chewing tobacco and reducing his alcohol intake significantly.

## 2022-09-10 NOTE — Progress Notes (Signed)
Tommi Rumps, MD Phone: [phone]  Julian Pineda is a 76 y.o. male who presents today for CPE.  Diet: generally healthy, mostly lean meats, plenty of fruits and vegetables, yogurt, lots of eggs, not many sweets Exercise: gym 2x/week, golf 2x/week, driving range 2x/week Colonoscopy: 07/21/21, no repeat needed per GI Prostate cancer screening: UTD through urology Family history-  Prostate cancer: brother  Colon cancer: no Vaccines-   Flu: declines  Tetanus: UTD  Shingles: due  COVID19: x2, declines further vaccines  Pneumonia: due Hep C Screening: UTd Tobacco use: notes quit chewing tobacco about a year ago Alcohol use: significant reduction, only has 2 at a time when he does drink, will have weeks that he does not drink Illicit Drug use: no Dentist: yes Ophthalmology: yes   Active Ambulatory Problems    Diagnosis Date Noted   Rheumatoid arthritis ([place]) 02/16/2012   Vitamin D deficiency disease 02/16/2012   Hypertension 02/16/2012   Preop examination 11/10/2012   Routine general medical examination at a health care facility 07/10/2016   Personal history of other malignant neoplasm of skin 11/20/2013   S/P TKR (total knee replacement) 01/16/2013   History of prostate cancer 08/24/2016   Low back pain 01/25/2017   Basal cell carcinoma 07/28/2017   Physical deconditioning 05/15/2018   Nausea 03/21/2019   Chronic fatigue 09/10/2019   Alcohol use 11/16/2019   Elevated LDL cholesterol level 01/14/2020   Barrett esophagus 08/21/2021   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   Syncope 05/05/2022   HLD (hyperlipidemia) 05/05/2022   AKI (acute kidney injury) ([place]) 05/05/2022   Abnormal LFTs 05/05/2022   Positive D-dimer 05/05/2022   Anxiety 06/04/2022   Resolved Ambulatory Problems    Diagnosis Date Noted   Osteoarthritis of left knee 08/18/2012   Erectile dysfunction 12/11/2013   Stye 02/08/2017   Syncope 01/27/2018   Anemia 01/27/2018   ARF (acute renal  failure) ([place]) 01/28/2018   Past Medical History:  Diagnosis Date   Arthritis, rheumatoid (HCC)    Diverticulosis of colon    GERD (gastroesophageal reflux disease)    History of basal cell carcinoma (BCC) excision    History of squamous cell carcinoma excision    Nocturia    OA (osteoarthritis)    Prostate cancer [facility]) urologist-  dr eskidge/  oncologist- dr [name]   Rheumatoid arteritis ([place])    Rosacea    Vitamin D deficiency     Family History  Problem Relation Age of Onset   Cancer Mother        Multiple Myeloma   Alcohol abuse Father    Arthritis Father    Prostate cancer Brother    Breast cancer Daughter    Hematuria Neg Hx    Sickle cell anemia Neg Hx    Kidney cancer Neg Hx    Bladder Cancer Neg Hx     Social History   Socioeconomic History   Marital status: Married    Spouse name: Not on file   Number of children: Not on file   Years of education: Not on file   Highest education level: Not on file  Occupational History   Not on file  Tobacco Use   Smoking status: Never   Smokeless tobacco: Former    Types: Chew    Quit date: 06/06/2016  Vaping Use   Vaping Use: Never used  Substance and Sexual Activity   Alcohol use: Yes    Comment: 2 liquor/ drinks in 7 days   Drug use:  No   Sexual activity: Yes  Other Topics Concern   Not on file  Social History Narrative   Moderate alcohol use   Lives in [place] with wife, has a daughter and son   Pets has a Engineer, structural   Diet: regular    Exercise. Treadmill 5 days a week   Likes to [employer]: Low Risk  (06/28/2022)   Overall Financial Resource Strain (CARDIA)    Difficulty of Paying Living Expenses: Not hard at all  Food Insecurity: No Food Insecurity (06/28/2022)   Hunger Vital Sign    Worried About Running Out of Food in the Last Year: Never true    Ran Out of Food in the Last Year: Never true  Transportation Needs: No  Transportation Needs (06/28/2022)   PRAPARE - Hydrologist (Medical): No    Lack of Transportation (Non-Medical): No  Physical Activity: Sufficiently Active (06/28/2022)   Exercise Vital Sign    Days of Exercise per Week: 4 days    Minutes of Exercise per Session: 60 min  Stress: No Stress Concern Present (06/28/2022)   [place]    Feeling of Stress : Not at all  Social Connections: Unknown (06/28/2022)   Social Connection and Isolation Panel [NHANES]    Frequency of Communication with Friends and Family: Not on file    Frequency of Social Gatherings with Friends and Family: Not on file    Attends Religious Services: Not on file    Active Member of Clubs or Organizations: Not on file    Attends Archivist Meetings: Not on file    Marital Status: Married  Intimate Partner Violence: Not At Risk (06/28/2022)   Humiliation, Afraid, Rape, and Kick questionnaire    Fear of Current or Ex-Partner: No    Emotionally Abused: No    Physically Abused: No    Sexually Abused: No    ROS  General:  Negative for nexplained weight loss, fever Skin: Negative for new or changing mole, sore that won't heal HEENT: Negative for trouble hearing, trouble seeing, ringing in ears, mouth sores, hoarseness, change in voice, dysphagia. CV:  Negative for chest pain, dyspnea, edema, palpitations Resp: Negative for cough, dyspnea, hemoptysis GI: Negative for nausea, vomiting, diarrhea, constipation, abdominal pain, melena, hematochezia. GU: Negative for dysuria, incontinence, urinary hesitance, hematuria, vaginal or penile discharge, polyuria, sexual difficulty, lumps in testicle or breasts MSK: Positive for joint aches, Negative for muscle cramps or aches Neuro: Negative for headaches, weakness, numbness, dizziness, passing out/fainting Psych: Negative for depression, anxiety, memory  problems  Objective  Physical Exam Vitals:   09/10/22 1029  BP: 120/60  Pulse: 76  Temp: 98.1 F (36.7 C)  SpO2: 98%    BP Readings from Last 3 Encounters:  09/10/22 120/60  06/04/22 118/70  05/06/22 123/75   Wt Readings from Last 3 Encounters:  09/10/22 193 lb 3.2 oz (87.6 kg)  06/28/22 188 lb (85.3 kg)  06/04/22 192 lb (87.1 kg)    Physical Exam Constitutional:      General: He is not in acute distress.    Appearance: He is not diaphoretic.  HENT:     Head: Normocephalic and atraumatic.  Cardiovascular:     Rate and Rhythm: Normal rate and regular rhythm.     Heart sounds: Normal heart sounds.  Pulmonary:     Effort:  Pulmonary effort is normal.     Breath sounds: Normal breath sounds.  Abdominal:     General: Bowel sounds are normal. There is no distension.     Palpations: Abdomen is soft.     Tenderness: There is no abdominal tenderness.  Musculoskeletal:     Right lower leg: No edema.     Left lower leg: No edema.  Lymphadenopathy:     Cervical: No cervical adenopathy.  Skin:    General: Skin is warm and dry.  Neurological:     Mental Status: He is alert.  Psychiatric:        Mood and Affect: Mood normal.      Assessment/Plan:   Problem List Items Addressed This Visit     Routine general medical examination at a health care facility - Primary    Physical exam completed.  I encouraged healthy diet and exercise.  Prostate cancer screening will continue through urology.  Colon cancer screening is up-to-date.  Patient declines flu and COVID vaccination.  He knows these are available if he changes his mind.  I encouraged him to get the Shingrix vaccine and the pneumonia vaccine.  Discussed we can give him the pneumonia vaccine here though he wants to think about it.  He can get the Shingrix vaccine at the pharmacy.  He is not due for any lab work.  Congratulated on quitting chewing tobacco and reducing his alcohol intake significantly.       Return in  about 6 months (around 03/11/2023) for HTN.   Tommi Rumps, MD [place]

## 2022-09-28 DIAGNOSIS — M9903 Segmental and somatic dysfunction of lumbar region: Secondary | ICD-10-CM | POA: Diagnosis not present

## 2022-09-28 DIAGNOSIS — M5136 Other intervertebral disc degeneration, lumbar region: Secondary | ICD-10-CM | POA: Diagnosis not present

## 2022-09-28 DIAGNOSIS — M9905 Segmental and somatic dysfunction of pelvic region: Secondary | ICD-10-CM | POA: Diagnosis not present

## 2022-09-28 DIAGNOSIS — M62830 Muscle spasm of back: Secondary | ICD-10-CM | POA: Diagnosis not present

## 2022-10-01 ENCOUNTER — Other Ambulatory Visit: Payer: Self-pay | Admitting: Family Medicine

## 2022-10-12 ENCOUNTER — Other Ambulatory Visit: Payer: Self-pay | Admitting: Cardiovascular Disease

## 2022-10-19 DIAGNOSIS — M5136 Other intervertebral disc degeneration, lumbar region: Secondary | ICD-10-CM | POA: Diagnosis not present

## 2022-10-19 DIAGNOSIS — M9903 Segmental and somatic dysfunction of lumbar region: Secondary | ICD-10-CM | POA: Diagnosis not present

## 2022-10-19 DIAGNOSIS — M9905 Segmental and somatic dysfunction of pelvic region: Secondary | ICD-10-CM | POA: Diagnosis not present

## 2022-10-19 DIAGNOSIS — M62830 Muscle spasm of back: Secondary | ICD-10-CM | POA: Diagnosis not present

## 2022-11-09 DIAGNOSIS — M5136 Other intervertebral disc degeneration, lumbar region: Secondary | ICD-10-CM | POA: Diagnosis not present

## 2022-11-09 DIAGNOSIS — M9903 Segmental and somatic dysfunction of lumbar region: Secondary | ICD-10-CM | POA: Diagnosis not present

## 2022-11-09 DIAGNOSIS — M9905 Segmental and somatic dysfunction of pelvic region: Secondary | ICD-10-CM | POA: Diagnosis not present

## 2022-11-09 DIAGNOSIS — M62830 Muscle spasm of back: Secondary | ICD-10-CM | POA: Diagnosis not present

## 2022-11-11 DIAGNOSIS — M1991 Primary osteoarthritis, unspecified site: Secondary | ICD-10-CM | POA: Diagnosis not present

## 2022-11-11 DIAGNOSIS — Z79899 Other long term (current) drug therapy: Secondary | ICD-10-CM | POA: Diagnosis not present

## 2022-11-11 DIAGNOSIS — M0589 Other rheumatoid arthritis with rheumatoid factor of multiple sites: Secondary | ICD-10-CM | POA: Diagnosis not present

## 2022-11-11 DIAGNOSIS — M19049 Primary osteoarthritis, unspecified hand: Secondary | ICD-10-CM | POA: Diagnosis not present

## 2022-11-11 DIAGNOSIS — E663 Overweight: Secondary | ICD-10-CM | POA: Diagnosis not present

## 2022-11-11 DIAGNOSIS — R5382 Chronic fatigue, unspecified: Secondary | ICD-10-CM | POA: Diagnosis not present

## 2022-11-11 DIAGNOSIS — Z6828 Body mass index (BMI) 28.0-28.9, adult: Secondary | ICD-10-CM | POA: Diagnosis not present

## 2022-11-22 ENCOUNTER — Encounter: Payer: Self-pay | Admitting: Family Medicine

## 2022-11-22 ENCOUNTER — Other Ambulatory Visit: Payer: Self-pay

## 2022-11-22 MED ORDER — LOSARTAN POTASSIUM 100 MG PO TABS: 100.0000 mg | ORAL_TABLET | Freq: Every day | ORAL | 1 refills | Status: DC

## 2022-11-25 ENCOUNTER — Other Ambulatory Visit: Payer: Self-pay | Admitting: Family Medicine

## 2022-11-25 DIAGNOSIS — E785 Hyperlipidemia, unspecified: Secondary | ICD-10-CM

## 2022-11-30 DIAGNOSIS — M5136 Other intervertebral disc degeneration, lumbar region: Secondary | ICD-10-CM | POA: Diagnosis not present

## 2022-11-30 DIAGNOSIS — M62830 Muscle spasm of back: Secondary | ICD-10-CM | POA: Diagnosis not present

## 2022-11-30 DIAGNOSIS — M9905 Segmental and somatic dysfunction of pelvic region: Secondary | ICD-10-CM | POA: Diagnosis not present

## 2022-11-30 DIAGNOSIS — M9903 Segmental and somatic dysfunction of lumbar region: Secondary | ICD-10-CM | POA: Diagnosis not present

## 2022-12-21 ENCOUNTER — Other Ambulatory Visit: Payer: Self-pay | Admitting: Family Medicine

## 2022-12-21 DIAGNOSIS — M5136 Other intervertebral disc degeneration, lumbar region: Secondary | ICD-10-CM | POA: Diagnosis not present

## 2022-12-21 DIAGNOSIS — M9903 Segmental and somatic dysfunction of lumbar region: Secondary | ICD-10-CM | POA: Diagnosis not present

## 2022-12-21 DIAGNOSIS — I10 Essential (primary) hypertension: Secondary | ICD-10-CM

## 2022-12-21 DIAGNOSIS — M9905 Segmental and somatic dysfunction of pelvic region: Secondary | ICD-10-CM | POA: Diagnosis not present

## 2022-12-21 DIAGNOSIS — M62830 Muscle spasm of back: Secondary | ICD-10-CM | POA: Diagnosis not present

## 2022-12-23 DIAGNOSIS — D0439 Carcinoma in situ of skin of other parts of face: Secondary | ICD-10-CM | POA: Diagnosis not present

## 2022-12-23 DIAGNOSIS — D2261 Melanocytic nevi of right upper limb, including shoulder: Secondary | ICD-10-CM | POA: Diagnosis not present

## 2022-12-23 DIAGNOSIS — L821 Other seborrheic keratosis: Secondary | ICD-10-CM | POA: Diagnosis not present

## 2022-12-23 DIAGNOSIS — Z85828 Personal history of other malignant neoplasm of skin: Secondary | ICD-10-CM | POA: Diagnosis not present

## 2022-12-23 DIAGNOSIS — D485 Neoplasm of uncertain behavior of skin: Secondary | ICD-10-CM | POA: Diagnosis not present

## 2022-12-23 DIAGNOSIS — X32XXXA Exposure to sunlight, initial encounter: Secondary | ICD-10-CM | POA: Diagnosis not present

## 2022-12-23 DIAGNOSIS — D225 Melanocytic nevi of trunk: Secondary | ICD-10-CM | POA: Diagnosis not present

## 2022-12-23 DIAGNOSIS — D2272 Melanocytic nevi of left lower limb, including hip: Secondary | ICD-10-CM | POA: Diagnosis not present

## 2022-12-23 DIAGNOSIS — D2262 Melanocytic nevi of left upper limb, including shoulder: Secondary | ICD-10-CM | POA: Diagnosis not present

## 2022-12-23 DIAGNOSIS — L570 Actinic keratosis: Secondary | ICD-10-CM | POA: Diagnosis not present

## 2022-12-27 ENCOUNTER — Telehealth: Payer: Self-pay | Admitting: Family Medicine

## 2022-12-27 NOTE — Telephone Encounter (Signed)
.  Prescription Request  12/27/2022  Is this a "Controlled Substance" medicine? No  LOV: 09/10/2022  What is the name of the medication or equipment? hydrOXYzine (ATARAX) 10 MG tablet  Have you contacted your pharmacy to request a refill? Yes   Which pharmacy would you like this sent to?   [facility] - [place], [place] - [address] [place] [address] Phone: [phone] Fax: [phone]    Patient notified that their request is being sent to the clinical staff for review and that they should receive a response within 2 business days.   Please advise at [place]

## 2022-12-28 ENCOUNTER — Other Ambulatory Visit: Payer: Self-pay | Admitting: Family

## 2022-12-28 MED ORDER — HYDROXYZINE HCL 10 MG PO TABS: ORAL_TABLET | ORAL | 2 refills | Status: DC

## 2023-01-11 DIAGNOSIS — M9903 Segmental and somatic dysfunction of lumbar region: Secondary | ICD-10-CM | POA: Diagnosis not present

## 2023-01-11 DIAGNOSIS — M62830 Muscle spasm of back: Secondary | ICD-10-CM | POA: Diagnosis not present

## 2023-01-11 DIAGNOSIS — M9905 Segmental and somatic dysfunction of pelvic region: Secondary | ICD-10-CM | POA: Diagnosis not present

## 2023-01-11 DIAGNOSIS — M5136 Other intervertebral disc degeneration, lumbar region: Secondary | ICD-10-CM | POA: Diagnosis not present

## 2023-01-12 ENCOUNTER — Other Ambulatory Visit: Payer: Self-pay | Admitting: Cardiovascular Disease

## 2023-01-12 NOTE — Telephone Encounter (Signed)
Pt scheduled on 3/18 

## 2023-01-12 NOTE — Telephone Encounter (Signed)
Please schedule patient 12 month f/u.  Last seen by Dr. Rockey Situ 01-08-22.  Refill pending appt.  Thank you.

## 2023-01-15 NOTE — Progress Notes (Unsigned)
[facility] Office Note  Date:  01/17/2023   ID:  Julian Pineda, DOB [birth year], MRN [id number]  PCP:  Leone Haven, MD   Chief Complaint  Patient presents with   12 month follow up     Patient c/o being anxious at times. Medications reviewed by the patient verbally.     HPI:  Julian Pineda is a 77 year old gentleman with past medical history of Syncope   06/2016 , 12/2017, December 2022 Moderate to severe coronary artery disease cardiac CTA February 2023 Who presents for follow-up of his syncope, coronary artery disease  Seen in clinic March 2023 In follow-up today reports that he feels relatively well Goes to gym 2x a week, other days likes to play golf, coaches golf No chest pain on exertion concerning for angina Rare nausea sx, prior gall bladder issues No significant shortness of breath on exertion  Labs reviewed LDL 65 on Zetia with Crestor 10  Cardiac CTA  Moderate mid LAD disease Severe greater than 70% proximal LAD disease FFR LAD 0.85  No near syncope or syncope episodes  EKG personally reviewed by myself on todays visit Normal sinus rhythm rate 71 bpm no significant ST-T wave changes  Other past medical history reviewed events from October 19, 2021 Reports that he woke up, did not feel well that morning  Despite this went to the gym, did not have breakfast, not much fluid intake Working out at the gym had dizziness symptoms, Went to the bathroom bathroom, urination Was doing machine, worsening symptoms, developed syncope, was on a machine at the time EMTs called, Wife reports that he had second episode while on the gurney with EMTs  Hospital records reviewed  EMS reports initial blood pressure of 90 systolic that improved with 500 cc of IV fluids.  Prior episodes of syncope, August 2017 syncope 2 after playing 18 holes of golf in th heat Was dehydrated, CR 1.7, BUN 24   hospitalized from 01/27/18-01/29/18 for syncope.  Was out to eat,  did not eat much, did not feel well he had had 3 alcoholic beverages   sitting in the car when he started to feel weak and lightheaded with a little bit of light sensitivity Vomited and  passed out.   cold and clammy.  had not had much water to drink prior to the event.    EMS found his blood pressure to be 60/40.  They gave him fluid.   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  PSH:    Past Surgical History:  Procedure Laterality Date   ACHILLES TENDON SURGERY Right 1984   CARPAL TUNNEL RELEASE Bilateral 04/26/2022   CHOLECYSTECTOMY N/A 09/02/2021   Procedure: LAPAROSCOPIC CHOLECYSTECTOMY;  Surgeon: Coralie Keens, MD;  Location: [facility];  Service: General;  Laterality: N/A;   COLONOSCOPY  2008   COLONOSCOPY WITH PROPOFOL N/A 11/09/2017   Procedure: COLONOSCOPY WITH PROPOFOL;  Surgeon: Manya Silvas, MD;  Location: [facility] ENDOSCOPY;  Service: Endoscopy;  Laterality: N/A;   MOHS SURGERY  09-25-2014;  11-27-2013   [place]  09-25-2014 right lower eyelid /  11-27-2013  left lateral cheek   RADIOACTIVE SEED IMPLANT N/A 06/17/2017   Procedure: RADIOACTIVE SEED IMPLANT/BRACHYTHERAPY IMPLANT;  Surgeon: Festus Aloe, MD;  Location: [facility];  Service: Urology;  Laterality: N/A;   TONSILLECTOMY  child   TOTAL HIP ARTHROPLASTY Left 09/29/2021   Procedure: TOTAL HIP ARTHROPLASTY ANTERIOR APPROACH;  Surgeon: Paralee Cancel, MD;  Location: [facility];  Service:  Orthopedics;  Laterality: Left;   TOTAL KNEE ARTHROPLASTY  12/04/2012   Procedure: TOTAL KNEE ARTHROPLASTY;  Surgeon: Vickey Huger, MD;  Location: [place];  Service: Orthopedics;  Laterality: Left;  left total knee arthroplasty   VARICOSE VEIN SURGERY  2000   left leg    Current Outpatient Medications  Medication Sig Dispense Refill   amLODipine (NORVASC) 5 MG tablet TAKE 1 TABLET BY MOUTH ONCE DAILY 90 tablet 3   Ascorbic Acid (VITAMIN C) 1000 MG tablet Take 1,000 mg by mouth daily.     calcium carbonate (TUMS) 500 MG chewable tablet 1 tablet Orally Four times a day     cholecalciferol (VITAMIN D) 1000 units tablet Take 1,000 Units by mouth every morning.      ezetimibe (ZETIA) 10 MG tablet Take 1 tablet (10 mg total) by mouth daily. Additional refill available at office visit 30 tablet 0   folic acid (FOLVITE) 1 MG tablet TAKE 1 TABLET EVERY DAY 90 tablet 0   hydrOXYzine (ATARAX) 10 MG tablet TAKE ONE TABLET BY MOUTH 3 TIMES DAILY AS NEEDED FOR ANXIETY 30 tablet 2   losartan (COZAAR) 100 MG tablet Take 1 tablet (100 mg total) by mouth daily. 90 tablet 1   meloxicam (MOBIC) 15 MG tablet TAKE 1 TABLET BY MOUTH ONCE DAILY WITH FOOD 30 tablet 2   ondansetron (ZOFRAN ODT) 4 MG disintegrating tablet Take 1 tablet (4 mg total) by mouth every 8 (eight) hours as needed for nausea or vomiting. 20 tablet 0   pantoprazole (PROTONIX) 40 MG tablet Take 40 mg by mouth daily.     rosuvastatin (CRESTOR) 10 MG tablet TAKE 1 TABLET BY MOUTH DAILY 90 tablet 1   sildenafil (VIAGRA) 100 MG tablet Take 100 mg by mouth daily as needed.     No current facility-administered medications for this visit.     Allergies:   Penicillins   Social History:  The patient  reports that he has quit smoking. His smoking use included cigarettes. He quit smokeless tobacco use about 6 years ago.  His smokeless tobacco use included chew. He reports current alcohol use. He reports that he does not use drugs.   Family History:    family history includes Alcohol abuse in his father; Arthritis in his father; Breast cancer in his daughter; Cancer in his mother; Prostate cancer in his brother.    Review of Systems: Review of Systems  Constitutional: Negative.   HENT: Negative.    Respiratory: Negative.    Cardiovascular: Negative.   Gastrointestinal: Negative.   Musculoskeletal: Negative.   Neurological: Negative.   Psychiatric/Behavioral: Negative.    All other systems reviewed and are negative.   PHYSICAL EXAM: VS:  BP 120/70 (BP Location: Left Arm, Patient Position: Sitting, Cuff Size: Normal)   Pulse 71   Ht 5\' 10"  (1.778 m)   Wt 196 lb (88.9 kg)   SpO2 98%   BMI 28.12 kg/m  , BMI Body mass index is 28.12 kg/m. Constitutional:  oriented to person, place, and time. No distress.  HENT:  Head:  Grossly normal Eyes:  no discharge. No scleral icterus.  Neck: No JVD, no carotid bruits  Cardiovascular: Regular rate and rhythm, no murmurs appreciated Pulmonary/Chest: Clear to auscultation bilaterally, no wheezes or rails Abdominal: Soft.  no distension.  no tenderness.  Musculoskeletal: Normal range of motion Neurological:  normal muscle tone. Coordination normal. No atrophy Skin: Skin warm and dry Psychiatric: normal affect, pleasant  Recent Labs: 05/26/2022: BUN 17; Creatinine 1.1; Potassium 4.3; Sodium 141 07/20/2022: ALT 17    Lipid Panel Lab Results  Component Value Date   CHOL 192 06/04/2022   HDL 68 06/04/2022   LDLCALC 103 (H) 06/04/2022   TRIG 118 06/04/2022      Wt Readings from Last 3 Encounters:  01/17/23 196 lb (88.9 kg)  09/10/22 193 lb 3.2 oz (87.6 kg)  06/28/22 188 lb (85.3 kg)     ASSESSMENT AND PLAN:  Coronary artery disease with stable angina Cardiac CTA showing significant three-vessel coronary disease Denies anginal symptoms, previously declined cardiac cath position Cholesterol at goal Again discussed anginal symptoms to watch for, active at baseline, goes to the  gym For any anginal symptoms, would arrange cardiac catheterization  Hyperlipidemia Goal LDL less than 60 For now continue Crestor 10 with Zetia 10  Syncope, unspecified syncope type  2 episodes several years ago possible vasovagal etiology, 1 with dehydration, 1 possible alcohol involvement with poor fluid intake Reports that he stopped drinking alcohol, no further episodes Recommend stay hydrated, blood pressure stable  Malignant neoplasm of prostate ([place]) Managed by primary care  Essential hypertension Blood pressure is well controlled on today's visit. No changes made to the medications.    Total encounter time more than 30 minutes  Greater than 50% was spent in counseling and coordination of care with the patient    No orders of the defined types were placed in this encounter.    Signed, Esmond Plants, M.D., Ph.D. 01/17/2023  [place], [place]

## 2023-01-17 ENCOUNTER — Encounter: Payer: Self-pay | Admitting: Cardiovascular Disease

## 2023-01-17 ENCOUNTER — Ambulatory Visit: Payer: PPO | Attending: Cardiovascular Disease | Admitting: Cardiovascular Disease

## 2023-01-17 VITALS — BP 120/70 | HR 71 | Ht 70.0 in | Wt 196.0 lb

## 2023-01-17 DIAGNOSIS — R55 Syncope and collapse: Secondary | ICD-10-CM

## 2023-01-17 DIAGNOSIS — I25118 Atherosclerotic heart disease of native coronary artery with other forms of angina pectoris: Secondary | ICD-10-CM | POA: Diagnosis not present

## 2023-01-17 DIAGNOSIS — I10 Essential (primary) hypertension: Secondary | ICD-10-CM

## 2023-01-17 DIAGNOSIS — E7800 Pure hypercholesterolemia, unspecified: Secondary | ICD-10-CM | POA: Diagnosis not present

## 2023-01-17 MED ORDER — EZETIMIBE 10 MG PO TABS: 10.0000 mg | ORAL_TABLET | Freq: Every day | ORAL | 3 refills | Status: DC

## 2023-01-17 NOTE — Patient Instructions (Signed)
Medication Instructions:  No changes  If you need a refill on your cardiac medications before your next appointment, please call your pharmacy.   Lab work: No new labs needed  Testing/Procedures: No new testing needed  Follow-Up: At [facility], you and your health needs are our priority.  As part of our continuing mission to provide you with exceptional heart care, we have created designated Provider Care Teams.  These Care Teams include your primary Cardiologist (physician) and Advanced Practice Providers (APPs -  Physician Assistants and Nurse Practitioners) who all work together to provide you with the care you need, when you need it.  You will need a follow up appointment in 12 months  Providers on your designated Care Team:   Christopher Berge, NP Ryan Dunn, PA-C Cadence Furth, PA-C  COVID-19 Vaccine Information can be found at: [email or web address] For questions related to vaccine distribution or appointments, please email [email or web address] or call [phone].   

## 2023-02-03 DIAGNOSIS — R310 Gross hematuria: Secondary | ICD-10-CM | POA: Diagnosis not present

## 2023-02-03 DIAGNOSIS — Z8546 Personal history of malignant neoplasm of prostate: Secondary | ICD-10-CM | POA: Diagnosis not present

## 2023-02-08 DIAGNOSIS — M9903 Segmental and somatic dysfunction of lumbar region: Secondary | ICD-10-CM | POA: Diagnosis not present

## 2023-02-08 DIAGNOSIS — M5136 Other intervertebral disc degeneration, lumbar region: Secondary | ICD-10-CM | POA: Diagnosis not present

## 2023-02-08 DIAGNOSIS — M9905 Segmental and somatic dysfunction of pelvic region: Secondary | ICD-10-CM | POA: Diagnosis not present

## 2023-02-08 DIAGNOSIS — M62830 Muscle spasm of back: Secondary | ICD-10-CM | POA: Diagnosis not present

## 2023-02-22 DIAGNOSIS — M5136 Other intervertebral disc degeneration, lumbar region: Secondary | ICD-10-CM | POA: Diagnosis not present

## 2023-02-22 DIAGNOSIS — M9905 Segmental and somatic dysfunction of pelvic region: Secondary | ICD-10-CM | POA: Diagnosis not present

## 2023-02-22 DIAGNOSIS — M62830 Muscle spasm of back: Secondary | ICD-10-CM | POA: Diagnosis not present

## 2023-02-22 DIAGNOSIS — M9903 Segmental and somatic dysfunction of lumbar region: Secondary | ICD-10-CM | POA: Diagnosis not present

## 2023-03-08 DIAGNOSIS — M5136 Other intervertebral disc degeneration, lumbar region: Secondary | ICD-10-CM | POA: Diagnosis not present

## 2023-03-08 DIAGNOSIS — M62830 Muscle spasm of back: Secondary | ICD-10-CM | POA: Diagnosis not present

## 2023-03-08 DIAGNOSIS — M9903 Segmental and somatic dysfunction of lumbar region: Secondary | ICD-10-CM | POA: Diagnosis not present

## 2023-03-08 DIAGNOSIS — M9905 Segmental and somatic dysfunction of pelvic region: Secondary | ICD-10-CM | POA: Diagnosis not present

## 2023-03-15 ENCOUNTER — Ambulatory Visit (INDEPENDENT_AMBULATORY_CARE_PROVIDER_SITE_OTHER): Payer: PPO | Admitting: Family Medicine

## 2023-03-15 ENCOUNTER — Encounter: Payer: Self-pay | Admitting: Family Medicine

## 2023-03-15 VITALS — BP 118/76 | HR 70 | Temp 97.6°F | Ht 70.0 in | Wt 195.6 lb

## 2023-03-15 DIAGNOSIS — K2270 Barrett's esophagus without dysplasia: Secondary | ICD-10-CM | POA: Diagnosis not present

## 2023-03-15 DIAGNOSIS — I10 Essential (primary) hypertension: Secondary | ICD-10-CM | POA: Diagnosis not present

## 2023-03-15 DIAGNOSIS — R198 Other specified symptoms and signs involving the digestive system and abdomen: Secondary | ICD-10-CM | POA: Diagnosis not present

## 2023-03-15 DIAGNOSIS — E785 Hyperlipidemia, unspecified: Secondary | ICD-10-CM | POA: Diagnosis not present

## 2023-03-15 LAB — COMPREHENSIVE METABOLIC PANEL
ALT: 20 U/L (ref 0–53)
AST: 21 U/L (ref 0–37)
Albumin: 4.1 g/dL (ref 3.5–5.2)
Alkaline Phosphatase: 49 U/L (ref 39–117)
BUN: 25 mg/dL — ABNORMAL HIGH (ref 6–23)
CO2: 27 mEq/L (ref 19–32)
Calcium: 9.3 mg/dL (ref 8.4–10.5)
Chloride: 105 mEq/L (ref 96–112)
Creatinine, Ser: 1.3 mg/dL (ref 0.40–1.50)
GFR: 53.87 mL/min — ABNORMAL LOW (ref >60.00)
Glucose, Bld: 88 mg/dL (ref 70–99)
Potassium: 4.3 mEq/L (ref 3.5–5.1)
Sodium: 140 mEq/L (ref 135–145)
Total Bilirubin: 0.6 mg/dL (ref 0.2–1.2)
Total Protein: 6.2 g/dL (ref 6.0–8.3)

## 2023-03-15 LAB — LDL CHOLESTEROL, DIRECT: Direct LDL: 40 mg/dL

## 2023-03-15 NOTE — Assessment & Plan Note (Signed)
Chronic issue.  Continue Protonix 40 mg daily.

## 2023-03-15 NOTE — Progress Notes (Signed)
[name], MD Phone: [phone]  Julian Pineda is a 77 y.o. male who presents today for f/u.  HYPERTENSION Disease Monitoring Home BP Monitoring not checking at home though notes BP is <130/80 at other doctors offices Chest pain- no    Dyspnea- no Medications Compliance-  taking amlodipine, losartan.  Edema- no BMET    Component Value Date/Time   NA 141 05/26/2022 0000   K 4.3 05/26/2022 0000   CL 104 05/26/2022 0000   CO2 22 05/26/2022 0000   GLUCOSE 100 (H) 05/06/2022 0543   BUN 17 05/26/2022 0000   CREATININE 1.1 05/26/2022 0000   CREATININE 1.06 05/06/2022 0543   CREATININE 1.03 11/10/2012 1537   CALCIUM 9.3 05/26/2022 0000   GFRNONAA >60 05/06/2022 0543   GFRAA >60 01/29/2018 0908   HYPERLIPIDEMIA Symptoms Chest pain on exertion:  no   Medications: Compliance- taking crestor, zetia Right upper quadrant pain- no  Muscle aches- no Lipid Panel     Component Value Date/Time   CHOL 192 06/04/2022 1452   TRIG 118 06/04/2022 1452   HDL 68 06/04/2022 1452   CHOLHDL 2.8 06/04/2022 1452   VLDL 14 04/14/2022 0740   LDLCALC 103 (H) 06/04/2022 1452   LDLDIRECT 65.0 07/20/2022 0838   Indigestion/bowel issues: Patient notes intermittent issues with burping.  He notes no reflux or dysphagia.  He gets a queasy sensation at times after eating.  Occasionally feels some anxiety in his chest after eating.  He notes a lot of bowel movement and all of that will resolve.  He has 1-2 bowel movements per day.  Does have to strain at times.  Occasional hard balls of stool though occasionally it is a soft log of stool.  He feels as though sugar affects this.  He notes all of this started before he had his gallbladder taken out and improved once he had his gallbladder out though has continued to occur.   Social History   Tobacco Use  Smoking Status Former   Types: Cigarettes  Smokeless Tobacco Former   Types: Chew   Quit date: 06/06/2016    Current Outpatient Medications on  File Prior to Visit  Medication Sig Dispense Refill   amLODipine (NORVASC) 5 MG tablet TAKE 1 TABLET BY MOUTH ONCE DAILY 90 tablet 3   Ascorbic Acid (VITAMIN C) 1000 MG tablet Take 1,000 mg by mouth daily.     calcium carbonate (TUMS) 500 MG chewable tablet 1 tablet Orally Four times a day     cholecalciferol (VITAMIN D) 1000 units tablet Take 1,000 Units by mouth every morning.      ezetimibe (ZETIA) 10 MG tablet Take 1 tablet (10 mg total) by mouth daily. Additional refill available at office visit 90 tablet 3   folic acid (FOLVITE) 1 MG tablet TAKE 1 TABLET EVERY DAY 90 tablet 0   hydrOXYzine (ATARAX) 10 MG tablet TAKE ONE TABLET BY MOUTH 3 TIMES DAILY AS NEEDED FOR ANXIETY 30 tablet 2   losartan (COZAAR) 100 MG tablet Take 1 tablet (100 mg total) by mouth daily. 90 tablet 1   meloxicam (MOBIC) 15 MG tablet TAKE 1 TABLET BY MOUTH ONCE DAILY WITH FOOD 30 tablet 2   ondansetron (ZOFRAN ODT) 4 MG disintegrating tablet Take 1 tablet (4 mg total) by mouth every 8 (eight) hours as needed for nausea or vomiting. 20 tablet 0   pantoprazole (PROTONIX) 40 MG tablet Take 40 mg by mouth daily.     rosuvastatin (CRESTOR) 10 MG tablet TAKE  1 TABLET BY MOUTH DAILY 90 tablet 1   sildenafil (VIAGRA) 100 MG tablet Take 100 mg by mouth daily as needed.     No current facility-administered medications on file prior to visit.     ROS see history of present illness  Objective  Physical Exam Vitals:   03/15/23 0905  BP: 118/76  Pulse: 70  Temp: 97.6 F (36.4 C)  SpO2: 99%    BP Readings from Last 3 Encounters:  03/15/23 118/76  01/17/23 120/70  09/10/22 120/60   Wt Readings from Last 3 Encounters:  03/15/23 195 lb 9.6 oz (88.7 kg)  01/17/23 196 lb (88.9 kg)  09/10/22 193 lb 3.2 oz (87.6 kg)    Physical Exam Constitutional:      General: He is not in acute distress.    Appearance: He is not diaphoretic.  Cardiovascular:     Rate and Rhythm: Normal rate and regular rhythm.     Heart  sounds: Normal heart sounds.  Pulmonary:     Effort: Pulmonary effort is normal.     Breath sounds: Normal breath sounds.  Abdominal:     General: Bowel sounds are normal. There is no distension.     Palpations: Abdomen is soft.     Tenderness: There is no abdominal tenderness.  Musculoskeletal:     Right lower leg: No edema.     Left lower leg: No edema.  Skin:    General: Skin is warm and dry.  Neurological:     Mental Status: He is alert.      Assessment/Plan: Please see individual problem list.  Primary hypertension Assessment & Plan: Chronic issue.  Adequately controlled.  Patient will continue losartan 100 mg daily and amlodipine 5 mg daily.  Check labs.  Orders: -     Comprehensive metabolic panel  Hyperlipidemia, unspecified hyperlipidemia type Assessment & Plan: Chronic issue.  Check LDL.  Continue Zetia 10 mg daily and Crestor 10 mg daily.  Orders: -     Comprehensive metabolic panel -     LDL cholesterol, direct  Barrett's esophagus without dysplasia Assessment & Plan: Chronic issue.  Continue Protonix 40 mg daily.   Change in bowel function Assessment & Plan: Chronic issue.  Patient has had chronic issues with his bowels since having his gallbladder taken out.  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  Discussed trying to add a fiber supplement to bulk his stools to provide better bowel movements to see if that helps with his symptoms.  If this is not beneficial we can always try a prescription medication.      Health Maintenance: Patient declines pneumonia vaccine and Shingrix vaccine.  Return in about 6 months (around 09/15/2023) for physical.   [name], MD [facility]

## 2023-03-15 NOTE — Assessment & Plan Note (Signed)
Chronic issue.  Patient has had chronic issues with his bowels since having his gallbladder taken out.  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  Discussed trying to add a fiber supplement to bulk his stools to provide better bowel movements to see if that helps with his symptoms.  If this is not beneficial we can always try a prescription medication.

## 2023-03-15 NOTE — Assessment & Plan Note (Signed)
Chronic issue.  Check LDL.  Continue Zetia 10 mg daily and Crestor 10 mg daily.

## 2023-03-15 NOTE — Patient Instructions (Signed)
Try adding Metamucil or Benefiber over-the-counter to see if that helps with your bowel issues.

## 2023-03-15 NOTE — Assessment & Plan Note (Signed)
Chronic issue.  Adequately controlled.  Patient will continue losartan 100 mg daily and amlodipine 5 mg daily.  Check labs.

## 2023-03-16 ENCOUNTER — Telehealth: Payer: Self-pay

## 2023-03-16 DIAGNOSIS — N179 Acute kidney failure, unspecified: Secondary | ICD-10-CM

## 2023-03-16 DIAGNOSIS — I10 Essential (primary) hypertension: Secondary | ICD-10-CM

## 2023-03-16 DIAGNOSIS — E785 Hyperlipidemia, unspecified: Secondary | ICD-10-CM

## 2023-03-16 NOTE — Telephone Encounter (Signed)
I called Patient to find out how much water he is drinking and someone hung up on me

## 2023-03-16 NOTE — Telephone Encounter (Signed)
-----   Message from Glori Luis, MD sent at 03/16/2023  9:55 AM EDT ----- Please let the patient know that his kidney function is a little worse than his baseline.  How much water has he been drinking recently?  Has he been taking any ibuprofen or Aleve?  His LDL cholesterol is well-controlled.

## 2023-03-16 NOTE — Telephone Encounter (Signed)
Left message to call the office back regarding his lab results. 

## 2023-03-16 NOTE — Telephone Encounter (Signed)
Patient returned office phone call and note was read.  How much water has he been drinking recently?  Patient stated he has been drinking more then ever before. Has he been taking any ibuprofen or Aleve?  No, just Tylenol, about 2 a day.

## 2023-03-17 NOTE — Telephone Encounter (Signed)
Left message to call back because Dr. Birdie Sons would like to know how many bottles or ounces of water he drinks in a day?

## 2023-03-17 NOTE — Telephone Encounter (Signed)
Pt returned [facility] CMA call. Note below was read to pt. As per pt, the total ounces of water he drinks a day its 24 ounces.

## 2023-03-17 NOTE — Telephone Encounter (Signed)
He needs to increase his water intake to 48 ounces daily and have a BMET rechecked in 3-4 weeks. Thanks.

## 2023-03-17 NOTE — Telephone Encounter (Signed)
Called Patient and told him 48 ounces of water daily and scheduled repeat labs on 04/13/23 at 9:45.

## 2023-03-22 ENCOUNTER — Other Ambulatory Visit: Payer: Self-pay | Admitting: Family Medicine

## 2023-03-22 DIAGNOSIS — M62830 Muscle spasm of back: Secondary | ICD-10-CM | POA: Diagnosis not present

## 2023-03-22 DIAGNOSIS — M9903 Segmental and somatic dysfunction of lumbar region: Secondary | ICD-10-CM | POA: Diagnosis not present

## 2023-03-22 DIAGNOSIS — D0439 Carcinoma in situ of skin of other parts of face: Secondary | ICD-10-CM | POA: Diagnosis not present

## 2023-03-22 DIAGNOSIS — E785 Hyperlipidemia, unspecified: Secondary | ICD-10-CM

## 2023-03-22 DIAGNOSIS — M9905 Segmental and somatic dysfunction of pelvic region: Secondary | ICD-10-CM | POA: Diagnosis not present

## 2023-03-22 DIAGNOSIS — M5136 Other intervertebral disc degeneration, lumbar region: Secondary | ICD-10-CM | POA: Diagnosis not present

## 2023-04-12 DIAGNOSIS — M62830 Muscle spasm of back: Secondary | ICD-10-CM | POA: Diagnosis not present

## 2023-04-12 DIAGNOSIS — M5136 Other intervertebral disc degeneration, lumbar region: Secondary | ICD-10-CM | POA: Diagnosis not present

## 2023-04-12 DIAGNOSIS — M9905 Segmental and somatic dysfunction of pelvic region: Secondary | ICD-10-CM | POA: Diagnosis not present

## 2023-04-12 DIAGNOSIS — M9903 Segmental and somatic dysfunction of lumbar region: Secondary | ICD-10-CM | POA: Diagnosis not present

## 2023-04-13 ENCOUNTER — Other Ambulatory Visit (INDEPENDENT_AMBULATORY_CARE_PROVIDER_SITE_OTHER): Payer: PPO

## 2023-04-13 DIAGNOSIS — E785 Hyperlipidemia, unspecified: Secondary | ICD-10-CM | POA: Diagnosis not present

## 2023-04-13 DIAGNOSIS — N179 Acute kidney failure, unspecified: Secondary | ICD-10-CM

## 2023-04-13 DIAGNOSIS — I10 Essential (primary) hypertension: Secondary | ICD-10-CM | POA: Diagnosis not present

## 2023-04-13 LAB — BASIC METABOLIC PANEL
BUN: 19 mg/dL (ref 6–23)
CO2: 26 mEq/L (ref 19–32)
Calcium: 9.4 mg/dL (ref 8.4–10.5)
Chloride: 100 mEq/L (ref 96–112)
Creatinine, Ser: 1.17 mg/dL (ref 0.40–1.50)
GFR: 61.1 mL/min (ref >60.00)
Glucose, Bld: 99 mg/dL (ref 70–99)
Potassium: 4.1 mEq/L (ref 3.5–5.1)
Sodium: 133 mEq/L — ABNORMAL LOW (ref 135–145)

## 2023-04-20 ENCOUNTER — Encounter: Payer: Self-pay | Admitting: *Deleted

## 2023-04-20 ENCOUNTER — Other Ambulatory Visit: Payer: Self-pay | Admitting: *Deleted

## 2023-04-20 DIAGNOSIS — E871 Hypo-osmolality and hyponatremia: Secondary | ICD-10-CM

## 2023-04-21 DIAGNOSIS — H2512 Age-related nuclear cataract, left eye: Secondary | ICD-10-CM | POA: Diagnosis not present

## 2023-04-21 DIAGNOSIS — H2511 Age-related nuclear cataract, right eye: Secondary | ICD-10-CM | POA: Diagnosis not present

## 2023-04-21 DIAGNOSIS — H43813 Vitreous degeneration, bilateral: Secondary | ICD-10-CM | POA: Diagnosis not present

## 2023-05-02 ENCOUNTER — Other Ambulatory Visit (INDEPENDENT_AMBULATORY_CARE_PROVIDER_SITE_OTHER): Payer: PPO

## 2023-05-02 DIAGNOSIS — E871 Hypo-osmolality and hyponatremia: Secondary | ICD-10-CM

## 2023-05-02 LAB — SODIUM: Sodium: 138 mEq/L (ref 135–145)

## 2023-05-03 DIAGNOSIS — M9905 Segmental and somatic dysfunction of pelvic region: Secondary | ICD-10-CM | POA: Diagnosis not present

## 2023-05-03 DIAGNOSIS — M5136 Other intervertebral disc degeneration, lumbar region: Secondary | ICD-10-CM | POA: Diagnosis not present

## 2023-05-03 DIAGNOSIS — M62830 Muscle spasm of back: Secondary | ICD-10-CM | POA: Diagnosis not present

## 2023-05-03 DIAGNOSIS — M9903 Segmental and somatic dysfunction of lumbar region: Secondary | ICD-10-CM | POA: Diagnosis not present

## 2023-05-09 DIAGNOSIS — K219 Gastro-esophageal reflux disease without esophagitis: Secondary | ICD-10-CM | POA: Diagnosis not present

## 2023-05-09 DIAGNOSIS — R110 Nausea: Secondary | ICD-10-CM | POA: Diagnosis not present

## 2023-05-09 DIAGNOSIS — Z9049 Acquired absence of other specified parts of digestive tract: Secondary | ICD-10-CM | POA: Diagnosis not present

## 2023-05-09 DIAGNOSIS — K2270 Barrett's esophagus without dysplasia: Secondary | ICD-10-CM | POA: Diagnosis not present

## 2023-05-09 DIAGNOSIS — R142 Eructation: Secondary | ICD-10-CM | POA: Diagnosis not present

## 2023-05-09 DIAGNOSIS — K449 Diaphragmatic hernia without obstruction or gangrene: Secondary | ICD-10-CM | POA: Diagnosis not present

## 2023-05-09 DIAGNOSIS — R1314 Dysphagia, pharyngoesophageal phase: Secondary | ICD-10-CM | POA: Diagnosis not present

## 2023-05-12 ENCOUNTER — Other Ambulatory Visit: Payer: Self-pay | Admitting: Gastroenterology

## 2023-05-12 DIAGNOSIS — M19049 Primary osteoarthritis, unspecified hand: Secondary | ICD-10-CM | POA: Diagnosis not present

## 2023-05-12 DIAGNOSIS — Z6827 Body mass index (BMI) 27.0-27.9, adult: Secondary | ICD-10-CM | POA: Diagnosis not present

## 2023-05-12 DIAGNOSIS — R1314 Dysphagia, pharyngoesophageal phase: Secondary | ICD-10-CM

## 2023-05-12 DIAGNOSIS — M1991 Primary osteoarthritis, unspecified site: Secondary | ICD-10-CM | POA: Diagnosis not present

## 2023-05-12 DIAGNOSIS — R142 Eructation: Secondary | ICD-10-CM

## 2023-05-12 DIAGNOSIS — K219 Gastro-esophageal reflux disease without esophagitis: Secondary | ICD-10-CM

## 2023-05-12 DIAGNOSIS — K2270 Barrett's esophagus without dysplasia: Secondary | ICD-10-CM

## 2023-05-12 DIAGNOSIS — M0589 Other rheumatoid arthritis with rheumatoid factor of multiple sites: Secondary | ICD-10-CM | POA: Diagnosis not present

## 2023-05-12 DIAGNOSIS — R110 Nausea: Secondary | ICD-10-CM

## 2023-05-12 DIAGNOSIS — N281 Cyst of kidney, acquired: Secondary | ICD-10-CM | POA: Diagnosis not present

## 2023-05-12 DIAGNOSIS — E663 Overweight: Secondary | ICD-10-CM | POA: Diagnosis not present

## 2023-05-12 DIAGNOSIS — R5382 Chronic fatigue, unspecified: Secondary | ICD-10-CM | POA: Diagnosis not present

## 2023-05-12 DIAGNOSIS — Z79899 Other long term (current) drug therapy: Secondary | ICD-10-CM | POA: Diagnosis not present

## 2023-05-12 DIAGNOSIS — N5201 Erectile dysfunction due to arterial insufficiency: Secondary | ICD-10-CM | POA: Diagnosis not present

## 2023-05-12 DIAGNOSIS — R310 Gross hematuria: Secondary | ICD-10-CM | POA: Diagnosis not present

## 2023-05-18 ENCOUNTER — Ambulatory Visit
Admission: RE | Admit: 2023-05-18 | Discharge: 2023-05-18 | Disposition: A | Discharge status: Home / Self Care | Payer: PPO | Source: Ambulatory Visit | Attending: Gastroenterology | Admitting: Gastroenterology

## 2023-05-18 DIAGNOSIS — R142 Eructation: Secondary | ICD-10-CM | POA: Insufficient documentation

## 2023-05-18 DIAGNOSIS — K219 Gastro-esophageal reflux disease without esophagitis: Secondary | ICD-10-CM | POA: Insufficient documentation

## 2023-05-18 DIAGNOSIS — K449 Diaphragmatic hernia without obstruction or gangrene: Secondary | ICD-10-CM | POA: Diagnosis not present

## 2023-05-18 DIAGNOSIS — K2270 Barrett's esophagus without dysplasia: Secondary | ICD-10-CM | POA: Diagnosis not present

## 2023-05-18 DIAGNOSIS — R1314 Dysphagia, pharyngoesophageal phase: Secondary | ICD-10-CM | POA: Insufficient documentation

## 2023-05-18 DIAGNOSIS — R110 Nausea: Secondary | ICD-10-CM | POA: Diagnosis not present

## 2023-05-24 DIAGNOSIS — M5136 Other intervertebral disc degeneration, lumbar region: Secondary | ICD-10-CM | POA: Diagnosis not present

## 2023-05-24 DIAGNOSIS — M9903 Segmental and somatic dysfunction of lumbar region: Secondary | ICD-10-CM | POA: Diagnosis not present

## 2023-05-24 DIAGNOSIS — M9905 Segmental and somatic dysfunction of pelvic region: Secondary | ICD-10-CM | POA: Diagnosis not present

## 2023-05-24 DIAGNOSIS — M62830 Muscle spasm of back: Secondary | ICD-10-CM | POA: Diagnosis not present

## 2023-06-14 DIAGNOSIS — M9905 Segmental and somatic dysfunction of pelvic region: Secondary | ICD-10-CM | POA: Diagnosis not present

## 2023-06-14 DIAGNOSIS — M62830 Muscle spasm of back: Secondary | ICD-10-CM | POA: Diagnosis not present

## 2023-06-14 DIAGNOSIS — M9903 Segmental and somatic dysfunction of lumbar region: Secondary | ICD-10-CM | POA: Diagnosis not present

## 2023-06-14 DIAGNOSIS — M5136 Other intervertebral disc degeneration, lumbar region: Secondary | ICD-10-CM | POA: Diagnosis not present

## 2023-07-05 DIAGNOSIS — M9905 Segmental and somatic dysfunction of pelvic region: Secondary | ICD-10-CM | POA: Diagnosis not present

## 2023-07-05 DIAGNOSIS — M62830 Muscle spasm of back: Secondary | ICD-10-CM | POA: Diagnosis not present

## 2023-07-05 DIAGNOSIS — M9903 Segmental and somatic dysfunction of lumbar region: Secondary | ICD-10-CM | POA: Diagnosis not present

## 2023-07-05 DIAGNOSIS — M5136 Other intervertebral disc degeneration, lumbar region: Secondary | ICD-10-CM | POA: Diagnosis not present

## 2023-07-06 ENCOUNTER — Ambulatory Visit (INDEPENDENT_AMBULATORY_CARE_PROVIDER_SITE_OTHER): Payer: PPO | Admitting: Emergency Medicine

## 2023-07-06 ENCOUNTER — Other Ambulatory Visit: Payer: Self-pay | Admitting: Family Medicine

## 2023-07-06 VITALS — Ht 70.0 in | Wt 186.0 lb

## 2023-07-06 DIAGNOSIS — Z0000 Encounter for general adult medical examination without abnormal findings: Secondary | ICD-10-CM | POA: Diagnosis not present

## 2023-07-06 NOTE — Progress Notes (Signed)
Subjective:   Julian Pineda is a 77 y.o. male who presents for Medicare Annual/Subsequent preventive examination.  Visit Complete: Virtual  I connected with  Julian Pineda on 07/06/23 by a audio enabled telemedicine application and verified that I am speaking with the correct person using two identifiers.  Patient Location: Home  Provider Location: Home Office  I discussed the limitations of evaluation and management by telemedicine. The patient expressed understanding and agreed to proceed.  Patient Medicare AWV questionnaire was completed by the patient on 07/02/23; I have confirmed that all information answered by patient is correct and no changes since this date.  Vital Signs: Because this visit was a virtual/telehealth visit, some criteria may be missing or patient reported. Any vitals not documented were not able to be obtained and vitals that have been documented are patient reported.    Review of Systems     Cardiac Risk Factors include: advanced age (>55men, >50 women);male gender;hypertension;dyslipidemia     Objective:    Today's Vitals   07/06/23 1504  Weight: 186 lb (84.4 kg)  Height: 5\' 10"  (1.778 m)   Body mass index is 26.69 kg/m.     07/06/2023    3:28 PM 06/28/2022   12:52 PM 05/05/2022    8:30 PM 05/05/2022    8:11 AM 10/19/2021    9:59 AM 09/16/2021    8:23 AM 08/19/2021    9:12 AM  Advanced Directives  Does Patient Have a Medical Advance Directive? Yes Yes Yes No No Yes Yes  Type of Estate agent of [place];Living will Healthcare Power of [place];Living will Healthcare Power of [place];Living will   Healthcare Power of [place];Living will Healthcare Power of [place];Living will  Does patient want to make changes to medical advance directive? No - Patient declined No - Patient declined No - Patient declined      Copy of Healthcare Power of Attorney in Chart? No - copy requested No - copy requested No - copy requested       Would patient like information on creating a medical advance directive?   No - Patient declined No - Patient declined No - Patient declined      Current Medications (verified) Outpatient Encounter Medications as of 07/06/2023  Medication Sig   acetaminophen (TYLENOL) 650 MG CR tablet Take 1,300 mg by mouth every 8 (eight) hours as needed for pain. Before or after playing golf   amLODipine (NORVASC) 5 MG tablet TAKE 1 TABLET BY MOUTH ONCE DAILY   Ascorbic Acid (VITAMIN C) 1000 MG tablet Take 1,000 mg by mouth daily.   calcium carbonate (TUMS) 500 MG chewable tablet 1 tablet Orally Four times a day   cholecalciferol (VITAMIN D) 1000 units tablet Take 1,000 Units by mouth every morning.    ezetimibe (ZETIA) 10 MG tablet Take 1 tablet (10 mg total) by mouth daily. Additional refill available at office visit   folic acid (FOLVITE) 1 MG tablet TAKE 1 TABLET EVERY DAY   hydrOXYzine (ATARAX) 10 MG tablet TAKE ONE TABLET BY MOUTH 3 TIMES DAILY AS NEEDED FOR ANXIETY   losartan (COZAAR) 100 MG tablet TAKE ONE TABLET BY MOUTH EVERY DAY   meloxicam (MOBIC) 15 MG tablet TAKE 1 TABLET BY MOUTH ONCE DAILY WITH FOOD   metroNIDAZOLE (METROGEL) 0.75 % gel Apply 1 Application topically 2 (two) times daily.   Misc Natural Products (IBEROGAST PO) Take 1 tablet by mouth 3 (three) times daily before meals.   pantoprazole (PROTONIX) 40 MG  tablet Take 40 mg by mouth daily.   rosuvastatin (CRESTOR) 10 MG tablet TAKE 1 TABLET BY MOUTH DAILY   sildenafil (VIAGRA) 100 MG tablet Take 100 mg by mouth daily as needed.   UNABLE TO FIND Corner Crack Cream Apply to corners of mouth qid   ondansetron (ZOFRAN ODT) 4 MG disintegrating tablet Take 1 tablet (4 mg total) by mouth every 8 (eight) hours as needed for nausea or vomiting. (Patient not taking: Reported on 07/06/2023)   No facility-administered encounter medications on file as of 07/06/2023.    Allergies (verified) Penicillins   History: Past Medical History:   Diagnosis Date   Arthritis, rheumatoid (HCC)    Diverticulosis of colon    GERD (gastroesophageal reflux disease)    "occas"   History of basal cell carcinoma (BCC) excision    09-25-2014  right lower eyelid s/p moh's dx /  left lower eyelid scheduled removal 07-05-2017   History of squamous cell carcinoma excision    01/ 2015  left lateral cheek  s/p  moh's sx   Hypertension    sees Dr. Danella Maiers, [place]   Nocturia    OA (osteoarthritis)    Prostate cancer [facility]) urologist-  dr eskidge/  oncologist- dr Kathrynn Running   dx 04/ 2018-- Stage T1c, Gleason 3+3,  PSA 7.1,  vol 38.6cc   Rheumatoid arteritis (HCC)    rheumotologist-   dr [place]   Rosacea    Dr. Jarold Motto   Syncope 01/27/2018   Vitamin D deficiency    Past Surgical History:  Procedure Laterality Date   ACHILLES TENDON SURGERY Right 1984   CARPAL TUNNEL RELEASE Bilateral 04/26/2022   CHOLECYSTECTOMY N/A 09/02/2021   Procedure: LAPAROSCOPIC CHOLECYSTECTOMY;  Surgeon: Abigail Miyamoto, MD;  Location: [facility];  Service: General;  Laterality: N/A;   COLONOSCOPY  2008   COLONOSCOPY WITH PROPOFOL N/A 11/09/2017   Procedure: COLONOSCOPY WITH PROPOFOL;  Surgeon: Scot Jun, MD;  Location[facility] ENDOSCOPY;  Service: Endoscopy;  Laterality: N/A;   MOHS SURGERY  09-25-2014;  11-27-2013   [place]   09-25-2014 right lower eyelid /  11-27-2013  left lateral cheek   RADIOACTIVE SEED IMPLANT N/A 06/17/2017   Procedure: RADIOACTIVE SEED IMPLANT/BRACHYTHERAPY IMPLANT;  Surgeon: Jerilee Field, MD;  Location[facility];  Service: Urology;  Laterality: N/A;   TONSILLECTOMY  child   TOTAL HIP ARTHROPLASTY Left 09/29/2021   Procedure: TOTAL HIP ARTHROPLASTY ANTERIOR APPROACH;  Surgeon: Durene Romans, MD;  Location: [facility];  Service: Orthopedics;  Laterality: Left;   TOTAL KNEE ARTHROPLASTY  12/04/2012   Procedure: TOTAL KNEE ARTHROPLASTY;  Surgeon: Dannielle Huh, MD;  Location: [facility] OR;  Service: Orthopedics;   Laterality: Left;  left total knee arthroplasty   VARICOSE VEIN SURGERY  2000   left leg   Family History  Problem Relation Age of Onset   Cancer Mother        Multiple Myeloma   Alcohol abuse Father    Arthritis Father    Prostate cancer Brother    Breast cancer Daughter    Hematuria Neg Hx    Sickle cell anemia Neg Hx    Kidney cancer Neg Hx    Bladder Cancer Neg Hx    Social History   Socioeconomic History   Marital status: Married    Spouse name: Liborio Nixon   Number of children: Not on file   Years of education: Not on file   Highest education level: Bachelor's degree (e.g., BA, AB, BS)  Occupational  History   Occupation: retired    Comment: banking  Tobacco Use   Smoking status: Never   Smokeless tobacco: Former    Types: Chew    Quit date: 06/06/2016  Vaping Use   Vaping status: Never Used  Substance and Sexual Activity   Alcohol use: Yes    Alcohol/week: 5.0 standard drinks of alcohol    Types: 5 Standard drinks or equivalent per week    Comment: 1 vodka tonic 5 days a week   Drug use: No   Sexual activity: Yes  Other Topics Concern   Not on file  Social History Narrative   Moderate alcohol use   Lives in [place] with wife, has a daughter and son   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 Treadmill 5 days a week   Likes to [employer] of [employer]: Low Risk  (07/02/2023)   Overall Financial Resource Strain (CARDIA)    Difficulty of Paying Living Expenses: Not hard at all  Food Insecurity: No Food Insecurity (07/02/2023)   Hunger Vital Sign    Worried About Running Out of Food in the Last Year: Never true    Ran Out of Food in the Last Year: Never true  Transportation Needs: No Transportation Needs (07/02/2023)   PRAPARE - Administrator, Civil Service (Medical): No    Lack of Transportation (Non-Medical): No  Physical Activity: Insufficiently Active (07/02/2023)    Exercise Vital Sign    Days of Exercise per Week: 3 days    Minutes of Exercise per Session: 30 min  Stress: Stress Concern Present (07/02/2023)   [employer] of Occupational Health - Occupational Stress Questionnaire    Feeling of Stress : To some extent  Social Connections: Socially Integrated (07/02/2023)   Social Connection and Isolation Panel [NHANES]    Frequency of Communication with Friends and Family: More than three times a week    Frequency of Social Gatherings with Friends and Family: More than three times a week    Attends Religious Services: More than 4 times per year    Active Member of [employer] or Organizations: Yes    Attends Engineer, structural: More than 4 times per year    Marital Status: Married    Tobacco Counseling Counseling given: Not Answered   Clinical Intake:  Pre-visit preparation completed: Yes  Pain : No/denies pain     BMI - recorded: 26.69 Nutritional Status: BMI 25 -29 Overweight Nutritional Risks: None Diabetes: No  How often do you need to have someone help you when you read instructions, pamphlets, or other written materials from your doctor or pharmacy?: 1 - Never  Interpreter Needed?: No  Information entered by :: Tora Kindred, CMA   Activities of Daily Living    07/02/2023    9:19 AM  In your present state of health, do you have any difficulty performing the following activities:  Hearing? 0  Vision? 0  Difficulty concentrating or making decisions? 0  Walking or climbing stairs? 0  Dressing or bathing? 0  Doing errands, shopping? 0  Preparing Food and eating ? N  Using the Toilet? N  In the past six months, have you accidently leaked urine? N  Do you have problems with loss of bowel control? N  Managing your Medications? N  Managing your Finances? N  Housekeeping or managing your Housekeeping? N  Patient Care Team: Glori Luis, MD as PCP - General (Family Medicine)  Indicate any recent Medical  Services you may have received from other than [facility] providers in the past year (date may be approximate).     Assessment:   This is a routine wellness examination for Sidharth.  Hearing/Vision screen Hearing Screening - Comments:: Denies hearing loss Vision Screening - Comments:: Gets routine eye exams  Dietary issues and exercise activities discussed:     Goals Addressed               This Visit's Progress     Patient Stated (pt-stated)        Stay active and pain free from his back      Depression Screen    07/06/2023    3:25 PM 03/15/2023    9:07 AM 09/10/2022   10:35 AM 06/28/2022   12:45 PM 06/04/2022    2:30 PM 09/07/2021    3:23 PM 09/29/2020    8:47 AM  PHQ 2/9 Scores  PHQ - 2 Score 0 0 0 0 1 0 0  PHQ- 9 Score 0 0         Fall Risk    07/02/2023    9:19 AM 03/15/2023    9:07 AM 09/10/2022   10:34 AM 06/28/2022   12:43 PM 06/04/2022    2:30 PM  Fall Risk   Falls in the past year? 0 0 0 0 0  Number falls in past yr: 0 0 0 0 0  Injury with Fall? 0 0 0 0 0  Risk for fall due to : No Fall Risks No Fall Risks No Fall Risks  No Fall Risks  Follow up Falls prevention discussed Falls evaluation completed Falls evaluation completed Falls evaluation completed Falls evaluation completed    MEDICARE RISK AT HOME: Medicare Risk at Home Any stairs in or around the home?: Yes If so, are there any without handrails?: No Home free of loose throw rugs in walkways, pet beds, electrical cords, etc?: Yes Adequate lighting in your home to reduce risk of falls?: Yes Life alert?: No Use of a cane, walker or w/c?: No Grab bars in the bathroom?: Yes Shower chair or bench in shower?: No Elevated toilet seat or a handicapped toilet?: No  TIMED UP AND GO:  Was the test performed?  No    Cognitive Function:    09/19/2017    8:45 AM  MMSE - Mini Mental State Exam  Orientation to time 5  Orientation to Place 5  Registration 3  Attention/ Calculation 5  Recall 3  Language-  name 2 objects 2  Language- repeat 1  Language- follow 3 step command 3  Language- read & follow direction 1  Write a sentence 1  Copy design 1  Total score 30        07/06/2023    3:30 PM 06/28/2022   12:44 PM 09/25/2019    8:53 AM 09/20/2018    8:48 AM  6CIT Screen  What Year? 0 points 0 points 0 points 0 points  What month? 0 points 0 points 0 points 0 points  What time? 0 points 0 points 0 points 0 points  Count back from 20 0 points 0 points 0 points 0 points  Months in reverse 0 points 0 points 0 points 0 points  Repeat phrase 0 points 0 points 0 points 0 points  Total Score 0 points 0 points 0 points 0 points    Immunizations  Immunization History  Administered Date(s) Administered   DTaP 02/19/2010   PFIZER(Purple Top)SARS-COV-2 Vaccination 12/14/2019, 01/09/2020   Pneumococcal Conjugate-13 09/26/2019   Tdap 08/12/2015    TDAP status: Up to date  Flu Vaccine status: Declined, Education has been provided regarding the importance of this vaccine but patient still declined. Advised may receive this vaccine at local pharmacy or Health Dept. Aware to provide a copy of the vaccination record if obtained from local pharmacy or Health Dept. Verbalized acceptance and understanding.  Pneumococcal vaccine status: Due, Education has been provided regarding the importance of this vaccine. Advised may receive this vaccine at local pharmacy or Health Dept. Aware to provide a copy of the vaccination record if obtained from local pharmacy or Health Dept. Verbalized acceptance and understanding.  Covid-19 vaccine status: Declined, Education has been provided regarding the importance of this vaccine but patient still declined. Advised may receive this vaccine at local pharmacy or Health Dept.or vaccine clinic. Aware to provide a copy of the vaccination record if obtained from local pharmacy or Health Dept. Verbalized acceptance and understanding.  Qualifies for Shingles Vaccine? Yes    Zostavax completed No   Shingrix Completed?: No.    Education has been provided regarding the importance of this vaccine. Patient has been advised to call insurance company to determine out of pocket expense if they have not yet received this vaccine. Advised may also receive vaccine at local pharmacy or Health Dept. Verbalized acceptance and understanding.  Screening Tests Health Maintenance  Topic Date Due   Zoster Vaccines- Shingrix (1 of 2) Never done   COVID-19 Vaccine (3 - Pfizer risk series) 02/06/2020   Pneumonia Vaccine 23+ Years old (2 of 2 - PPSV23 or PCV20) 09/25/2020   INFLUENZA VACCINE  Never done   Medicare Annual Wellness (AWV)  07/05/2024   Colonoscopy  07/21/2024   DTaP/Tdap/Td (3 - Td or Tdap) 08/11/2025   Hepatitis C Screening  Completed   HPV VACCINES  Aged Out    Health Maintenance  Health Maintenance Due  Topic Date Due   Zoster Vaccines- Shingrix (1 of 2) Never done   COVID-19 Vaccine (3 - Pfizer risk series) 02/06/2020   Pneumonia Vaccine 54+ Years old (2 of 2 - PPSV23 or PCV20) 09/25/2020   INFLUENZA VACCINE  Never done    Colorectal cancer screening: Type of screening: Colonoscopy. Completed 07/21/24. Repeat every 3 years  Lung Cancer Screening: (Low Dose CT Chest recommended if Age 22-80 years, 20 pack-year currently smoking OR have quit w/in 15years.) does not qualify.   Lung Cancer Screening Referral: n/a  Additional Screening:  Hepatitis C Screening: does not qualify; Completed 05/05/22  Vision Screening: Recommended annual ophthalmology exams for early detection of glaucoma and other disorders of the eye.  Dental Screening: Recommended annual dental exams for proper oral hygiene   Community Resource Referral / Chronic Care Management: CRR required this visit?  No   CCM required this visit?  No     Plan:     I have personally reviewed and noted the following in the patient's chart:   Medical and social history Use of alcohol,  tobacco or illicit drugs  Current medications and supplements including opioid prescriptions. Patient is not currently taking opioid prescriptions. Functional ability and status Nutritional status Physical activity Advanced directives List of other physicians Hospitalizations, surgeries, and ER visits in previous 12 months Vitals Screenings to include cognitive, depression, and falls Referrals and appointments  In addition, I have reviewed and discussed with patient  certain preventive protocols, quality metrics, and best practice recommendations. A written personalized care plan for preventive services as well as general preventive health recommendations were provided to patient.     Tora Kindred, CMA   07/06/2023   After Visit Summary: (MyChart) Due to this being a telephonic visit, the after visit summary with patients personalized plan was offered to patient via MyChart   Nurse Notes:  Declined shingles, covid and flu vaccines. Needs a pneumonia shot

## 2023-07-06 NOTE — Patient Instructions (Addendum)
Mr. Zani , Thank you for taking time to come for your Medicare Wellness Visit. I appreciate your ongoing commitment to your health goals. Please review the following plan we discussed and let me know if I can assist you in the future.   Referrals/Orders/Follow-Ups/Clinician Recommendations: Recommend getting another pneumonia shot at your earliest convenience.  This is a list of the screening recommended for you and due dates:  Health Maintenance  Topic Date Due   Zoster (Shingles) Vaccine (1 of 2) Never done   COVID-19 Vaccine (3 - Pfizer risk series) 02/06/2020   Pneumonia Vaccine (2 of 2 - PPSV23 or PCV20) 09/25/2020   Flu Shot  Never done   Medicare Annual Wellness Visit  07/05/2024   Colon Cancer Screening  07/21/2024   DTaP/Tdap/Td vaccine (3 - Td or Tdap) 08/11/2025   Hepatitis C Screening  Completed   HPV Vaccine  Aged Out    Advanced directives: (Copy Requested) Please bring a copy of your health care power of attorney and living will to the office to be added to your chart at your convenience.  Next Medicare Annual Wellness Visit scheduled for next year: Yes, 07/11/24 @ 3pm

## 2023-07-14 DIAGNOSIS — Z85828 Personal history of other malignant neoplasm of skin: Secondary | ICD-10-CM | POA: Diagnosis not present

## 2023-07-14 DIAGNOSIS — D2262 Melanocytic nevi of left upper limb, including shoulder: Secondary | ICD-10-CM | POA: Diagnosis not present

## 2023-07-14 DIAGNOSIS — X32XXXA Exposure to sunlight, initial encounter: Secondary | ICD-10-CM | POA: Diagnosis not present

## 2023-07-14 DIAGNOSIS — D2271 Melanocytic nevi of right lower limb, including hip: Secondary | ICD-10-CM | POA: Diagnosis not present

## 2023-07-14 DIAGNOSIS — D2272 Melanocytic nevi of left lower limb, including hip: Secondary | ICD-10-CM | POA: Diagnosis not present

## 2023-07-14 DIAGNOSIS — Z872 Personal history of diseases of the skin and subcutaneous tissue: Secondary | ICD-10-CM | POA: Diagnosis not present

## 2023-07-14 DIAGNOSIS — L570 Actinic keratosis: Secondary | ICD-10-CM | POA: Diagnosis not present

## 2023-07-14 DIAGNOSIS — D225 Melanocytic nevi of trunk: Secondary | ICD-10-CM | POA: Diagnosis not present

## 2023-07-14 DIAGNOSIS — D0461 Carcinoma in situ of skin of right upper limb, including shoulder: Secondary | ICD-10-CM | POA: Diagnosis not present

## 2023-07-14 DIAGNOSIS — D485 Neoplasm of uncertain behavior of skin: Secondary | ICD-10-CM | POA: Diagnosis not present

## 2023-07-14 DIAGNOSIS — D2261 Melanocytic nevi of right upper limb, including shoulder: Secondary | ICD-10-CM | POA: Diagnosis not present

## 2023-07-19 DIAGNOSIS — D0461 Carcinoma in situ of skin of right upper limb, including shoulder: Secondary | ICD-10-CM | POA: Diagnosis not present

## 2023-07-26 DIAGNOSIS — M9905 Segmental and somatic dysfunction of pelvic region: Secondary | ICD-10-CM | POA: Diagnosis not present

## 2023-07-26 DIAGNOSIS — M5136 Other intervertebral disc degeneration, lumbar region: Secondary | ICD-10-CM | POA: Diagnosis not present

## 2023-07-26 DIAGNOSIS — M62830 Muscle spasm of back: Secondary | ICD-10-CM | POA: Diagnosis not present

## 2023-07-26 DIAGNOSIS — M9903 Segmental and somatic dysfunction of lumbar region: Secondary | ICD-10-CM | POA: Diagnosis not present

## 2023-07-30 IMAGING — DX DG PORTABLE PELVIS
1 series · 1 of 1 positions shown · non-contrast
Comparison: Left hip x-ray 09/29/2021.

CLINICAL DATA: Postop left hip replacement.

EXAM:
PORTABLE PELVIS 1-2 VIEWS

[pelvis ap]
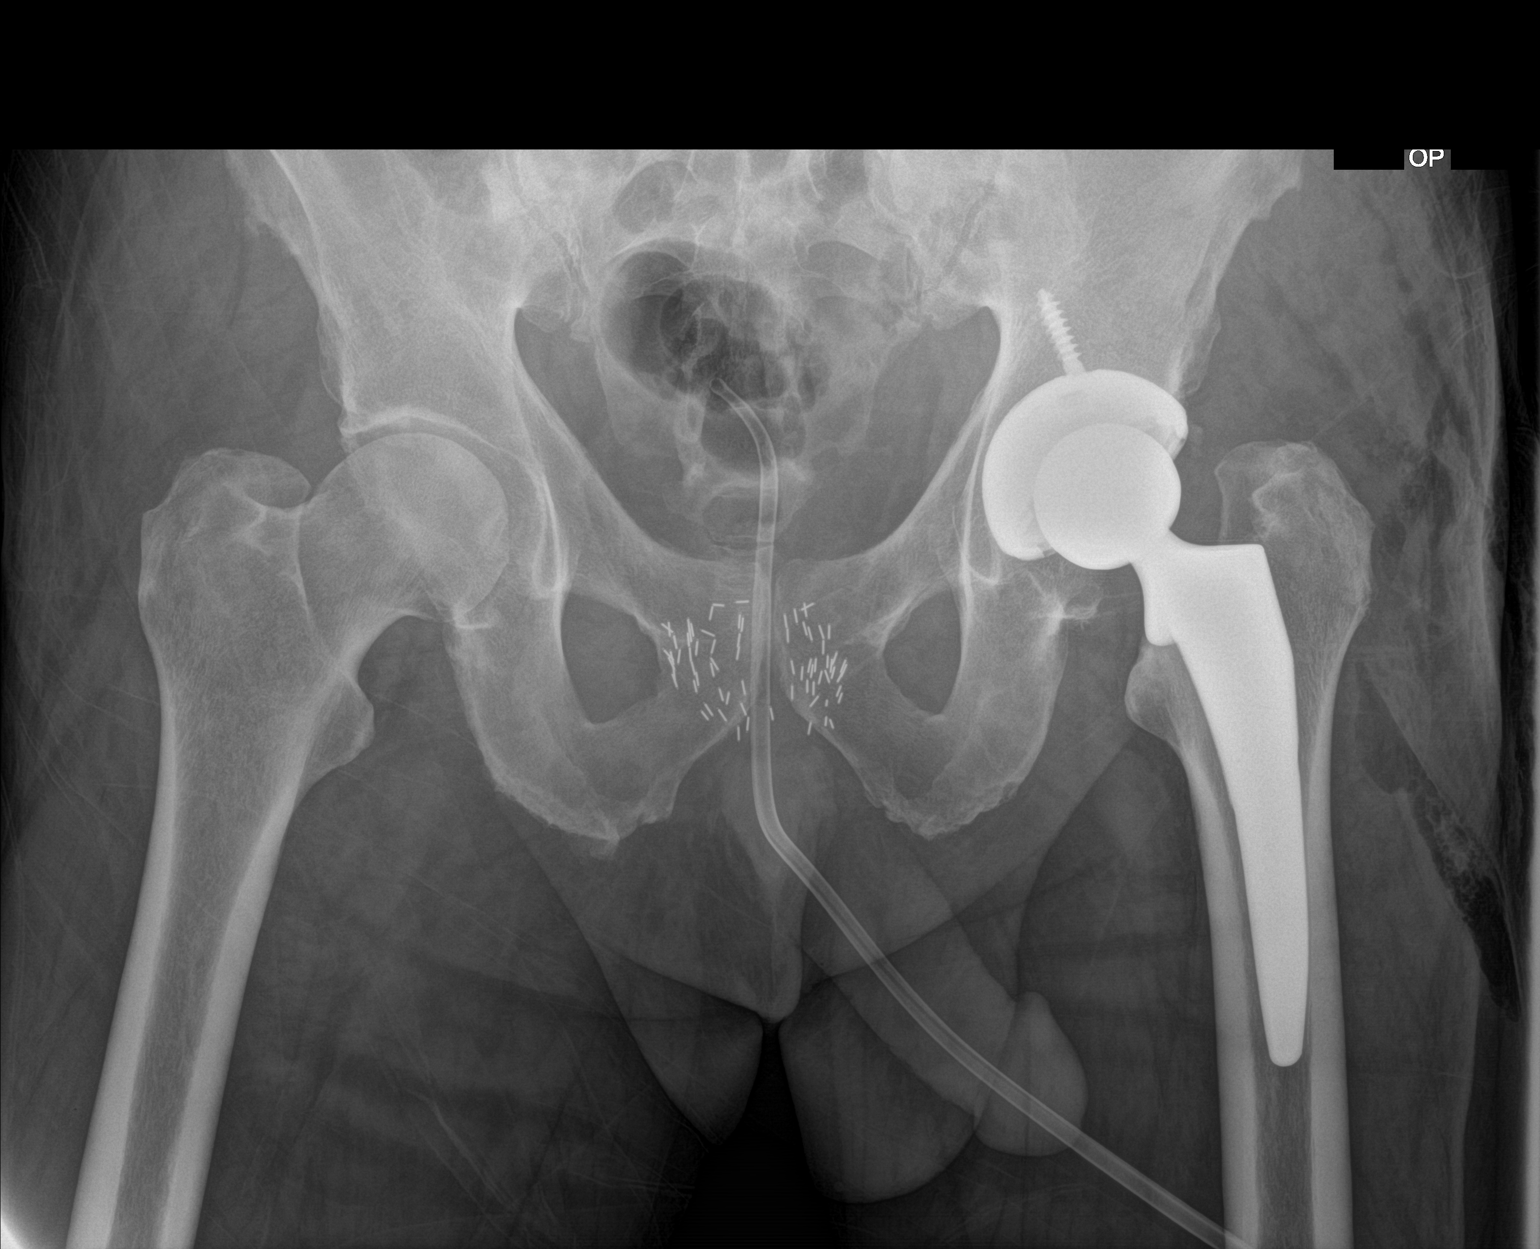

[1 of 1 positions shown; findings below may reference images not displayed]

FINDINGS: There is a new left hip arthroplasty in anatomic alignment. There is
no evidence for hardware loosening or fracture. There is lateral
left hip soft tissue swelling and air compatible with recent
surgery.

Foley catheter is present.  Prostate radiotherapy seeds are present.
IMPRESSION: 1. New left hip arthroplasty in anatomic alignment.

## 2023-07-30 IMAGING — RF DG HIP (WITH OR WITHOUT PELVIS) 2-3V*L*
1 series · 11 of 11 positions shown · non-contrast
Comparison: None.

CLINICAL DATA: Left total hip.

EXAM:
DG HIP (WITH OR WITHOUT PELVIS) 2-3V LEFT

[Series 1: unknown protocol · 11 of 11 slices shown]
[im 1/11]
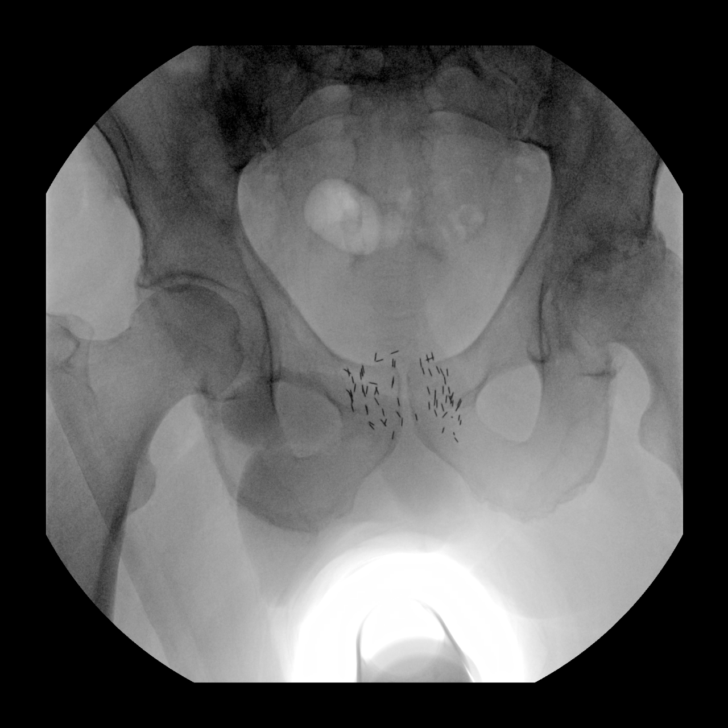
[im 2/11]
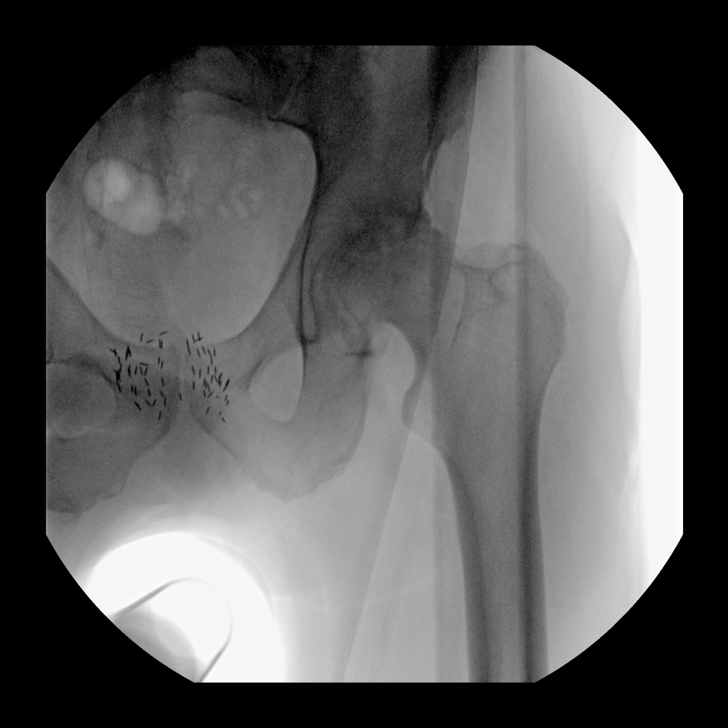
[im 3/11]
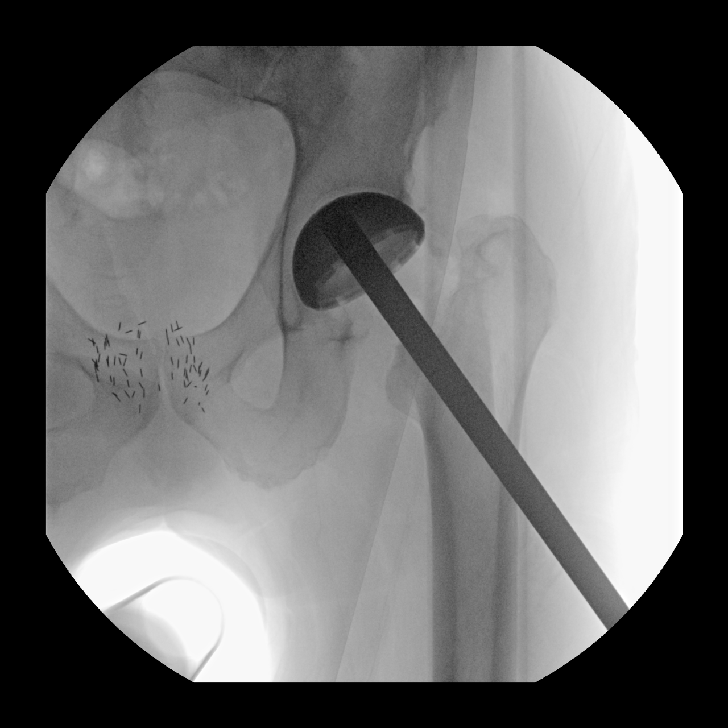
[im 4/11]
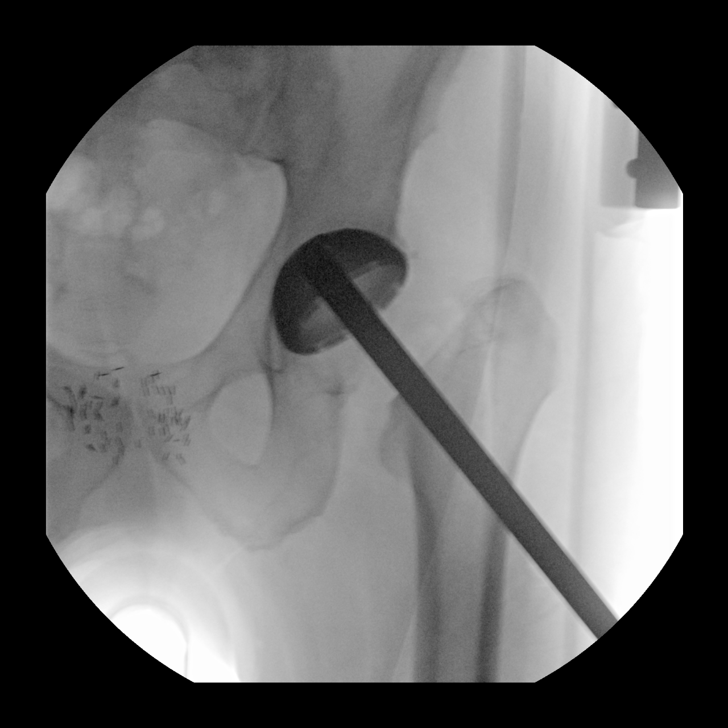
[im 5/11]
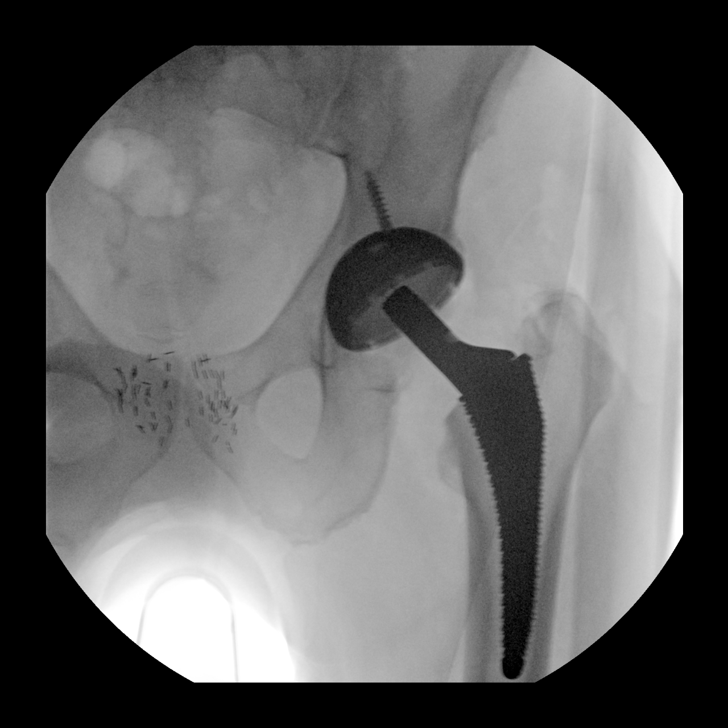
[im 6/11]
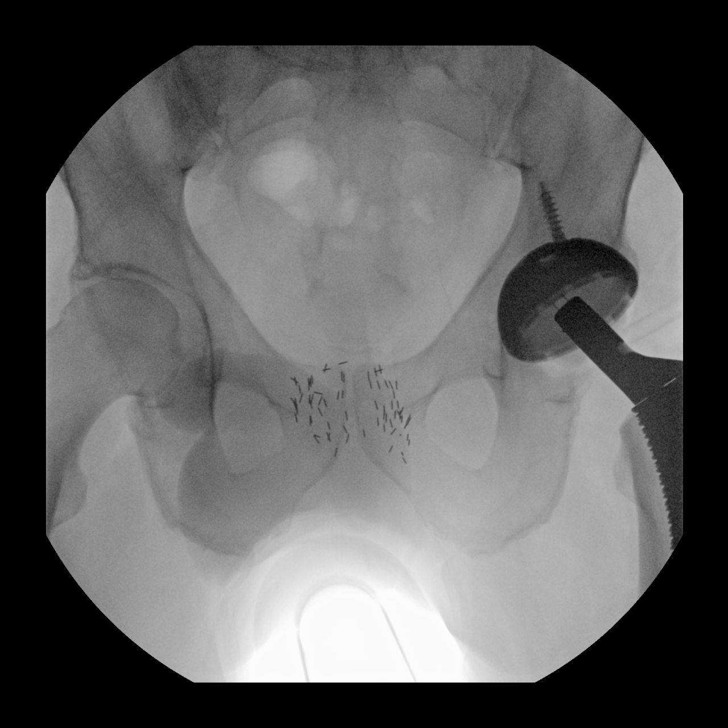
[im 7/11]
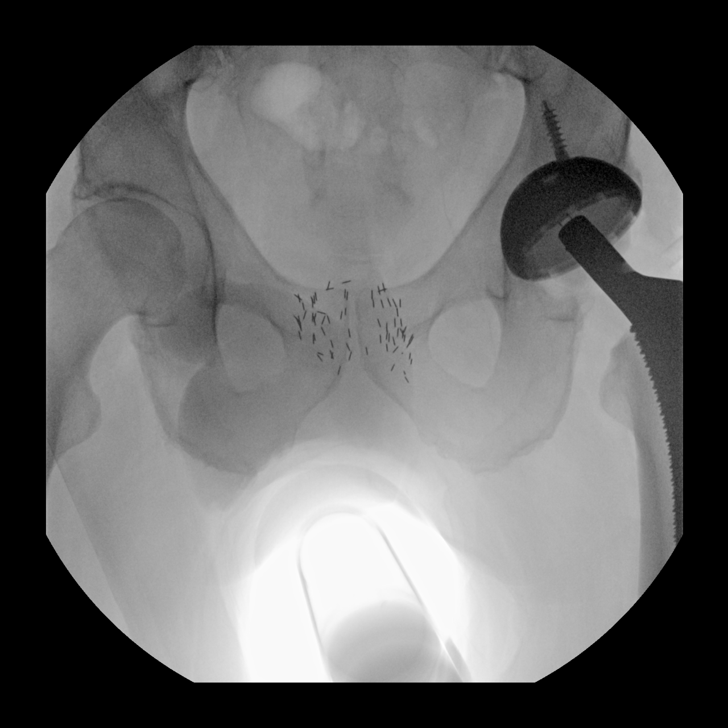
[im 8/11]
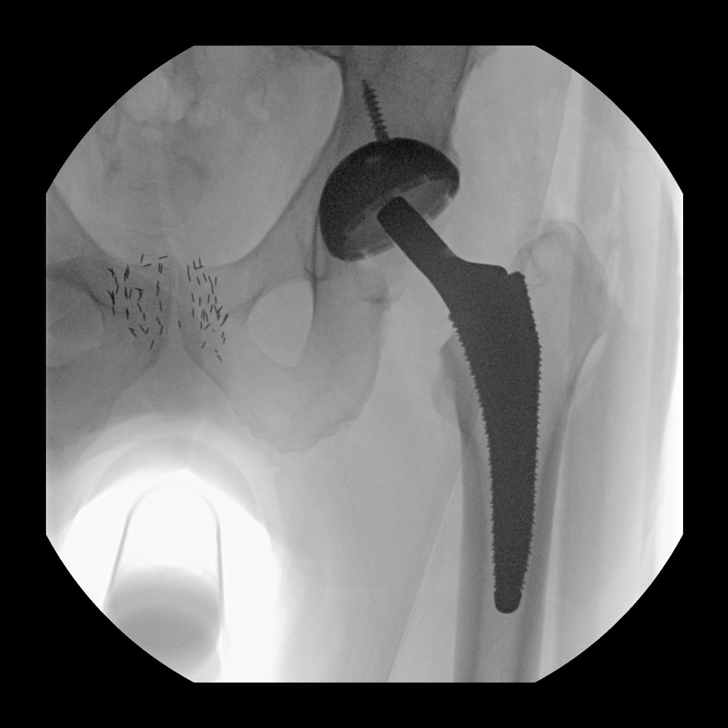
[im 9/11]
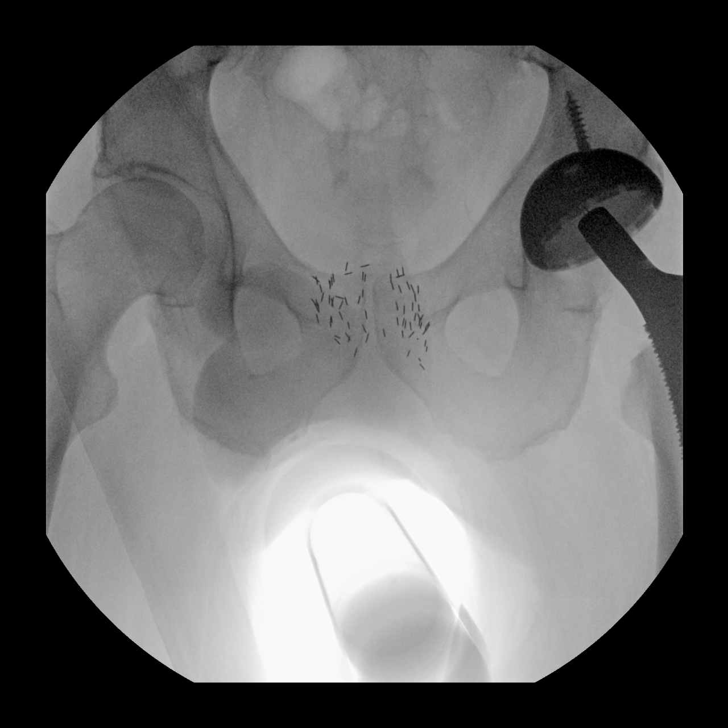
[im 10/11]
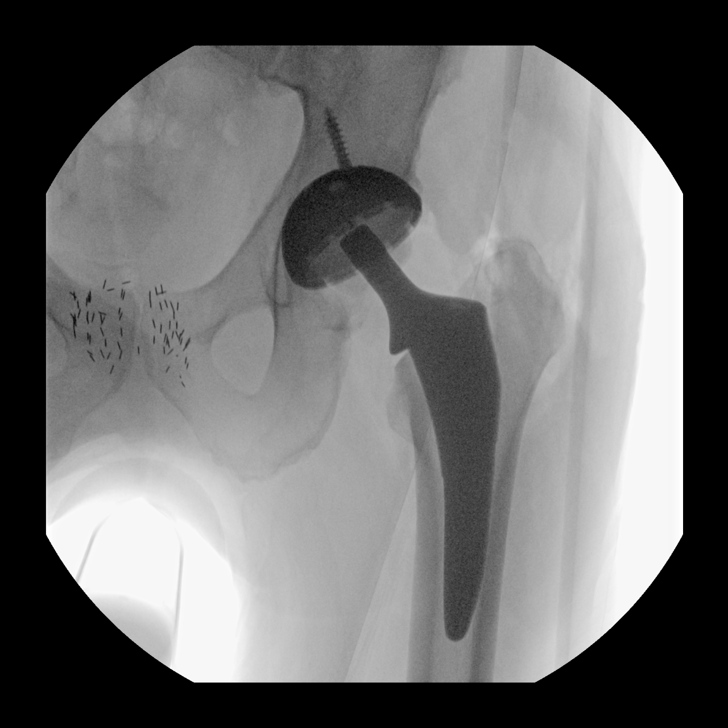
[im 11/11]
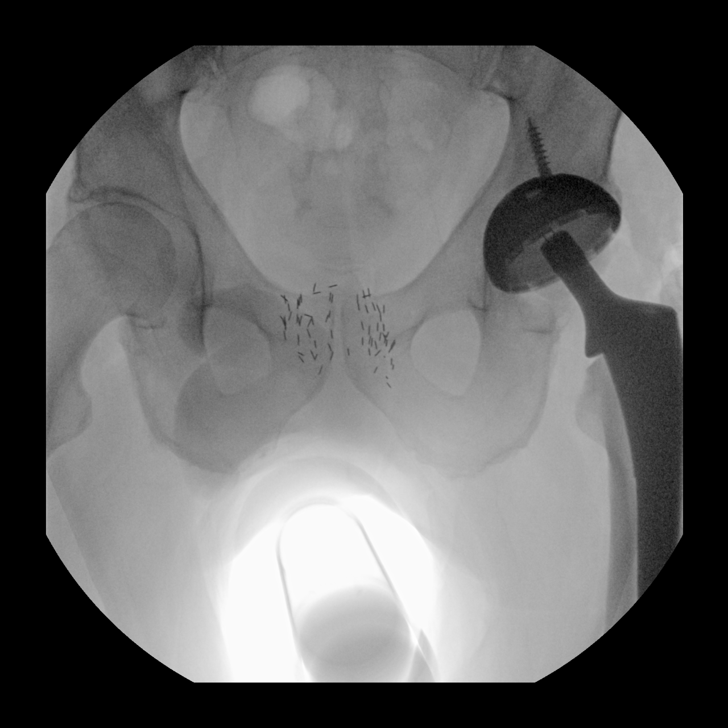

[11 of 11 positions shown; findings below may reference images not displayed]

FINDINGS: Intraoperative left hip arthroplasty.

Eleven low resolution intraoperative spot views of the left hip were
obtained. No left hip arthroplasty was placed. Prostate radiotherapy
seeds are present.

Total fluoroscopy time: 15 seconds

Total radiation dose: Not recorded
IMPRESSION: Intraoperative left hip.  Lymph

## 2023-08-11 DIAGNOSIS — M0589 Other rheumatoid arthritis with rheumatoid factor of multiple sites: Secondary | ICD-10-CM | POA: Diagnosis not present

## 2023-08-19 ENCOUNTER — Other Ambulatory Visit: Payer: Self-pay | Admitting: Family Medicine

## 2023-08-19 DIAGNOSIS — E785 Hyperlipidemia, unspecified: Secondary | ICD-10-CM

## 2023-09-19 ENCOUNTER — Ambulatory Visit (INDEPENDENT_AMBULATORY_CARE_PROVIDER_SITE_OTHER): Payer: PPO | Admitting: Family Medicine

## 2023-09-19 ENCOUNTER — Encounter: Payer: Self-pay | Admitting: Family Medicine

## 2023-09-19 VITALS — BP 118/74 | HR 90 | Temp 98.3°F | Ht 70.0 in | Wt 192.6 lb

## 2023-09-19 DIAGNOSIS — E559 Vitamin D deficiency, unspecified: Secondary | ICD-10-CM | POA: Diagnosis not present

## 2023-09-19 DIAGNOSIS — E785 Hyperlipidemia, unspecified: Secondary | ICD-10-CM

## 2023-09-19 DIAGNOSIS — Z1329 Encounter for screening for other suspected endocrine disorder: Secondary | ICD-10-CM | POA: Diagnosis not present

## 2023-09-19 DIAGNOSIS — Z130 Encounter for screening for diseases of the blood and blood-forming organs and certain disorders involving the immune mechanism: Secondary | ICD-10-CM

## 2023-09-19 DIAGNOSIS — E663 Overweight: Secondary | ICD-10-CM | POA: Diagnosis not present

## 2023-09-19 DIAGNOSIS — J309 Allergic rhinitis, unspecified: Secondary | ICD-10-CM

## 2023-09-19 DIAGNOSIS — Z0000 Encounter for general adult medical examination without abnormal findings: Secondary | ICD-10-CM

## 2023-09-19 LAB — TSH: TSH: 3.19 u[IU]/mL (ref 0.35–5.50)

## 2023-09-19 LAB — LIPID PANEL
Cholesterol: 139 mg/dL (ref 0–200)
HDL: 63.9 mg/dL (ref >39.00)
LDL Cholesterol: 51 mg/dL (ref 0–99)
NonHDL: 74.75
Total CHOL/HDL Ratio: 2
Triglycerides: 118 mg/dL (ref 0.0–149.0)
VLDL: 23.6 mg/dL (ref 0.0–40.0)

## 2023-09-19 LAB — COMPREHENSIVE METABOLIC PANEL
ALT: 27 U/L (ref 0–53)
AST: 21 U/L (ref 0–37)
Albumin: 4.4 g/dL (ref 3.5–5.2)
Alkaline Phosphatase: 45 U/L (ref 39–117)
BUN: 31 mg/dL — ABNORMAL HIGH (ref 6–23)
CO2: 28 meq/L (ref 19–32)
Calcium: 9.8 mg/dL (ref 8.4–10.5)
Chloride: 101 meq/L (ref 96–112)
Creatinine, Ser: 1.15 mg/dL (ref 0.40–1.50)
GFR: 62.18 mL/min (ref >60.00)
Glucose, Bld: 113 mg/dL — ABNORMAL HIGH (ref 70–99)
Potassium: 4.2 meq/L (ref 3.5–5.1)
Sodium: 139 meq/L (ref 135–145)
Total Bilirubin: 0.7 mg/dL (ref 0.2–1.2)
Total Protein: 6.4 g/dL (ref 6.0–8.3)

## 2023-09-19 LAB — CBC
HCT: 43.2 % (ref 39.0–52.0)
Hemoglobin: 14.2 g/dL (ref 13.0–17.0)
MCHC: 32.9 g/dL (ref 30.0–36.0)
MCV: 94.5 fL (ref 78.0–100.0)
Platelets: 249 10*3/uL (ref 150.0–400.0)
RBC: 4.57 Mil/uL (ref 4.22–5.81)
RDW: 13.1 % (ref 11.5–15.5)
WBC: 6.5 10*3/uL (ref 4.0–10.5)

## 2023-09-19 LAB — HEMOGLOBIN A1C: Hgb A1c MFr Bld: 5.9 % (ref 4.6–6.5)

## 2023-09-19 LAB — VITAMIN D 25 HYDROXY (VIT D DEFICIENCY, FRACTURES): VITD: 31.75 ng/mL (ref 30.00–100.00)

## 2023-09-19 NOTE — Assessment & Plan Note (Signed)
Physical exam completed.  Encouraged healthy diet and exercise.  He will continue to follow with urology.  Discussed he does not need any further colon cancer screening.  He declines all vaccines today.  He understands the purpose of the vaccines.  I advised that they are available if he changes his mind.  He will continue to monitor his alcohol intake.  Lab work as outlined.

## 2023-09-19 NOTE — Assessment & Plan Note (Signed)
Suspect allergic rhinitis.  Patient will trial Claritin or Zyrtec over-the-counter.  If that is not beneficial he can try Flonase over-the-counter.  If that does not help he will let us know.

## 2023-09-19 NOTE — Progress Notes (Signed)
Julian Alar, MD Phone: [phone]  Julian Pineda is a 77 y.o. male who presents today for CPE.  Diet: Lots of eggs and salads, mostly lean meats, red meat once weekly, has some sweet tea each day though not a large volume, no sweets or junk food, some fruits some vegetables Exercise: Lifts weights 2 times a week and golfs 3 times a week Colonoscopy: Up-to-date, no further colonoscopies recommended by GI Prostate cancer screening: Follows with urology Family history-  Prostate cancer: Brother  Colon cancer: No Vaccines-   Flu: declines  Tetanus: UTD  Shingles: declines  COVID19: declines  Pneumonia: declines  RSV: declines Hep C Screening: UTD Tobacco use: former chewing tobacco Alcohol use: 4/week, much decreased from previously Illicit Drug use: no Dentist: yes Ophthalmology: yes Patient notes some congestion mostly when he lays down for the last several weeks.  Has had some sneezing.  Is blowing dry up mucus out of his nose.   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/21/2021   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   Syncope 05/05/2022   HLD (hyperlipidemia) 05/05/2022   AKI (acute kidney injury) (HCC) 05/05/2022   Abnormal LFTs 05/05/2022   Anxiety 06/04/2022   Change in bowel function 03/15/2023   Allergic rhinitis 09/19/2023   Resolved Ambulatory Problems    Diagnosis Date Noted    Osteoarthritis of left knee 08/18/2012   Erectile dysfunction 12/11/2013   Stye 02/08/2017   Syncope 01/27/2018   Anemia 01/27/2018   ARF (acute renal failure) (HCC) 01/28/2018   Elevated LDL cholesterol level 01/14/2020   Positive D-dimer 05/05/2022   Past Medical History:  Diagnosis Date   Arthritis, rheumatoid (HCC)    Diverticulosis of colon    GERD (gastroesophageal reflux disease)    History of basal cell carcinoma (BCC) excision    History of squamous cell carcinoma excision    Nocturia    OA (osteoarthritis)    Prostate cancer [facility]) urologist-  dr eskidge/  oncologist- dr Kathrynn Running   Rheumatoid arteritis (HCC)    Rosacea    Vitamin D deficiency     Family History  Problem Relation Age of Onset   Cancer Mother        Multiple Myeloma   Alcohol abuse Father    Arthritis Father    Prostate cancer Brother    Breast cancer Daughter    Hematuria Neg Hx    Sickle cell anemia Neg Hx    Kidney cancer Neg Hx    Bladder Cancer Neg Hx     Social History   Socioeconomic History   Marital status: Married    Spouse name: Julian Pineda   Number of children: Not on file   Years of education: Not on file   Highest education level: Bachelor's degree (e.g., BA, AB, BS)  Occupational History   Occupation: retired    Comment: banking  Tobacco Use   Smoking status: Never  Smokeless tobacco: Former    Types: Chew    Quit date: 06/06/2016  Vaping Use   Vaping status: Never Used  Substance and Sexual Activity   Alcohol use: Yes    Alcohol/week: 5.0 standard drinks of alcohol    Types: 5 Standard drinks or equivalent per week    Comment: 1 vodka tonic 5 days a week   Drug use: No   Sexual activity: Yes  Other Topics Concern   Not on file  Social History Narrative   Moderate alcohol use   Lives in [place] with wife, has a daughter and son   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 Treadmill 5 days a week   Likes to [employer] of [employer]: Low Risk  (07/02/2023)   Overall Financial Resource Strain (CARDIA)    Difficulty of Paying Living Expenses: Not hard at all  Food Insecurity: No Food Insecurity (07/02/2023)   Hunger Vital Sign    Worried About Running Out of Food in the Last Year: Never true    Ran Out of Food in the Last Year: Never true  Transportation Needs: No Transportation Needs (07/02/2023)   PRAPARE - Administrator, Civil Service (Medical): No    Lack of Transportation (Non-Medical): No  Physical Activity: Insufficiently Active (07/02/2023)   Exercise Vital Sign    Days of Exercise per Week: 3 days    Minutes of Exercise per Session: 30 min  Stress: Stress Concern Present (07/02/2023)   [employer] of Occupational Health - Occupational Stress Questionnaire    Feeling of Stress : To some extent  Social Connections: Socially Integrated (07/02/2023)   Social Connection and Isolation Panel [NHANES]    Frequency of Communication with Friends and Family: More than three times a week    Frequency of Social Gatherings with Friends and Family: More than three times a week    Attends Religious Services: More than 4 times per year    Active Member of [employer] or Organizations: Yes    Attends Engineer, structural: More than 4 times per year    Marital Status: Married  Catering manager Violence: Not At Risk (07/06/2023)   Humiliation, Afraid, Rape, and Kick questionnaire    Fear of Current or Ex-Partner: No    Emotionally Abused: No    Physically Abused: No    Sexually Abused: No    ROS  General:  Negative for nexplained weight loss, fever Skin: Negative for new or changing mole, sore that won't heal HEENT: Negative for trouble hearing, trouble seeing, ringing in ears, mouth sores, hoarseness, change in voice, dysphagia. CV:  Negative for chest pain, dyspnea, edema, palpitations Resp: Negative for cough, dyspnea, hemoptysis GI: Negative for  nausea, vomiting, diarrhea, constipation, abdominal pain, melena, hematochezia. GU: Negative for dysuria, incontinence, urinary hesitance, hematuria, vaginal or penile discharge, polyuria, sexual difficulty, lumps in testicle or breasts MSK: Negative for muscle cramps or aches, joint pain or swelling Neuro: Negative for headaches, weakness, numbness, dizziness, passing out/fainting Psych: Negative for depression, anxiety, memory problems  Objective  Physical Exam Vitals:   09/19/23 1032  BP: 118/74  Pulse: 90  Temp: 98.3 F (36.8 C)  SpO2: 94%    BP Readings from Last 3 Encounters:  09/19/23 118/74  03/15/23 118/76  01/17/23 120/70   Wt Readings from Last 3 Encounters:  09/19/23 192 lb 9.6 oz (87.4 kg)  07/06/23 186 lb (84.4 kg)  03/15/23 195 lb 9.6 oz (88.7 kg)    Physical Exam Constitutional:      General: He is not in acute distress.    Appearance: He is not diaphoretic.  HENT:     Head: Normocephalic and atraumatic.  Cardiovascular:     Rate and Rhythm: Normal rate and regular rhythm.     Heart sounds: Normal heart sounds.  Pulmonary:     Effort: Pulmonary effort is normal.     Breath sounds: Normal breath sounds.  Abdominal:     General: Bowel sounds are normal. There is no distension.     Palpations: Abdomen is soft.     Tenderness: There is no abdominal tenderness.  Musculoskeletal:     Right lower leg: No edema.     Left lower leg: No edema.  Lymphadenopathy:     Cervical: No cervical adenopathy.  Skin:    General: Skin is warm and dry.  Neurological:     Mental Status: He is alert.  Psychiatric:        Mood and Affect: Mood normal.      Assessment/Plan:   Routine general medical examination at a health care facility Assessment & Plan: Physical exam completed.  Encouraged healthy diet and exercise.  He will continue to follow with urology.  Discussed he does not need any further colon cancer screening.  He declines all vaccines today.  He  understands the purpose of the vaccines.  I advised that they are available if he changes his mind.  He will continue to monitor his alcohol intake.  Lab work as outlined.   Allergic rhinitis, unspecified seasonality, unspecified trigger Assessment & Plan: Suspect allergic rhinitis.  Patient will trial Claritin or Zyrtec over-the-counter.  If that is not beneficial he can try Flonase over-the-counter.  If that does not help he will let us know.   Thyroid disorder screen -     TSH  Screening for deficiency anemia -     CBC  Hyperlipidemia, unspecified hyperlipidemia type -     Comprehensive metabolic panel -     Lipid panel  Vitamin D deficiency disease -     VITAMIN D 25 Hydroxy (Vit-D Deficiency, Fractures)  Overweight -     Hemoglobin A1c    Return in about 6 months (around 03/18/2024) for transfer of care.   Julian Alar, MD [facility]

## 2023-10-20 ENCOUNTER — Other Ambulatory Visit: Payer: Self-pay | Admitting: Family Medicine

## 2023-10-20 DIAGNOSIS — I10 Essential (primary) hypertension: Secondary | ICD-10-CM

## 2023-11-02 ENCOUNTER — Other Ambulatory Visit: Payer: Self-pay | Admitting: Family Medicine

## 2023-11-10 DIAGNOSIS — Z6827 Body mass index (BMI) 27.0-27.9, adult: Secondary | ICD-10-CM | POA: Diagnosis not present

## 2023-11-10 DIAGNOSIS — E663 Overweight: Secondary | ICD-10-CM | POA: Diagnosis not present

## 2023-11-10 DIAGNOSIS — Z79899 Other long term (current) drug therapy: Secondary | ICD-10-CM | POA: Diagnosis not present

## 2023-11-10 DIAGNOSIS — M19049 Primary osteoarthritis, unspecified hand: Secondary | ICD-10-CM | POA: Diagnosis not present

## 2023-11-10 DIAGNOSIS — R5382 Chronic fatigue, unspecified: Secondary | ICD-10-CM | POA: Diagnosis not present

## 2023-11-10 DIAGNOSIS — M1991 Primary osteoarthritis, unspecified site: Secondary | ICD-10-CM | POA: Diagnosis not present

## 2023-11-10 DIAGNOSIS — M0589 Other rheumatoid arthritis with rheumatoid factor of multiple sites: Secondary | ICD-10-CM | POA: Diagnosis not present

## 2023-11-16 DIAGNOSIS — J310 Chronic rhinitis: Secondary | ICD-10-CM | POA: Diagnosis not present

## 2023-11-16 DIAGNOSIS — J301 Allergic rhinitis due to pollen: Secondary | ICD-10-CM | POA: Diagnosis not present

## 2023-11-16 DIAGNOSIS — R0981 Nasal congestion: Secondary | ICD-10-CM | POA: Diagnosis not present

## 2023-11-16 DIAGNOSIS — J309 Allergic rhinitis, unspecified: Secondary | ICD-10-CM | POA: Diagnosis not present

## 2023-12-07 DIAGNOSIS — R0981 Nasal congestion: Secondary | ICD-10-CM | POA: Diagnosis not present

## 2024-01-17 ENCOUNTER — Telehealth: Payer: Self-pay | Admitting: Family Medicine

## 2024-01-17 NOTE — Telephone Encounter (Signed)
 Left message to call and schedule a TOC with Dr Darrick Huntsman. Ok'd by Darrick Huntsman.

## 2024-02-15 ENCOUNTER — Ambulatory Visit (INDEPENDENT_AMBULATORY_CARE_PROVIDER_SITE_OTHER): Admitting: Internal Medicine

## 2024-02-15 ENCOUNTER — Encounter: Payer: Self-pay | Admitting: Internal Medicine

## 2024-02-15 VITALS — BP 124/60 | HR 64 | Ht 70.0 in | Wt 195.2 lb

## 2024-02-15 DIAGNOSIS — R110 Nausea: Secondary | ICD-10-CM

## 2024-02-15 DIAGNOSIS — I10 Essential (primary) hypertension: Secondary | ICD-10-CM

## 2024-02-15 DIAGNOSIS — E785 Hyperlipidemia, unspecified: Secondary | ICD-10-CM | POA: Diagnosis not present

## 2024-02-15 DIAGNOSIS — F1011 Alcohol abuse, in remission: Secondary | ICD-10-CM

## 2024-02-15 DIAGNOSIS — R944 Abnormal results of kidney function studies: Secondary | ICD-10-CM

## 2024-02-15 MED ORDER — ROSUVASTATIN CALCIUM 10 MG PO TABS: 10.0000 mg | ORAL_TABLET | Freq: Every day | ORAL | 1 refills | Status: DC

## 2024-02-15 MED ORDER — LOSARTAN POTASSIUM 100 MG PO TABS: 100.0000 mg | ORAL_TABLET | Freq: Every day | ORAL | 1 refills | Status: DC

## 2024-02-15 NOTE — Progress Notes (Unsigned)
 Subjective:  Patient ID: Julian Pineda, male    DOB: Aug 26, 1946  Age: 78 y.o. MRN: [id number]  CC: The primary encounter diagnosis was Primary hypertension. Diagnoses of Hyperlipidemia, unspecified hyperlipidemia type, Decreased calculated glomerular filtration rate (GFR), Nausea, and History of alcohol abuse were also pertinent to this visit.   Post prandial nausea  and anxiety both , resolved transiently after cholecystectomy approx 1.5 years ago  Takes OTC iberogast: which helps,  along with pantoprazole  prescribed by GI   Has reduced alcohol intake  and notes that abstinence  from alcohol makes symptoms worse.  Alcohol use increased to  4-5 drinks daily during COVID ,  mostly vodka tonics.  His alcohol abuse resulted in ER visit for dehydration, so reduced his alcohol consumption after the ER visit .  Has been taking  hydroxyzine prn for anxiety .  Does not want SSRI trial  Sleeping except  for nocturia x 2 .  H/o prostate CA found during colonoscopy in 2019 by Mechele Collin.  PSA  was 7  .  Found  5  years ago   treated with radioactive seeds.  PSAs have dropped.  Sees [facility] in [place] semi annually.   Colon CA screening: completed.  No FH.     Hypertension: patient checks blood pressure  once or twice per month  at home.  Readings have been for the most part <130/80 at rest . Patient is following a reduced salt diet most days and is taking medications as prescribed     [O]HPI Julian Pineda presents for establishment of care   History Wright has a past medical history of Arthritis, rheumatoid (HCC), Diverticulosis of colon, GERD (gastroesophageal reflux disease), History of basal cell carcinoma (BCC) excision, History of squamous cell carcinoma excision, Hypertension, Nocturia, OA (osteoarthritis), Prostate cancer [facility]) (urologist-  dr eskidge/  oncologist- dr Kathrynn Running), Rheumatoid arteritis [facility]), Rosacea, Syncope (01/27/2018), Syncope (05/05/2022), and Vitamin D deficiency.   He has a  past surgical history that includes Achilles tendon surgery (Right, 1984); Total knee arthroplasty (12/04/2012); Colonoscopy (2008); Mohs surgery (09-25-2014;  11-27-2013   [place]); Varicose vein surgery (2000); Tonsillectomy (child); Radioactive seed implant (N/A, 06/17/2017); Colonoscopy with propofol (N/A, 11/09/2017); Cholecystectomy (N/A, 09/02/2021); Total hip arthroplasty (Left, 09/29/2021); and Carpal tunnel release (Bilateral, 04/26/2022).   His family history includes Alcohol abuse in his father; Arthritis in his father; Breast cancer in his daughter; Cancer in his mother; Prostate cancer in his brother.He reports that he has never smoked. He quit smokeless tobacco use about 7 years ago.  His smokeless tobacco use included chew. He reports current alcohol use of about 5.0 standard drinks of alcohol per week. He reports that he does not use drugs.  Outpatient Medications Prior to Visit  Medication Sig Dispense Refill   acetaminophen (TYLENOL) 650 MG CR tablet Take 1,300 mg by mouth every 8 (eight) hours as needed for pain. Before or after playing golf     amLODipine (NORVASC) 5 MG tablet TAKE 1 TABLET BY MOUTH ONCE DAILY 90 tablet 3   Ascorbic Acid (VITAMIN C) 1000 MG tablet Take 1,000 mg by mouth daily.     calcium carbonate (TUMS) 500 MG chewable tablet 1 tablet Orally Four times a day     cholecalciferol (VITAMIN D) 1000 units tablet Take 1,000 Units by mouth every morning.      ezetimibe (ZETIA) 10 MG tablet Take 1 tablet (10 mg total) by mouth daily. Additional refill available at office visit 90 tablet 3  folic acid (FOLVITE) 1 MG tablet TAKE 1 TABLET EVERY DAY 90 tablet 0   hydrOXYzine (ATARAX) 10 MG tablet TAKE ONE TABLET THREE TIMES A DAY AS NEEDED FOR ANXIETY 30 tablet 2   meloxicam (MOBIC) 15 MG tablet TAKE 1 TABLET BY MOUTH ONCE DAILY WITH FOOD 30 tablet 2   metroNIDAZOLE (METROGEL) 0.75 % gel Apply 1 Application topically 2 (two) times daily.     Misc Natural Products (IBEROGAST  PO) Take 1 tablet by mouth 3 (three) times daily before meals.     ondansetron (ZOFRAN ODT) 4 MG disintegrating tablet Take 1 tablet (4 mg total) by mouth every 8 (eight) hours as needed for nausea or vomiting. 20 tablet 0   pantoprazole (PROTONIX) 40 MG tablet Take 40 mg by mouth daily.     sildenafil (VIAGRA) 100 MG tablet Take 100 mg by mouth daily as needed.     UNABLE TO FIND Corner Crack Cream Apply to corners of mouth qid     losartan (COZAAR) 100 MG tablet TAKE 1 TABLET BY MOUTH DAILY 90 tablet 1   rosuvastatin (CRESTOR) 10 MG tablet TAKE 1 TABLET BY MOUTH DAILY 90 tablet 1   No facility-administered medications prior to visit.    Review of Systems:  Patient denies headache, fevers, malaise, unintentional weight loss, skin rash, eye pain, sinus congestion and sinus pain, sore throat, dysphagia,  hemoptysis , cough, dyspnea, wheezing, chest pain, palpitations, orthopnea, edema, abdominal pain, nausea, melena, diarrhea, constipation, flank pain, dysuria, hematuria, urinary  Frequency, nocturia, numbness, tingling, seizures,  Focal weakness, Loss of consciousness,  Tremor, insomnia, depression, anxiety, and suicidal ideation.     Objective:  BP 124/60   Pulse 64   Ht 5\' 10"  (1.778 m)   Wt 195 lb 3.2 oz (88.5 kg)   SpO2 97%   BMI 28.01 kg/m   Physical Exam Vitals reviewed.  Constitutional:      General: He is not in acute distress.    Appearance: Normal appearance. He is normal weight. He is not ill-appearing, toxic-appearing or diaphoretic.  HENT:     Head: Normocephalic.  Eyes:     General: No scleral icterus.       Right eye: No discharge.        Left eye: No discharge.     Conjunctiva/sclera: Conjunctivae normal.  Cardiovascular:     Rate and Rhythm: Normal rate and regular rhythm.     Heart sounds: Normal heart sounds.  Pulmonary:     Effort: Pulmonary effort is normal. No respiratory distress.     Breath sounds: Normal breath sounds.  Musculoskeletal:         General: Normal range of motion.     Cervical back: Normal range of motion.  Skin:    General: Skin is warm and dry.  Neurological:     General: No focal deficit present.     Mental Status: He is alert and oriented to person, place, and time. Mental status is at baseline.  Psychiatric:        Attention and Perception: Attention normal.        Mood and Affect: Mood is anxious.        Speech: Speech normal.        Behavior: Behavior normal.        Thought Content: Thought content normal.        Cognition and Memory: Cognition normal.        Judgment: Judgment normal.    Assessment & Plan:  Primary hypertension -     Comprehensive metabolic panel with GFR  Hyperlipidemia, unspecified hyperlipidemia type -     Rosuvastatin Calcium; Take 1 tablet (10 mg total) by mouth daily.  Dispense: 90 tablet; Refill: 1  Decreased calculated glomerular filtration rate (GFR) -     Basic metabolic panel with GFR; Future  Nausea Assessment & Plan: Chronic  for > 4 years with periods of resolution.  Has seen GI.  Continue PPI, limit ETOH to 2 drinks/night    History of alcohol abuse Assessment & Plan: He has reduced his daily intake to 4 shots    Other orders -     Losartan Potassium; Take 1 tablet (100 mg total) by mouth daily.  Dispense: 90 tablet; Refill: 1     Follow-up: Return in about 6 months (around 08/16/2024).   I provided 30 minutes during this encounter reviewing patient's last visit with previous provider,  gastroenterology and rheumatology, most recent imaging studies and labs.  Provided counseling on the above mentioned problems and coordination of care.   Thersia Flax, MD

## 2024-02-15 NOTE — Patient Instructions (Signed)
 Nice to meet you!  No changes to medications except that you can increase your vitamin D to 2000 Ius daily

## 2024-02-16 ENCOUNTER — Encounter: Payer: Self-pay | Admitting: Internal Medicine

## 2024-02-16 LAB — COMPREHENSIVE METABOLIC PANEL WITH GFR
ALT: 19 U/L (ref 0–53)
AST: 18 U/L (ref 0–37)
Albumin: 4.3 g/dL (ref 3.5–5.2)
Alkaline Phosphatase: 42 U/L (ref 39–117)
BUN: 23 mg/dL (ref 6–23)
CO2: 27 meq/L (ref 19–32)
Calcium: 9.2 mg/dL (ref 8.4–10.5)
Chloride: 104 meq/L (ref 96–112)
Creatinine, Ser: 1.32 mg/dL (ref 0.40–1.50)
GFR: 52.55 mL/min — ABNORMAL LOW (ref >60.00)
Glucose, Bld: 91 mg/dL (ref 70–99)
Potassium: 4.1 meq/L (ref 3.5–5.1)
Sodium: 139 meq/L (ref 135–145)
Total Bilirubin: 0.5 mg/dL (ref 0.2–1.2)
Total Protein: 6.4 g/dL (ref 6.0–8.3)

## 2024-02-16 NOTE — Assessment & Plan Note (Signed)
 He has reduced his daily intake to 4 shots

## 2024-02-16 NOTE — Assessment & Plan Note (Signed)
 Chronic issue.  Continue Protonix 40 mg daily. EGD due this year. , [facility]

## 2024-02-16 NOTE — Assessment & Plan Note (Addendum)
 Managed gy [facility] with methotrexate,  which he stopped taking last year AMA while his RA was in remission . He has no joint complaints today

## 2024-02-16 NOTE — Assessment & Plan Note (Signed)
 Chronic  for > 4 years with periods of resolution.  Has seen GI.  Continue PPI, limit ETOH to 2 drinks/night

## 2024-02-18 ENCOUNTER — Other Ambulatory Visit: Payer: Self-pay | Admitting: Cardiovascular Disease

## 2024-03-21 ENCOUNTER — Other Ambulatory Visit (INDEPENDENT_AMBULATORY_CARE_PROVIDER_SITE_OTHER)

## 2024-03-21 DIAGNOSIS — R944 Abnormal results of kidney function studies: Secondary | ICD-10-CM | POA: Diagnosis not present

## 2024-03-21 LAB — BASIC METABOLIC PANEL WITH GFR
BUN: 22 mg/dL (ref 6–23)
CO2: 26 meq/L (ref 19–32)
Calcium: 9.2 mg/dL (ref 8.4–10.5)
Chloride: 104 meq/L (ref 96–112)
Creatinine, Ser: 1.3 mg/dL (ref 0.40–1.50)
GFR: 53.49 mL/min — ABNORMAL LOW (ref >60.00)
Glucose, Bld: 109 mg/dL — ABNORMAL HIGH (ref 70–99)
Potassium: 3.9 meq/L (ref 3.5–5.1)
Sodium: 137 meq/L (ref 135–145)

## 2024-03-22 ENCOUNTER — Ambulatory Visit: Payer: Self-pay | Admitting: Internal Medicine

## 2024-03-22 ENCOUNTER — Other Ambulatory Visit: Payer: Self-pay | Admitting: Internal Medicine

## 2024-03-22 DIAGNOSIS — N182 Chronic kidney disease, stage 2 (mild): Secondary | ICD-10-CM | POA: Insufficient documentation

## 2024-03-22 DIAGNOSIS — N183 Chronic kidney disease, stage 3 unspecified: Secondary | ICD-10-CM | POA: Insufficient documentation

## 2024-03-22 NOTE — Assessment & Plan Note (Signed)
 Persistent

## 2024-05-16 ENCOUNTER — Other Ambulatory Visit: Payer: Self-pay

## 2024-05-16 NOTE — Telephone Encounter (Signed)
 Last refilled by Dr. Maribeth in 11/2023. Is it okay to refill?  Last OV: 02/15/2024 with Dr. Marylynn Next OV: 08/15/2024

## 2024-05-17 MED ORDER — HYDROXYZINE HCL 10 MG PO TABS: ORAL_TABLET | ORAL | 2 refills | Status: AC

## 2024-05-22 ENCOUNTER — Other Ambulatory Visit: Payer: Self-pay | Admitting: Cardiovascular Disease

## 2024-06-01 NOTE — Progress Notes (Signed)
 [facility] Office Note  Date:  06/04/2024   ID:  Julian Pineda, DOB [birth year], MRN [id number]  PCP:  Julian Verneita CROME, MD   Chief Complaint  Patient presents with   Follow-up    C/o- Coronary artery disease, patient complains of no pain and no shortness of breath.    HPI:  Mr. Julian Pineda is a 78 year old gentleman with past medical history of Syncope   06/2016 , 12/2017, December 2022 Moderate to severe coronary artery disease cardiac CTA February 2023 Who presents for follow-up of his syncope, coronary artery disease  Last seen in clinic March 2024 Plays golf, goes to gym 2x a week, Weights 30 min  2x a week, stretching Denies chest pain concerning for angina No significant shortness of breath on exertion  Chronic nausea, started after gall bladder surgery Some anxiety at times  Labs reviewed and of 2024 Total chol 139 LDL 51 on Zetia  with Crestor  10 A1C 5.9  Cardiac CTA  Moderate mid LAD disease Severe greater than 70% proximal LAD disease FFR LAD 0.85  No near syncope or syncope episodes  EKG personally reviewed by myself on todays visit EKG Interpretation Date/Time:  Monday June 04 2024 08:26:54 EDT Ventricular Rate:  66 PR Interval:  172 QRS Duration:  90 QT Interval:  380 QTC Calculation: 398 R Axis:   6  Text Interpretation: Sinus rhythm with Premature atrial complexes When compared with ECG of 05-May-2022 08:26, Premature atrial complexes are now Present Confirmed by Julian Pineda [phone]) on 06/04/2024 8:45:03 AM   Other past medical history reviewed events from October 19, 2021 Syncope at gym,  second episode at restaurant  Prior episodes of syncope, August 2017 syncope 2 after playing 18 holes of golf in th heat Was dehydrated, CR 1.7, BUN 24   hospitalized from 01/27/18-01/29/18 for syncope.  Was out to eat, did not eat much, did not feel well he had had 3 alcoholic beverages   sitting in the car when he started to feel weak  EMS  found his blood pressure to be 60/40.    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  PSH:    Past Surgical History:  Procedure Laterality Date   ACHILLES TENDON SURGERY Right 1984   CARPAL TUNNEL RELEASE Bilateral 04/26/2022   CHOLECYSTECTOMY N/A 09/02/2021   Procedure: LAPAROSCOPIC CHOLECYSTECTOMY;  Surgeon: Julian Berg, MD;  Location: [facility];  Service: General;  Laterality: N/A;   COLONOSCOPY  2008   COLONOSCOPY WITH PROPOFOL  N/A 11/09/2017   Procedure: COLONOSCOPY WITH PROPOFOL ;  Surgeon: Julian Lamar DASEN, MD;  Location: [facility] END[facility];  Service: Endoscopy;  Laterality: N/A;   MOHS SURGERY  09-25-2014;  11-27-2013   [facility]   09-25-2014 right lower eyelid /  11-27-2013  left lateral cheek   RADIOACTIVE SEED IMPLANT N/A 06/17/2017   Procedure: RADIOACTIVE SEED IMPLANT/BRACHYTHERAPY IMPLANT;  Surgeon: Julian Cough, MD;  Location: [facility];  Service: Urology;  Laterality: N/A;   TONSILLECTOMY  child   TOTAL HIP ARTHROPLASTY Left 09/29/2021   Procedure: TOTAL HIP ARTHROPLASTY ANTERIOR APPROACH;  Surgeon: Julian Cough, MD;  Location: [facility];  Service: Orthopedics;  Laterality: Left;  TOTAL KNEE ARTHROPLASTY  12/04/2012   Procedure: TOTAL KNEE ARTHROPLASTY;  Surgeon: Julian Raman, MD;  Location: [facility] OR;  Service: Orthopedics;  Laterality: Left;  left total knee arthroplasty   VARICOSE VEIN SURGERY  2000   left leg    Current Outpatient Medications  Medication Sig Dispense Refill   acetaminophen  (TYLENOL ) 650 MG CR tablet Take 1,300 mg  by mouth every 8 (eight) hours as needed for pain. Before or after playing golf     amLODipine  (NORVASC ) 5 MG tablet TAKE 1 TABLET BY MOUTH ONCE DAILY 90 tablet 3   Ascorbic Acid  (VITAMIN C) 1000 MG tablet Take 1,000 mg by mouth daily.     calcium  carbonate (TUMS) 500 MG chewable tablet 1 tablet Orally Four times a day     cholecalciferol  (VITAMIN D ) 1000 units tablet Take 1,000 Units by mouth every morning.      ezetimibe  (ZETIA ) 10 MG tablet TAKE ONE TABLET BY MOUTH DAILY. 90 tablet 0   fluticasone  (FLONASE ) 50 MCG/ACT nasal spray 2 sprays as needed.     folic acid  (FOLVITE ) 1 MG tablet TAKE 1 TABLET EVERY DAY 90 tablet 0   hydrOXYzine  (ATARAX ) 10 MG tablet TAKE ONE TABLET THREE TIMES A DAY AS NEEDED FOR ANXIETY 90 tablet 2   losartan  (COZAAR ) 100 MG tablet Take 1 tablet (100 mg total) by mouth daily. 90 tablet 1   meloxicam  (MOBIC ) 15 MG tablet Take 15 mg by mouth daily. (Patient taking differently: Take 15 mg by mouth as needed.)     metroNIDAZOLE (METROGEL) 0.75 % gel Apply 1 Application topically 2 (two) times daily.     Misc Natural Products (IBEROGAST PO) Take 1 tablet by mouth 3 (three) times daily before meals.     ondansetron  (ZOFRAN  ODT) 4 MG disintegrating tablet Take 1 tablet (4 mg total) by mouth every 8 (eight) hours as needed for nausea or vomiting. 20 tablet 0   pantoprazole  (PROTONIX ) 40 MG tablet Take 40 mg by mouth daily.     rosuvastatin  (CRESTOR ) 10 MG tablet Take 1 tablet (10 mg total) by mouth daily. 90 tablet 1   sildenafil  (VIAGRA ) 100 MG tablet Take 100 mg by mouth daily as needed.     UNABLE TO FIND Corner Crack Cream Apply to corners of mouth qid     No current facility-administered medications for this visit.    Allergies:   Penicillins   Social History:  The patient  reports that he has never smoked. He quit smokeless tobacco use about 8 years ago.  His smokeless tobacco use included chew. He reports current alcohol use of about 5.0 standard drinks of alcohol  per week. He reports that he does not use drugs.   Family History:   family history includes Alcohol abuse in his father; Arthritis in his father; Breast cancer in his daughter; Cancer in his mother; Prostate cancer in his brother.    Review of Systems: Review of Systems  Constitutional: Negative.   HENT: Negative.    Respiratory: Negative.    Cardiovascular: Negative.   Gastrointestinal: Negative.   Musculoskeletal: Negative.   Neurological: Negative.   Psychiatric/Behavioral: Negative.    All other systems reviewed and are negative.   PHYSICAL EXAM: VS:  BP 122/60 (BP Location: Left Arm, Patient Position: Sitting, Cuff Size: Normal)   Pulse 66   Ht 5' 10 (1.778 m)   Wt 191 lb (86.6 kg)   SpO2 93%   BMI 27.41 kg/m  , BMI Body mass  index is 27.41 kg/m. Constitutional:  oriented to person, place, and time. No distress.  HENT:  Head: Grossly normal Eyes:  no discharge. No scleral icterus.  Neck: No JVD, no carotid bruits  Cardiovascular: Regular rate and rhythm, no murmurs appreciated Pulmonary/Chest: Clear to auscultation bilaterally, no wheezes or rails Abdominal: Soft.  no distension.  no tenderness.  Musculoskeletal: Normal range of motion Neurological:  normal muscle tone. Coordination normal. No atrophy Skin: Skin warm and dry Psychiatric: normal affect, pleasant   Recent Labs: 09/19/2023: Hemoglobin 14.2; Platelets 249.0; TSH 3.19 02/15/2024: ALT 19 03/21/2024: BUN 22; Creatinine, Ser 1.30; Potassium 3.9; Sodium 137    Lipid Panel Lab Results  Component Value Date   CHOL 139 09/19/2023   HDL 63.90 09/19/2023   LDLCALC 51 09/19/2023   TRIG 118.0 09/19/2023      Wt Readings from Last 3 Encounters:  06/04/24 191 lb (86.6 kg)  02/15/24 195 lb 3.2 oz (88.5 kg)  09/19/23 192 lb 9.6 oz (87.4 kg)     ASSESSMENT AND PLAN:  Coronary artery disease with stable angina Cardiac CTA showing significant three-vessel coronary disease Currently with no symptoms of  angina. No further workup at this time. Continue current medication regimen.  Hyperlipidemia LDL at goal, 50s continue Crestor  10 with Zetia  10  Syncope, unspecified syncope type  2 episodes several years ago possible vasovagal etiology, 1 with dehydration, 1 possible alcohol involvement with poor fluid intake No recent episodes, stays hydrated  Malignant neoplasm of prostate (HCC) Managed by primary care  Essential hypertension Blood pressure is well controlled on today's visit. No changes made to the medications.   Orders Placed This Encounter  Procedures   EKG 12-Lead     Signed, Velinda Lunger, M.D., Ph.D. 06/04/2024  [facility] [place], [place] [phone]

## 2024-06-04 ENCOUNTER — Encounter: Payer: Self-pay | Admitting: Cardiovascular Disease

## 2024-06-04 ENCOUNTER — Ambulatory Visit: Attending: Cardiovascular Disease | Admitting: Cardiovascular Disease

## 2024-06-04 VITALS — BP 122/60 | HR 66 | Ht 70.0 in | Wt 191.0 lb

## 2024-06-04 DIAGNOSIS — N182 Chronic kidney disease, stage 2 (mild): Secondary | ICD-10-CM

## 2024-06-04 DIAGNOSIS — E782 Mixed hyperlipidemia: Secondary | ICD-10-CM

## 2024-06-04 DIAGNOSIS — I10 Essential (primary) hypertension: Secondary | ICD-10-CM | POA: Diagnosis not present

## 2024-06-04 DIAGNOSIS — I25118 Atherosclerotic heart disease of native coronary artery with other forms of angina pectoris: Secondary | ICD-10-CM | POA: Diagnosis not present

## 2024-06-04 DIAGNOSIS — R55 Syncope and collapse: Secondary | ICD-10-CM | POA: Diagnosis not present

## 2024-06-04 MED ORDER — EZETIMIBE 10 MG PO TABS: 10.0000 mg | ORAL_TABLET | Freq: Every day | ORAL | 3 refills | Status: AC

## 2024-06-04 NOTE — Patient Instructions (Addendum)

## 2024-08-01 ENCOUNTER — Ambulatory Visit: Admitting: *Deleted

## 2024-08-01 VITALS — Ht 70.0 in | Wt 186.0 lb

## 2024-08-01 DIAGNOSIS — Z0000 Encounter for general adult medical examination without abnormal findings: Secondary | ICD-10-CM

## 2024-08-01 NOTE — Patient Instructions (Signed)
 Julian Pineda,  Thank you for taking the time for your Medicare Wellness Visit. I appreciate your continued commitment to your health goals. Please review the care plan we discussed, and feel free to reach out if I can assist you further.  Medicare recommends these wellness visits once per year to help you and your care team stay ahead of potential health issues. These visits are designed to focus on prevention, allowing your provider to concentrate on managing your acute and chronic conditions during your regular appointments.  Please note that Annual Wellness Visits do not include a physical exam. Some assessments may be limited, especially if the visit was conducted virtually. If needed, we may recommend a separate in-person follow-up with your provider.  Ongoing Care Seeing your primary care provider every 3 to 6 months helps us  monitor your health and provide consistent, personalized care.  Consider updating your vaccines.  Referrals If a referral was made during today's visit and you haven't received any updates within two weeks, please contact the referred provider directly to check on the status.  Recommended Screenings:  Health Maintenance  Topic Date Due   COVID-19 Vaccine (3 - Pfizer risk series) 02/06/2020   Flu Shot  Never done   Medicare Annual Wellness Visit  08/01/2025   DTaP/Tdap/Td vaccine (3 - Td or Tdap) 08/11/2025   Hepatitis C Screening  Completed   HPV Vaccine  Aged Out   Meningitis B Vaccine  Aged Out   Pneumococcal Vaccine for age over 13  Discontinued   Colon Cancer Screening  Discontinued   Zoster (Shingles) Vaccine  Discontinued       08/01/2024    3:51 PM  Advanced Directives  Does Patient Have a Medical Advance Directive? Yes  Type of Estate agent of [place];Living will  Copy of Healthcare Power of Attorney in Chart? No - copy requested   Advance Care Planning is important because it: Ensures you receive medical care that  aligns with your values, goals, and preferences. Provides guidance to your family and loved ones, reducing the emotional burden of decision-making during critical moments.  Vision: Annual vision screenings are recommended for early detection of glaucoma, cataracts, and diabetic retinopathy. These exams can also reveal signs of chronic conditions such as diabetes and high blood pressure.  Dental: Annual dental screenings help detect early signs of oral cancer, gum disease, and other conditions linked to overall health, including heart disease and diabetes.  Please see the attached documents for additional preventive care recommendations.

## 2024-08-01 NOTE — Progress Notes (Signed)
 Subjective:   Julian Pineda is a 78 y.o. who presents for a Medicare Wellness preventive visit.  As a reminder, Annual Wellness Visits don't include a physical exam, and some assessments may be limited, especially if this visit is performed virtually. We may recommend an in-person follow-up visit with your provider if needed.  Visit Complete: Virtual I connected with  Julian Pineda on 08/01/24 by a video and audio enabled telemedicine application and verified that I am speaking with the correct person using two identifiers.  Patient Location: Home  Provider Location: Home Office  I discussed the limitations of evaluation and management by telemedicine. The patient expressed understanding and agreed to proceed.  Vital Signs: Because this visit was a virtual/telehealth visit, some criteria may be missing or patient reported. Any vitals not documented were not able to be obtained and vitals that have been documented are patient reported.    Persons Participating in Visit: Patient.  AWV Questionnaire: Yes: Patient Medicare AWV questionnaire was completed by the patient on 07/28/24; I have confirmed that all information answered by patient is correct and no changes since this date.  Cardiac Risk Factors include: advanced age (>30men, >29 women);male gender;hypertension;dyslipidemia     Objective:    Today's Vitals   08/01/24 1535  Weight: 186 lb (84.4 kg)  Height: 5' 10 (1.778 m)   Body mass index is 26.69 kg/m.     08/01/2024    3:51 PM 07/06/2023    3:28 PM 06/28/2022   12:52 PM 05/05/2022    8:30 PM 05/05/2022    8:11 AM 10/19/2021    9:59 AM 09/16/2021    8:23 AM  Advanced Directives  Does Patient Have a Medical Advance Directive? Yes Yes Yes Yes No No Yes  Type of Estate agent of [place];Living will Healthcare Power of [place];Living will Healthcare Power of [place];Living will Healthcare Power of [place];Living will   Healthcare Power of  [place];Living will  Does patient want to make changes to medical advance directive?  No - Patient declined No - Patient declined No - Patient declined     Copy of Healthcare Power of Attorney in Chart? No - copy requested No - copy requested No - copy requested No - copy requested     Would patient like information on creating a medical advance directive?    No - Patient declined No - Patient declined No - Patient declined     Current Medications (verified) Outpatient Encounter Medications as of 08/01/2024  Medication Sig   acetaminophen  (TYLENOL ) 650 MG CR tablet Take 1,300 mg by mouth every 8 (eight) hours as needed for pain. Before or after playing golf   amLODipine  (NORVASC ) 5 MG tablet TAKE 1 TABLET BY MOUTH ONCE DAILY   Ascorbic Acid  (VITAMIN C) 1000 MG tablet Take 1,000 mg by mouth daily.   calcium  carbonate (TUMS) 500 MG chewable tablet 1 tablet Orally Four times a day   cholecalciferol  (VITAMIN D ) 1000 units tablet Take 1,000 Units by mouth every morning.    ezetimibe  (ZETIA ) 10 MG tablet Take 1 tablet (10 mg total) by mouth daily.   fluticasone  (FLONASE ) 50 MCG/ACT nasal spray 2 sprays as needed.   folic acid  (FOLVITE ) 1 MG tablet TAKE 1 TABLET EVERY DAY   hydrOXYzine  (ATARAX ) 10 MG tablet TAKE ONE TABLET THREE TIMES A DAY AS NEEDED FOR ANXIETY   losartan  (COZAAR ) 100 MG tablet Take 1 tablet (100 mg total) by mouth daily.   meloxicam  (MOBIC ) 15  MG tablet Take 15 mg by mouth daily.   metroNIDAZOLE (METROGEL) 0.75 % gel Apply 1 Application topically 2 (two) times daily.   Misc Natural Products (IBEROGAST PO) Take 1 tablet by mouth 3 (three) times daily before meals. (Patient taking differently: Take 1 tablet by mouth 2 (two) times daily before a meal.)   ondansetron  (ZOFRAN  ODT) 4 MG disintegrating tablet Take 1 tablet (4 mg total) by mouth every 8 (eight) hours as needed for nausea or vomiting.   pantoprazole  (PROTONIX ) 40 MG tablet Take 40 mg by mouth daily.   rosuvastatin   (CRESTOR ) 10 MG tablet Take 1 tablet (10 mg total) by mouth daily.   sildenafil  (VIAGRA ) 100 MG tablet Take 100 mg by mouth daily as needed.   UNABLE TO FIND Corner Crack Cream Apply to corners of mouth qid   No facility-administered encounter medications on file as of 08/01/2024.    Allergies (verified) Penicillins   History: Past Medical History:  Diagnosis Date   Arthritis, rheumatoid (HCC)    Diverticulosis of colon    GERD (gastroesophageal reflux disease)    occas   History of basal cell carcinoma (BCC) excision    09-25-2014  right lower eyelid s/p moh's dx /  left lower eyelid scheduled removal 07-05-2017   History of squamous cell carcinoma excision    01/ 2015  left lateral cheek  s/p  moh's sx   Hypertension    sees Dr. Rudell Pineda, [place]   Nocturia    OA (osteoarthritis)    Prostate cancer [facility]) urologist-  dr eskidge/  oncologist- dr patrcia   dx 04/ 2018-- Stage T1c, Gleason 3+3,  PSA 7.1,  vol 38.6cc   Rheumatoid arteritis (HCC)    rheumotologist-   dr lillian   Rosacea    Dr. Jakie   Syncope 01/27/2018   Syncope 05/05/2022   Vitamin D  deficiency    Past Surgical History:  Procedure Laterality Date   ACHILLES TENDON SURGERY Right 1984   CARPAL TUNNEL RELEASE Bilateral 04/26/2022   CHOLECYSTECTOMY N/A 09/02/2021   Procedure: LAPAROSCOPIC CHOLECYSTECTOMY;  Surgeon: Vernetta Berg, MD;  Location: [facility];  Service: General;  Laterality: N/A;   COLONOSCOPY  2008   COLONOSCOPY WITH PROPOFOL  N/A 11/09/2017   Procedure: COLONOSCOPY WITH PROPOFOL ;  Surgeon: Viktoria Lamar DASEN, MD;  [facility];  Service: Endoscopy;  Laterality: N/A;   MOHS SURGERY  09-25-2014;  11-27-2013   [facility]   09-25-2014 right lower eyelid /  11-27-2013  left lateral cheek   RADIOACTIVE SEED IMPLANT N/A 06/17/2017   Procedure: RADIOACTIVE SEED IMPLANT/BRACHYTHERAPY IMPLANT;  Surgeon: Nieves Cough, MD;  Location[facility];  Service: Urology;   Laterality: N/A;   TONSILLECTOMY  child   TOTAL HIP ARTHROPLASTY Left 09/29/2021   Procedure: TOTAL HIP ARTHROPLASTY ANTERIOR APPROACH;  Surgeon: Ernie Cough, MD;  Location: [facility];  Service: Orthopedics;  Laterality: Left;   TOTAL KNEE ARTHROPLASTY  12/04/2012   Procedure: TOTAL KNEE ARTHROPLASTY;  Surgeon: Marcey Raman, MD;  Location: [facility] OR;  Service: Orthopedics;  Laterality: Left;  left total knee arthroplasty   VARICOSE VEIN SURGERY  2000   left leg   Family History  Problem Relation Age of Onset   Cancer Mother        Multiple Myeloma   Alcohol abuse Father    Arthritis Father    Prostate cancer Brother    Breast cancer Daughter    Hematuria Neg Hx    Sickle cell anemia Neg Hx  Kidney cancer Neg Hx    Bladder Cancer Neg Hx    Social History   Socioeconomic History   Marital status: Married    Spouse name: Romero   Number of children: Not on file   Years of education: Not on file   Highest education level: Bachelor's degree (e.g., BA, AB, BS)  Occupational History   Occupation: retired    Comment: banking  Tobacco Use   Smoking status: Never   Smokeless tobacco: Former    Types: Chew    Quit date: 06/06/2016  Vaping Use   Vaping status: Never Used  Substance and Sexual Activity   Alcohol use: Yes    Alcohol/week: 5.0 standard drinks of alcohol    Types: 5 Standard drinks or equivalent per week    Comment: 1 vodka tonic 5 days a week   Drug use: No   Sexual activity: Yes  Other Topics Concern   Not on file  Social History Narrative   Moderate alcohol use   Lives in [place] with wife, has a daughter and son   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 Treadmill 5 days a week   Likes to [employer]: Low Risk  (07/28/2024)   Overall Financial Resource Strain (CARDIA)    Difficulty of Paying Living Expenses: Not hard at all  Food Insecurity: No Food Insecurity (07/28/2024)    Hunger Vital Sign    Worried About Running Out of Food in the Last Year: Never true    Ran Out of Food in the Last Year: Never true  Transportation Needs: No Transportation Needs (07/28/2024)   PRAPARE - Administrator, Civil Service (Medical): No    Lack of Transportation (Non-Medical): No  Physical Activity: Sufficiently Active (07/28/2024)   Exercise Vital Sign    Days of Exercise per Week: 5 days    Minutes of Exercise per Session: 30 min  Stress: No Stress Concern Present (07/28/2024)   [employer] of Occupational Health - Occupational Stress Questionnaire    Feeling of Stress: Not at all  Social Connections: Socially Integrated (07/28/2024)   Social Connection and Isolation Panel    Frequency of Communication with Friends and Family: More than three times a week    Frequency of Social Gatherings with Friends and Family: More than three times a week    Attends Religious Services: More than 4 times per year    Active Member of [employer] or Organizations: Yes    Attends Engineer, structural: More than 4 times per year    Marital Status: Married    Tobacco Counseling Counseling given: Not Answered    Clinical Intake:  Pre-visit preparation completed: Yes  Pain : No/denies pain     BMI - recorded: 26.69 Nutritional Status: BMI 25 -29 Overweight Nutritional Risks: None Diabetes: No  Lab Results  Component Value Date   HGBA1C 5.9 09/19/2023   HGBA1C 5.0 09/07/2021   HGBA1C 5.4 02/21/2018     How often do you need to have someone help you when you read instructions, pamphlets, or other written materials from your doctor or pharmacy?: 1 - Never  Interpreter Needed?: No  Information entered by :: R. Zamia Tyminski LPN   Activities of Daily Living     08/01/2024    3:37 PM  In your present state of health, do you have any difficulty performing the  following activities:  Hearing? 0  Vision? 0  Difficulty concentrating or making decisions? 0  Walking  or climbing stairs? 0  Dressing or bathing? 0  Doing errands, shopping? 0  Preparing Food and eating ? N  Using the Toilet? N  In the past six months, have you accidently leaked urine? N  Do you have problems with loss of bowel control? N  Managing your Medications? N  Managing your Finances? N  Housekeeping or managing your Housekeeping? N    Patient Care Team: Marylynn Verneita CROME, MD as PCP - General (Internal Medicine) Perla Evalene PARAS, MD as Consulting Physician (Cardiology) Dasher, Alm LABOR, MD (Dermatology) [place], Teodoro K, MD as Consulting Physician (Gastroenterology) Mai Lynwood FALCON, MD as Consulting Physician (Rheumatology)  I have updated your Care Teams any recent Medical Services you may have received from other providers in the past year.     Assessment:   This is a routine wellness examination for Devlyn.  Hearing/Vision screen Hearing Screening - Comments:: No issues Vision Screening - Comments:: Readers    Goals Addressed             This Visit's Progress    Patient Stated       Wants to eat right and stay active and exercise        Depression Screen     08/01/2024    3:47 PM 02/15/2024    3:37 PM 09/19/2023   10:33 AM 07/06/2023    3:25 PM 03/15/2023    9:07 AM 09/10/2022   10:35 AM 06/28/2022   12:45 PM  PHQ 2/9 Scores  PHQ - 2 Score 0 0 0 0 0 0 0  PHQ- 9 Score 0  0 0 0      Fall Risk     08/01/2024    3:39 PM 02/15/2024    3:37 PM 09/19/2023   10:33 AM 07/02/2023    9:19 AM 03/15/2023    9:07 AM  Fall Risk   Falls in the past year? 0 0 0 0 0  Number falls in past yr: 0 0 0 0 0  Injury with Fall? 0 0 0 0 0  Risk for fall due to : No Fall Risks No Fall Risks No Fall Risks No Fall Risks No Fall Risks  Follow up Falls evaluation completed;Falls prevention discussed Falls evaluation completed Falls evaluation completed Falls prevention discussed Falls evaluation completed    MEDICARE RISK AT HOME:  Medicare Risk at Home Any stairs in or  around the home?: Yes If so, are there any without handrails?: No Home free of loose throw rugs in walkways, pet beds, electrical cords, etc?: Yes Adequate lighting in your home to reduce risk of falls?: Yes Life alert?: No Use of a cane, walker or w/c?: No Grab bars in the bathroom?: Yes Shower chair or bench in shower?: Yes Elevated toilet seat or a handicapped toilet?: Yes  TIMED UP AND GO:  Was the test performed?  No  Cognitive Function: 6CIT completed    09/19/2017    8:45 AM  MMSE - Mini Mental State Exam  Orientation to time 5   Orientation to Place 5   Registration 3   Attention/ Calculation 5   Recall 3   Language- name 2 objects 2   Language- repeat 1  Language- follow 3 step command 3   Language- read & follow direction 1   Write a sentence 1   Copy design 1   Total score 30  Data saved with a previous flowsheet row definition        08/01/2024    3:51 PM 07/06/2023    3:30 PM 06/28/2022   12:44 PM 09/25/2019    8:53 AM 09/20/2018    8:48 AM  6CIT Screen  What Year? 0 points 0 points 0 points 0 points 0 points  What month? 0 points 0 points 0 points 0 points 0 points  What time? 0 points 0 points 0 points 0 points 0 points  Count back from 20 0 points 0 points 0 points 0 points 0 points  Months in reverse 0 points 0 points 0 points 0 points 0 points  Repeat phrase 0 points 0 points 0 points 0 points 0 points  Total Score 0 points 0 points 0 points 0 points 0 points    Immunizations Immunization History  Administered Date(s) Administered   DTaP 02/19/2010   PFIZER(Purple Top)SARS-COV-2 Vaccination 12/14/2019, 01/09/2020   Pneumococcal Conjugate-13 09/26/2019   Tdap 08/12/2015    Screening Tests Health Maintenance  Topic Date Due   COVID-19 Vaccine (3 - Pfizer risk series) 02/06/2020   Influenza Vaccine  Never done   Medicare Annual Wellness (AWV)  07/05/2024   DTaP/Tdap/Td (3 - Td or Tdap) 08/11/2025   Hepatitis C Screening  Completed    HPV VACCINES  Aged Out   Meningococcal B Vaccine  Aged Out   Pneumococcal Vaccine: 50+ Years  Discontinued   Colonoscopy  Discontinued   Zoster Vaccines- Shingrix  Discontinued    Health Maintenance Items Addressed: Patient declines vaccines at this time.  Additional Screening:  Vision Screening: Recommended annual ophthalmology exams for early detection of glaucoma and other disorders of the eye. Is the patient up to date with their annual eye exam?  Yes  Who is the provider or what is the name of the office in which the patient attends annual eye exams? [facility]  Dental Screening: Recommended annual dental exams for proper oral hygiene  Community Resource Referral / Chronic Care Management: CRR required this visit?  No   CCM required this visit?  No   Plan:    I have personally reviewed and noted the following in the patient's chart:   Medical and social history Use of alcohol, tobacco or illicit drugs  Current medications and supplements including opioid prescriptions. Patient is not currently taking opioid prescriptions. Functional ability and status Nutritional status Physical activity Advanced directives List of other physicians Hospitalizations, surgeries, and ER visits in previous 12 months Vitals Screenings to include cognitive, depression, and falls Referrals and appointments  In addition, I have reviewed and discussed with patient certain preventive protocols, quality metrics, and best practice recommendations. A written personalized care plan for preventive services as well as general preventive health recommendations were provided to patient.   Angeline Fredericks, LPN   [id number]/06/[id number]   After Visit Summary: (MyChart) Due to this being a telephonic visit, the after visit summary with patients personalized plan was offered to patient via MyChart   Notes: Nothing significant to report at this time.

## 2024-08-08 LAB — LAB REPORT - SCANNED: EGFR: 76

## 2024-08-15 ENCOUNTER — Encounter: Payer: Self-pay | Admitting: Internal Medicine

## 2024-08-15 ENCOUNTER — Ambulatory Visit: Admitting: Internal Medicine

## 2024-08-15 VITALS — BP 118/64 | HR 75 | Ht 70.0 in | Wt 194.0 lb

## 2024-08-15 DIAGNOSIS — M069 Rheumatoid arthritis, unspecified: Secondary | ICD-10-CM

## 2024-08-15 DIAGNOSIS — I10 Essential (primary) hypertension: Secondary | ICD-10-CM | POA: Diagnosis not present

## 2024-08-15 DIAGNOSIS — E785 Hyperlipidemia, unspecified: Secondary | ICD-10-CM

## 2024-08-15 DIAGNOSIS — N1831 Chronic kidney disease, stage 3a: Secondary | ICD-10-CM

## 2024-08-15 DIAGNOSIS — R7301 Impaired fasting glucose: Secondary | ICD-10-CM | POA: Diagnosis not present

## 2024-08-15 DIAGNOSIS — Z8546 Personal history of malignant neoplasm of prostate: Secondary | ICD-10-CM

## 2024-08-15 DIAGNOSIS — Z79899 Other long term (current) drug therapy: Secondary | ICD-10-CM | POA: Diagnosis not present

## 2024-08-15 DIAGNOSIS — K2270 Barrett's esophagus without dysplasia: Secondary | ICD-10-CM

## 2024-08-15 DIAGNOSIS — R1319 Other dysphagia: Secondary | ICD-10-CM

## 2024-08-15 LAB — COMPREHENSIVE METABOLIC PANEL WITH GFR
ALT: 24 U/L (ref 0–53)
AST: 21 U/L (ref 0–37)
Albumin: 4.5 g/dL (ref 3.5–5.2)
Alkaline Phosphatase: 47 U/L (ref 39–117)
BUN: 23 mg/dL (ref 6–23)
CO2: 28 meq/L (ref 19–32)
Calcium: 9.3 mg/dL (ref 8.4–10.5)
Chloride: 105 meq/L (ref 96–112)
Creatinine, Ser: 1.36 mg/dL (ref 0.40–1.50)
GFR: 50.52 mL/min — ABNORMAL LOW (ref >60.00)
Glucose, Bld: 84 mg/dL (ref 70–99)
Potassium: 4.3 meq/L (ref 3.5–5.1)
Sodium: 141 meq/L (ref 135–145)
Total Bilirubin: 0.7 mg/dL (ref 0.2–1.2)
Total Protein: 6.4 g/dL (ref 6.0–8.3)

## 2024-08-15 LAB — MICROALBUMIN / CREATININE URINE RATIO
Creatinine,U: 318.5 mg/dL
Microalb Creat Ratio: 7 mg/g (ref 0.0–30.0)
Microalb, Ur: 2.2 mg/dL — ABNORMAL HIGH (ref 0.0–1.9)

## 2024-08-15 LAB — CBC WITH DIFFERENTIAL/PLATELET
Basophils Absolute: 0.1 K/uL (ref 0.0–0.1)
Basophils Relative: 0.9 % (ref 0.0–3.0)
Eosinophils Absolute: 0.2 K/uL (ref 0.0–0.7)
Eosinophils Relative: 3.2 % (ref 0.0–5.0)
HCT: 43.3 % (ref 39.0–52.0)
Hemoglobin: 14.3 g/dL (ref 13.0–17.0)
Lymphocytes Relative: 14.3 % (ref 12.0–46.0)
Lymphs Abs: 0.8 K/uL (ref 0.7–4.0)
MCHC: 33.1 g/dL (ref 30.0–36.0)
MCV: 92.8 fl (ref 78.0–100.0)
Monocytes Absolute: 0.3 K/uL (ref 0.1–1.0)
Monocytes Relative: 5.9 % (ref 3.0–12.0)
Neutro Abs: 4.5 K/uL (ref 1.4–7.7)
Neutrophils Relative %: 75.7 % (ref 43.0–77.0)
Platelets: 260 K/uL (ref 150.0–400.0)
RBC: 4.67 Mil/uL (ref 4.22–5.81)
RDW: 13.5 % (ref 11.5–15.5)
WBC: 5.9 K/uL (ref 4.0–10.5)

## 2024-08-15 LAB — LIPID PANEL
Cholesterol: 141 mg/dL (ref 0–200)
HDL: 76.1 mg/dL (ref >39.00)
LDL Cholesterol: 44 mg/dL (ref 0–99)
NonHDL: 64.6
Total CHOL/HDL Ratio: 2
Triglycerides: 102 mg/dL (ref 0.0–149.0)
VLDL: 20.4 mg/dL (ref 0.0–40.0)

## 2024-08-15 LAB — LDL CHOLESTEROL, DIRECT: Direct LDL: 47 mg/dL

## 2024-08-15 LAB — TSH: TSH: 3.57 u[IU]/mL (ref 0.35–5.50)

## 2024-08-15 LAB — HEMOGLOBIN A1C: Hgb A1c MFr Bld: 5.9 % (ref 4.6–6.5)

## 2024-08-15 MED ORDER — LOSARTAN POTASSIUM 100 MG PO TABS: 100.0000 mg | ORAL_TABLET | Freq: Every day | ORAL | 1 refills | Status: AC

## 2024-08-15 MED ORDER — AMLODIPINE BESYLATE 5 MG PO TABS
5.0000 mg | ORAL_TABLET | Freq: Every day | ORAL | 3 refills | Status: AC
Start: 2024-08-15 — End: ?

## 2024-08-15 MED ORDER — ROSUVASTATIN CALCIUM 10 MG PO TABS: 10.0000 mg | ORAL_TABLET | Freq: Every day | ORAL | 1 refills | Status: AC

## 2024-08-15 NOTE — Assessment & Plan Note (Signed)
 Well controlled on current regimen. Renal function low but stable, no changes today.

## 2024-08-15 NOTE — Assessment & Plan Note (Signed)
 PSA was checked by [facility]  last week and < 0.015

## 2024-08-15 NOTE — Assessment & Plan Note (Addendum)
 GFR has been < 60 ml/min since April   he has not stopped meloxicam   as directed but stopped tylenol  instead  Will recheck after one month of meloxicam  suspension   Lab Results  Component Value Date   CREATININE 1.36 08/15/2024

## 2024-08-15 NOTE — Assessment & Plan Note (Signed)
 Reviewed last GI note from [facility] July 2024.  He  was  due for EGD last month,  and had a swallow eval last year  for suspected hiatal hernia  results not in chart. Given his subacute development of  dysphagia for solids for the past 6 months.  DG esophagus  has been ordered and  GI referral made

## 2024-08-15 NOTE — Assessment & Plan Note (Signed)
 Diagnosis has now been called into question .  He has no symptoms despite suspension of methotrexate  for the past 6 months

## 2024-08-15 NOTE — Progress Notes (Unsigned)
 Subjective:  Patient ID: Julian Pineda, male    DOB: [birth year]  Age: 78 y.o. MRN: [id number]  CC: The primary encounter diagnosis was Long-term use of high-risk medication. Diagnoses of Essential hypertension, Hyperlipidemia, unspecified hyperlipidemia type, Impaired fasting glucose, and History of prostate cancer were also pertinent to this visit.   HPI MIKAIL Pineda presents for  Chief Complaint  Patient presents with  . Medical Management of Chronic Issues    6 month follow up    1) DECREASED GFR:  overdue for follow up on GFR < 60 noted in April/May.   Advised to stop NSAID.  However he is  Still taking meloxicam .  Stopped the tylenol    had labs done 2 weeks ago by [facility]  rheumatology [place] and reportedly everything was in  range  but not available for me to review .  Patient has not taken methotrexate  in over a year (patient self discontinued due to concerns about skin CA recurrence suggested by Dr Dela ) .  Arthritis has not returned.  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    5) Nocturia  but not nightly,  uses liquid IV    6) dysphagia for large pills and some foods (bread/meat) for the past 6 months .Takes protonix .  Had a swallow eval 2 years ago for unclear reasons.  Has chronic daily nausea  brought on by eating breakfast and relieved by BM    Outpatient Medications Prior to Visit  Medication Sig Dispense Refill  . acetaminophen  (TYLENOL ) 650 MG CR tablet Take 1,300 mg by mouth every 8 (eight) hours as needed for pain. Before or after playing golf    . amLODipine  (NORVASC ) 5 MG tablet TAKE 1 TABLET BY MOUTH ONCE DAILY 90 tablet 3  . Ascorbic Acid  (VITAMIN C) 1000 MG tablet Take 1,000 mg by mouth daily.    . calcium   carbonate (TUMS) 500 MG chewable tablet 1 tablet Orally Four times a day    . cholecalciferol  (VITAMIN D ) 1000 units tablet Take 1,000 Units by mouth every morning.     . ezetimibe  (ZETIA ) 10 MG tablet Take 1 tablet (10 mg total) by mouth daily. 90 tablet 3  . fluticasone  (FLONASE ) 50 MCG/ACT nasal spray 2 sprays as needed.    . folic acid  (FOLVITE ) 1 MG tablet TAKE 1 TABLET EVERY DAY 90 tablet 0  . hydrOXYzine  (ATARAX ) 10 MG tablet TAKE ONE TABLET THREE TIMES A DAY AS NEEDED FOR ANXIETY 90 tablet 2  . losartan  (COZAAR ) 100 MG tablet Take 1 tablet (100 mg total) by mouth daily. 90 tablet 1  . meloxicam  (MOBIC ) 15 MG tablet Take 15 mg by mouth daily.    . metroNIDAZOLE (METROGEL) 0.75 % gel Apply 1 Application topically 2 (two) times daily.    . Misc Natural Products (IBEROGAST PO) Take 1 tablet by mouth 3 (three) times daily before meals. (Patient taking differently: Take 1 tablet by mouth 2 (two) times daily before a meal.)    . ondansetron  (ZOFRAN  ODT) 4 MG disintegrating tablet Take 1 tablet (4 mg total) by mouth every 8 (eight) hours as needed for nausea or vomiting. 20 tablet 0  . pantoprazole  (PROTONIX ) 40 MG tablet  Take 40 mg by mouth daily.    . rosuvastatin  (CRESTOR ) 10 MG tablet Take 1 tablet (10 mg total) by mouth daily. 90 tablet 1  . sildenafil  (VIAGRA ) 100 MG tablet Take 100 mg by mouth daily as needed.    SABRA UNABLE TO FIND Corner Crack Cream Apply to corners of mouth qid     No facility-administered medications prior to visit.    Review of Systems;  Patient denies headache, fevers, malaise, unintentional weight loss, skin rash, eye pain, sinus congestion and sinus pain, sore throat, dysphagia,  hemoptysis , cough, dyspnea, wheezing, chest pain, palpitations, orthopnea, edema, abdominal pain, nausea, melena, diarrhea, constipation, flank pain, dysuria, hematuria, urinary  Frequency, nocturia, numbness, tingling, seizures,  Focal weakness, Loss of consciousness,  Tremor, insomnia,  depression, anxiety, and suicidal ideation.      Objective:  BP 118/64   Pulse 75   Ht 5' 10 (1.778 m)   Wt 194 lb (88 kg)   SpO2 95%   BMI 27.84 kg/m   BP Readings from Last 3 Encounters:  08/15/24 118/64  06/04/24 122/60  02/15/24 124/60    Wt Readings from Last 3 Encounters:  08/15/24 194 lb (88 kg)  08/01/24 186 lb (84.4 kg)  06/04/24 191 lb (86.6 kg)    Physical Exam Vitals reviewed.  Constitutional:      General: He is not in acute distress.    Appearance: Normal appearance. He is normal weight. He is not ill-appearing, toxic-appearing or diaphoretic.  HENT:     Head: Normocephalic.  Eyes:     General: No scleral icterus.       Right eye: No discharge.        Left eye: No discharge.     Conjunctiva/sclera: Conjunctivae normal.  Cardiovascular:     Rate and Rhythm: Normal rate and regular rhythm.     Heart sounds: Normal heart sounds.  Pulmonary:     Effort: Pulmonary effort is normal. No respiratory distress.     Breath sounds: Normal breath sounds.  Musculoskeletal:        General: Normal range of motion.     Cervical back: Normal range of motion.  Skin:    General: Skin is warm and dry.  Neurological:     General: No focal deficit present.     Mental Status: He is alert and oriented to person, place, and time. Mental status is at baseline.  Psychiatric:        Mood and Affect: Mood normal.        Behavior: Behavior normal.        Thought Content: Thought content normal.        Judgment: Judgment normal.    Lab Results  Component Value Date   HGBA1C 5.9 09/19/2023   HGBA1C 5.0 09/07/2021   HGBA1C 5.4 02/21/2018    Lab Results  Component Value Date   CREATININE 1.30 03/21/2024   CREATININE 1.32 02/15/2024   CREATININE 1.15 09/19/2023    Lab Results  Component Value Date   WBC 6.5 09/19/2023   HGB 14.2 09/19/2023   HCT 43.2 09/19/2023   PLT 249.0 09/19/2023   GLUCOSE 109 (H) 03/21/2024   CHOL 139 09/19/2023   TRIG 118.0 09/19/2023    HDL 63.90 09/19/2023   LDLDIRECT 40.0 03/15/2023   LDLCALC 51 09/19/2023   ALT 19 02/15/2024   AST 18 02/15/2024   NA 137 03/21/2024   K 3.9 03/21/2024   CL 104 03/21/2024   CREATININE 1.30 03/21/2024  BUN 22 03/21/2024   CO2 26 03/21/2024   TSH 3.19 09/19/2023   PSA 0.17 06/23/2020   INR 1.0 09/07/2021   HGBA1C 5.9 09/19/2023    DG UGI W DOUBLE CM (HD BA) Result Date: 05/18/2023 CLINICAL DATA:  Gastroesophageal reflux disease with hiatal hernia Pharyngoesophageal dysphagia Long-segment Barrett's esophagus EXAM: UPPER GI SERIES WITHOUT KUB TECHNIQUE: Routine upper GI series was performed with thin and thick barium. FLUOROSCOPY: Radiation Exposure Index (as provided by the fluoroscopic device): 6.9 mGy COMPARISON:  None Available. FINDINGS: UPPER GI SERIES: Examination of the esophagus demonstrated normal esophageal motility. Normal esophageal morphology without evidence of esophagitis or ulceration. No esophageal stricture, diverticula, or mass lesion. No evidence of hiatal hernia. Moderate-severe gastroesophageal reflux extending into the midthoracic esophagus. Examination of the stomach demonstrated normal rugal folds and areae gastricae. Gastric mucosa appeared unremarkable without evidence of ulceration, scarring, or mass lesion. Gastric motility and emptying was normal. Fluoroscopic examination of the duodenum demonstrates normal motility and morphology without evidence of ulceration or mass lesion. IMPRESSION: 1. Moderate-severe gastroesophageal reflux extending into the midthoracic esophagus. 2. Otherwise normal upper GI. Electronically Signed   By: Julaine Blanch M.D.   On: 05/18/2023 10:13    Assessment & Plan:  .Long-term use of high-risk medication  Essential hypertension  Hyperlipidemia, unspecified hyperlipidemia type  Impaired fasting glucose  History of prostate cancer     I spent 34 minutes on the day of this face to face encounter reviewing patient's  most recent  visit with cardiology,  nephrology,  and neurology,  prior relevant surgical and non surgical procedures, recent  labs and imaging studies, counseling on weight management,  reviewing the assessment and plan with patient, and post visit ordering and reviewing of  diagnostics and therapeutics with patient  .   Follow-up: No follow-ups on file.   Verneita LITTIE Kettering, MD

## 2024-08-15 NOTE — Assessment & Plan Note (Signed)
 LDL and triglycerides are at goal on current medications. He has no side effects and liver enzymes are normal. No changes today   Lab Results  Component Value Date   CHOL 141 08/15/2024   HDL 76.10 08/15/2024   LDLCALC 44 08/15/2024   LDLDIRECT 47.0 08/15/2024   TRIG 102.0 08/15/2024   CHOLHDL 2 08/15/2024   Lab Results  Component Value Date   ALT 24 08/15/2024   AST 21 08/15/2024   ALKPHOS 47 08/15/2024   BILITOT 0.7 08/15/2024

## 2024-08-15 NOTE — Patient Instructions (Signed)
  I am ordering a DG esophagus to evaluate your swallowing issues.   This is an x ray of your esophagus during different phases of swallowing.  You will be asked to drink a chalky substance and pictures will be taken during your swallowing to see if there is a stricture.  Please call [phone] to set this up at [facility], they will not allow us  to schedule it for you

## 2024-08-16 ENCOUNTER — Ambulatory Visit: Payer: Self-pay | Admitting: Internal Medicine

## 2024-08-23 ENCOUNTER — Other Ambulatory Visit

## 2024-09-17 ENCOUNTER — Ambulatory Visit: Payer: Self-pay

## 2024-09-17 DIAGNOSIS — K449 Diaphragmatic hernia without obstruction or gangrene: Secondary | ICD-10-CM | POA: Diagnosis not present

## 2024-09-17 DIAGNOSIS — K2270 Barrett's esophagus without dysplasia: Secondary | ICD-10-CM | POA: Diagnosis not present

## 2024-10-01 ENCOUNTER — Telehealth: Payer: Self-pay

## 2024-10-01 DIAGNOSIS — N289 Disorder of kidney and ureter, unspecified: Secondary | ICD-10-CM

## 2024-10-01 DIAGNOSIS — R944 Abnormal results of kidney function studies: Secondary | ICD-10-CM

## 2024-10-01 DIAGNOSIS — N1831 Chronic kidney disease, stage 3a: Secondary | ICD-10-CM

## 2024-10-01 NOTE — Addendum Note (Signed)
 Addended by: MARYLYNN VERNEITA CROME on: 10/01/2024 04:17 PM   Modules accepted: Orders

## 2024-10-01 NOTE — Telephone Encounter (Signed)
 Copied from CRM #[id number]. Topic: Clinical - Request for Lab/Test Order >> Oct 01, 2024  7:53 AM Eva FALCON wrote: Reason for CRM: Pt is requesting a lab for Kidney function. States he had one weeks ago and stopped ibuprofen and is requesting an order for a repeat first thing in the morning.

## 2024-10-01 NOTE — Telephone Encounter (Signed)
 Lab appt has been scheduled for tomorrow morning

## 2024-10-01 NOTE — Addendum Note (Signed)
 Addended by: Sloan Galentine on: 10/01/2024 03:45 PM   Modules accepted: Orders

## 2024-10-01 NOTE — Telephone Encounter (Signed)
 I have pended lab for your approval. If needed I will call to schedule lab appt.

## 2024-10-02 ENCOUNTER — Other Ambulatory Visit

## 2024-10-02 DIAGNOSIS — R944 Abnormal results of kidney function studies: Secondary | ICD-10-CM | POA: Diagnosis not present

## 2024-10-02 DIAGNOSIS — N289 Disorder of kidney and ureter, unspecified: Secondary | ICD-10-CM

## 2024-10-02 LAB — COMPREHENSIVE METABOLIC PANEL WITH GFR
ALT: 19 U/L (ref 0–53)
AST: 18 U/L (ref 0–37)
Albumin: 4.3 g/dL (ref 3.5–5.2)
Alkaline Phosphatase: 48 U/L (ref 39–117)
BUN: 19 mg/dL (ref 6–23)
CO2: 28 meq/L (ref 19–32)
Calcium: 9.5 mg/dL (ref 8.4–10.5)
Chloride: 103 meq/L (ref 96–112)
Creatinine, Ser: 1.02 mg/dL (ref 0.40–1.50)
GFR: 71.29 mL/min (ref >60.00)
Glucose, Bld: 99 mg/dL (ref 70–99)
Potassium: 4.1 meq/L (ref 3.5–5.1)
Sodium: 138 meq/L (ref 135–145)
Total Bilirubin: 0.8 mg/dL (ref 0.2–1.2)
Total Protein: 6.2 g/dL (ref 6.0–8.3)

## 2024-10-03 ENCOUNTER — Ambulatory Visit: Payer: Self-pay | Admitting: Internal Medicine

## 2024-10-03 NOTE — Assessment & Plan Note (Addendum)
 GFR has normalized with suspension of meloxicam   Lab Results  Component Value Date   CREATININE 1.02 10/02/2024

## 2025-08-06 ENCOUNTER — Ambulatory Visit
